# Patient Record
Sex: Female | Born: 1940 | Race: White | Hispanic: No | Smoking: Former smoker
Health system: Southern US, Community
[De-identification: ages and names within clinical notes are randomized; demographics above are authoritative.]

## PROBLEM LIST (undated history)

## (undated) DIAGNOSIS — I10 Essential (primary) hypertension: Secondary | ICD-10-CM

## (undated) DIAGNOSIS — K219 Gastro-esophageal reflux disease without esophagitis: Secondary | ICD-10-CM

## (undated) DIAGNOSIS — M8580 Other specified disorders of bone density and structure, unspecified site: Secondary | ICD-10-CM

## (undated) DIAGNOSIS — E119 Type 2 diabetes mellitus without complications: Secondary | ICD-10-CM

## (undated) DIAGNOSIS — C9110 Chronic lymphocytic leukemia of B-cell type not having achieved remission: Secondary | ICD-10-CM

## (undated) DIAGNOSIS — H269 Unspecified cataract: Secondary | ICD-10-CM

## (undated) DIAGNOSIS — C50919 Malignant neoplasm of unspecified site of unspecified female breast: Secondary | ICD-10-CM

## (undated) DIAGNOSIS — D72829 Elevated white blood cell count, unspecified: Secondary | ICD-10-CM

## (undated) HISTORY — DX: Essential (primary) hypertension: I10

## (undated) HISTORY — DX: Chronic lymphocytic leukemia of B-cell type not having achieved remission: C91.10

## (undated) HISTORY — DX: Unspecified cataract: H26.9

## (undated) HISTORY — DX: Gastro-esophageal reflux disease without esophagitis: K21.9

## (undated) HISTORY — DX: Other specified disorders of bone density and structure, unspecified site: M85.80

## (undated) HISTORY — PX: COLONOSCOPY: SHX174

## (undated) HISTORY — DX: Type 2 diabetes mellitus without complications: E11.9

## (undated) HISTORY — DX: Malignant neoplasm of unspecified site of unspecified female breast: C50.919

## (undated) HISTORY — PX: APPENDECTOMY: SHX54

## (undated) HISTORY — DX: Elevated white blood cell count, unspecified: D72.829

---

## 1981-12-02 DIAGNOSIS — C50919 Malignant neoplasm of unspecified site of unspecified female breast: Secondary | ICD-10-CM

## 1981-12-02 HISTORY — PX: AUGMENTATION MAMMAPLASTY: SUR837

## 1981-12-02 HISTORY — DX: Malignant neoplasm of unspecified site of unspecified female breast: C50.919

## 1981-12-02 HISTORY — PX: OTHER SURGICAL HISTORY: SHX169

## 1981-12-02 HISTORY — PX: MASTECTOMY: SHX3

## 2000-03-24 ENCOUNTER — Other Ambulatory Visit: Admission: RE | Admit: 2000-03-24 | Discharge: 2000-03-24 | Payer: Self-pay | Admitting: Gynecology

## 2000-04-04 ENCOUNTER — Encounter: Admission: RE | Admit: 2000-04-04 | Discharge: 2000-04-04 | Payer: Self-pay | Admitting: Gynecology

## 2000-04-04 ENCOUNTER — Encounter: Payer: Self-pay | Admitting: Gynecology

## 2005-04-01 ENCOUNTER — Ambulatory Visit: Payer: Self-pay | Admitting: Unknown Physician Specialty

## 2006-04-15 ENCOUNTER — Ambulatory Visit: Payer: Self-pay | Admitting: Internal Medicine

## 2006-08-20 IMAGING — MG UNKNOWN MG STUDY
1 series · 8 of 8 positions shown · non-contrast
Comparison: none

REASON FOR EXAM: History breast carcinoma
COMMENTS:

[R CC · right · 8 of 8 slices shown]
[im 1/8]
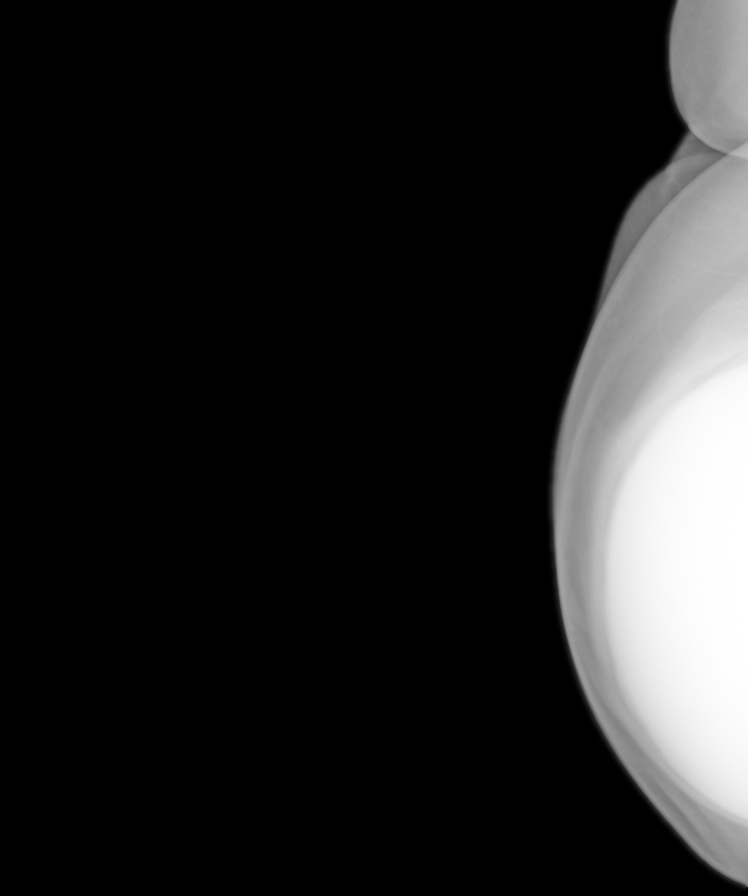
[im 2/8]
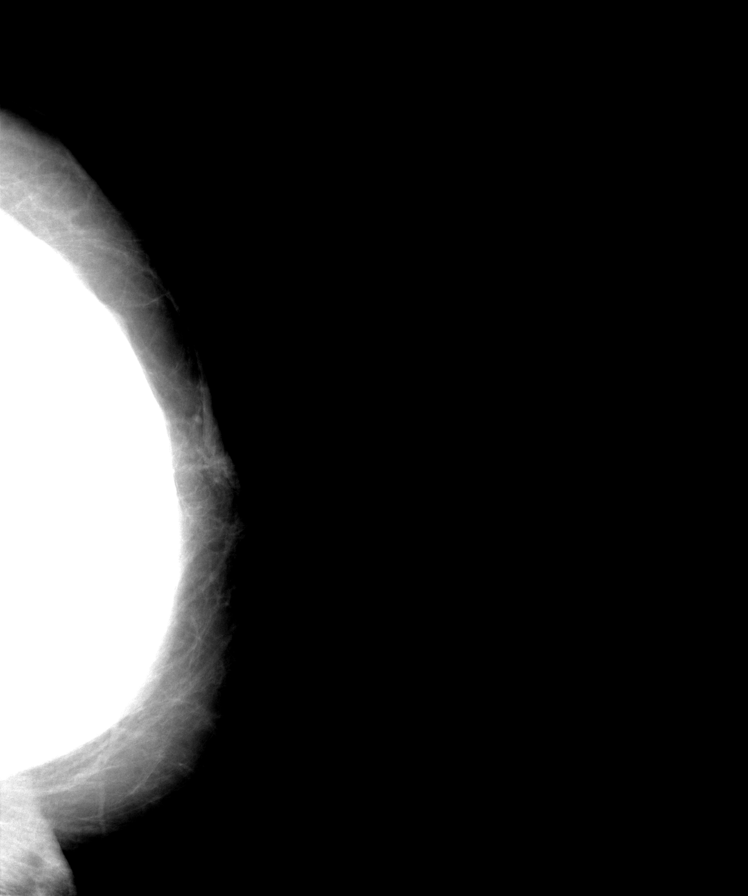
[im 3/8]
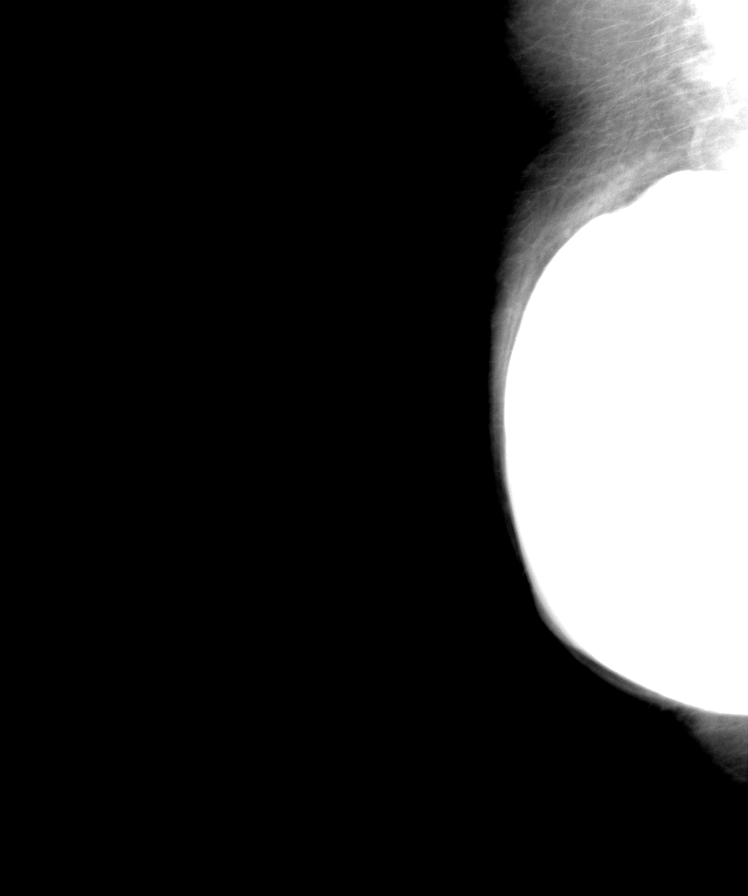
[im 4/8]
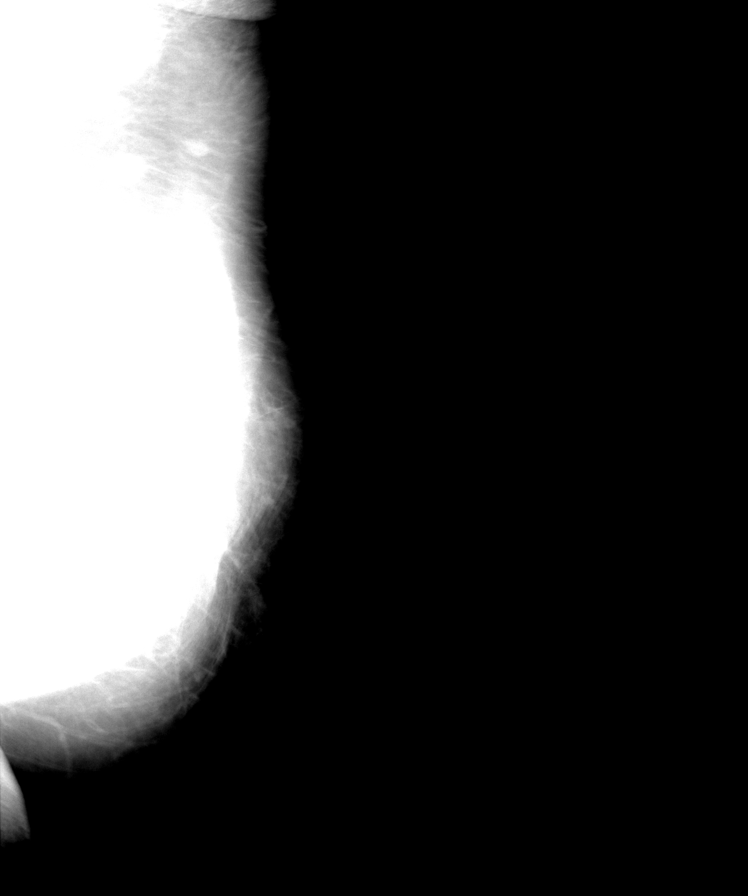
[im 5/8]
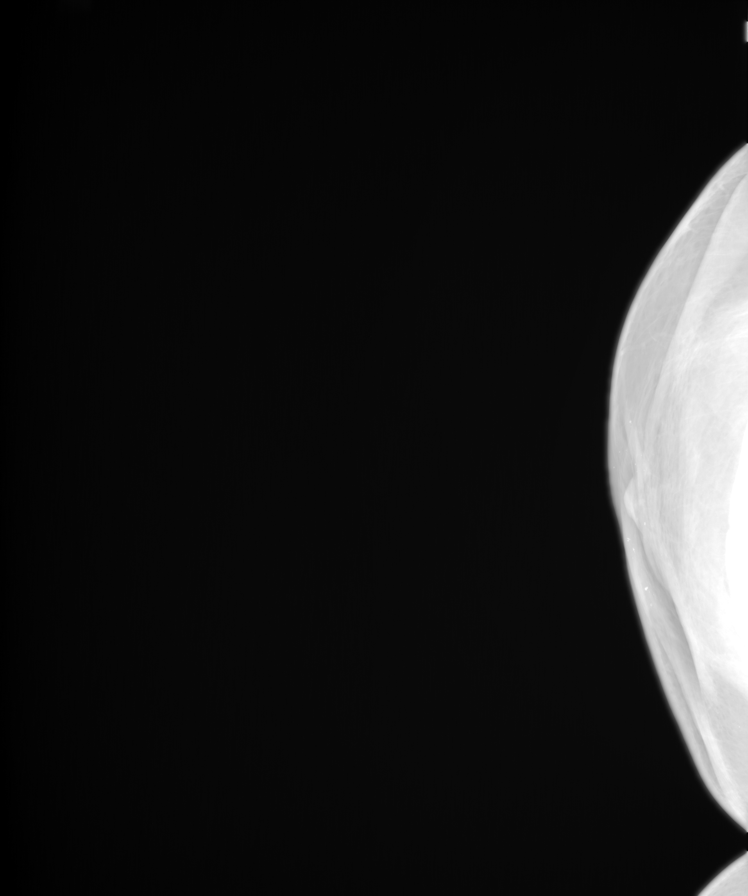
[im 6/8]
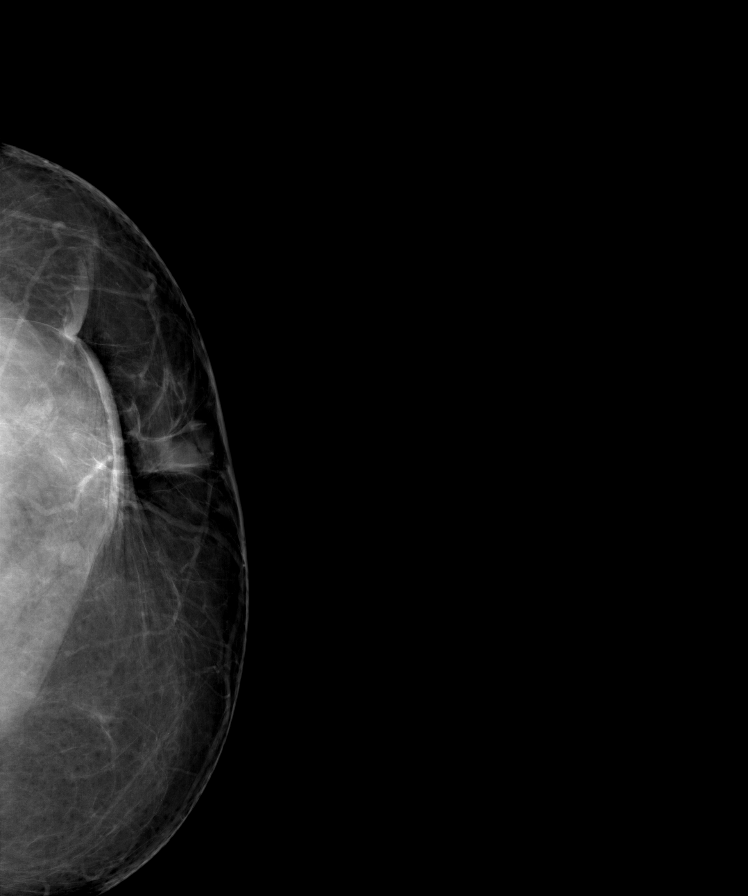
[im 7/8]
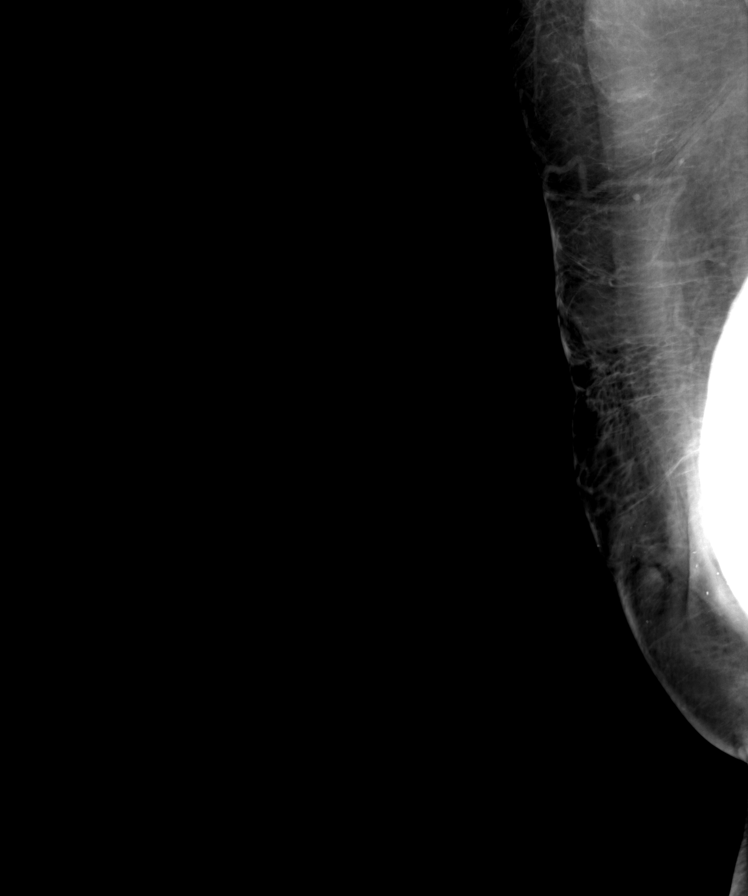
[im 8/8]
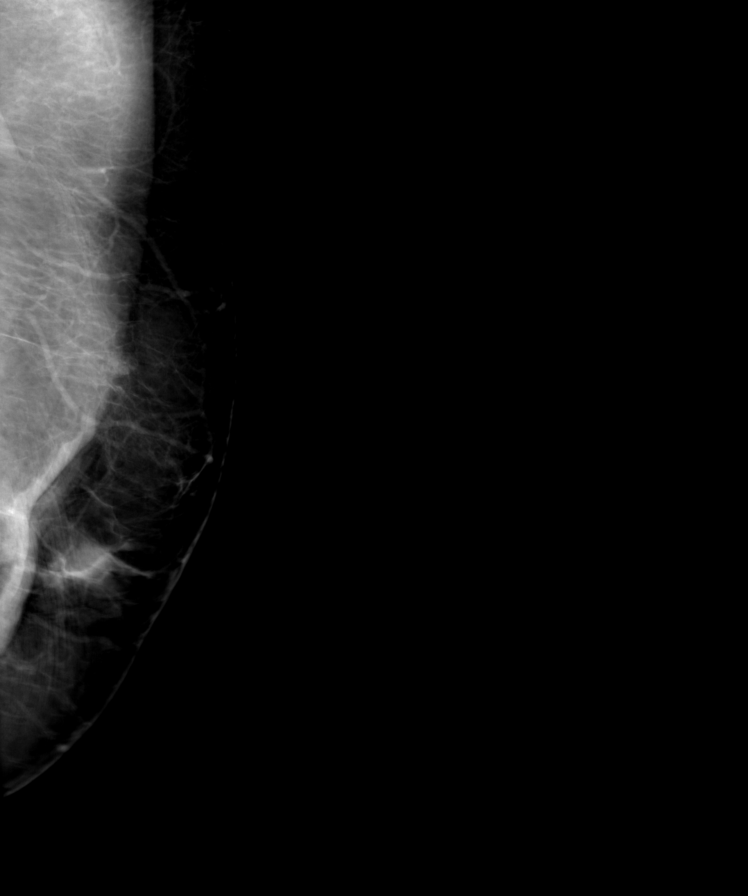

[8 of 8 positions shown; findings below may reference images not displayed]

PROCEDURE:     MAM - MAM DGTL DIAGNOSTIC MAMMO W/CAD  - April 17, 2006  [DATE]

RESULT:          The patient has undergone bilateral mastectomies with
implants and has a history of breast cancer.  No old studies are available
for comparison.

Implants are noted bilaterally.  Push-back and Arati views show minimal
density presumably in the area of the nipple on the LEFT.  No other
significant abnormality is seen.
IMPRESSION: Status post bilateral mastectomy with implants.  No
definite mammographic abnormality.  Please continue to encourage annual
mammographic followup.

BI-RADS:  Category 2 - Benign finding

A NEGATIVE MAMMOGRAM REPORT DOES NOT PRECLUDE BIOPSY OR OTHER EVALUATION OF
A CLINICALLY PALPABLE OR OTHERWISE SUSPICIOUS MASS OR LESION.  BREAST CANCER
MAY NOT BE DETECTED BY MAMMOGRAPHY IN UP TO 10% OF CASES.

## 2006-10-11 ENCOUNTER — Ambulatory Visit: Payer: Self-pay | Admitting: Internal Medicine

## 2007-02-15 IMAGING — MR MRI HEAD WITHOUT AND WITH CONTRAST
9 series · 48 of 48 positions shown · non-contrast
Comparison: none

REASON FOR EXAM: headaches     dizziness
COMMENTS:

[Series 2: t1_sag · axial · 10.0mm · 0.55mm/px · z∈[+0,+92]mm · 5 of 20 slices shown]
[im 1/20]
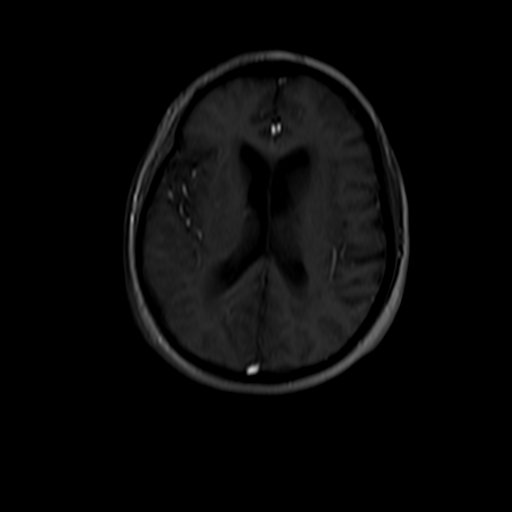
[im 5/20]
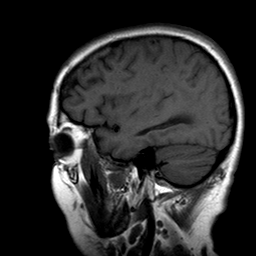
[im 10/20]
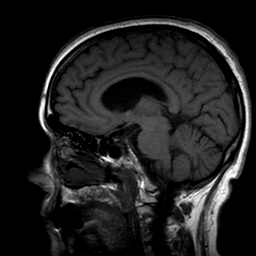
[im 15/20]
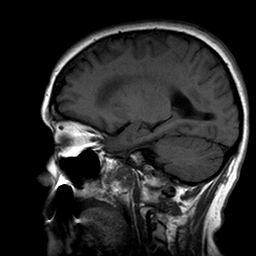
[im 20/20]
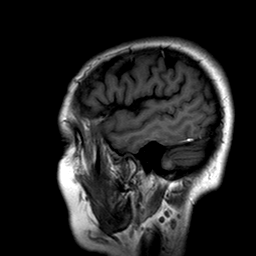

[Series 6: T1 · axial · 5.0mm · 0.90mm/px · z∈[-67,+88]mm · 6 of 24 slices shown (1 of 3)]
[im 1/24]
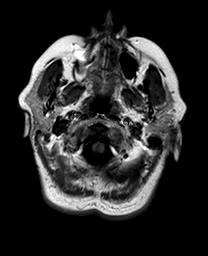
[im 5/24]
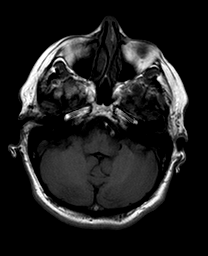
[im 10/24]
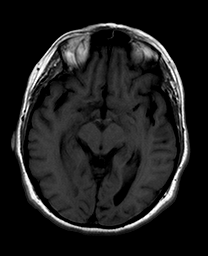
[im 14/24]
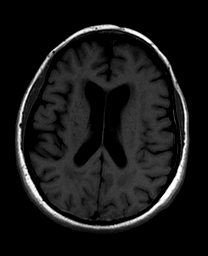
[im 19/24]
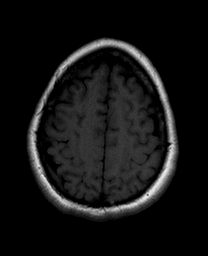
[im 24/24]
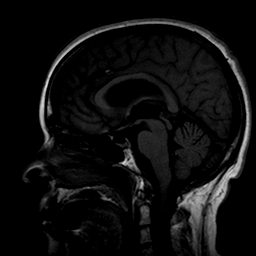

[Series 12: T2 · axial · 5.0mm · 0.45mm/px · z∈[-66,+88]mm · 5 of 24 slices shown]
[im 1/24]
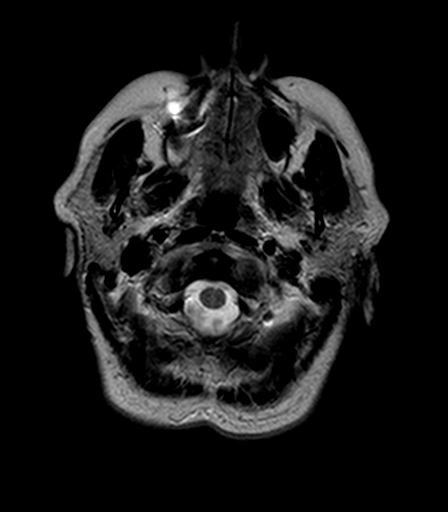
[im 6/24]
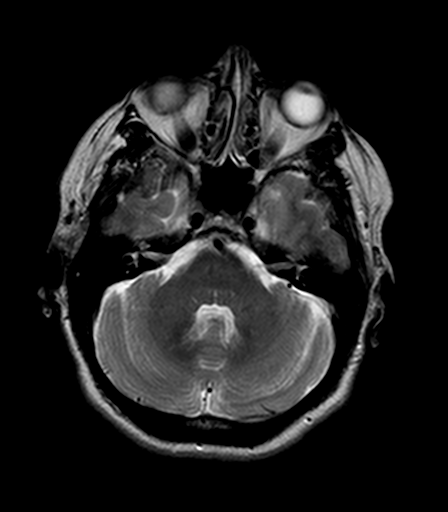
[im 12/24]
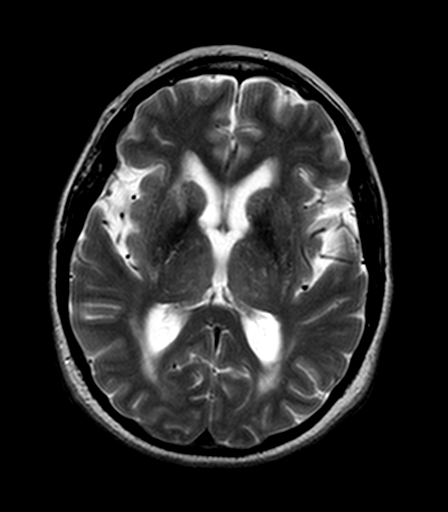
[im 18/24]
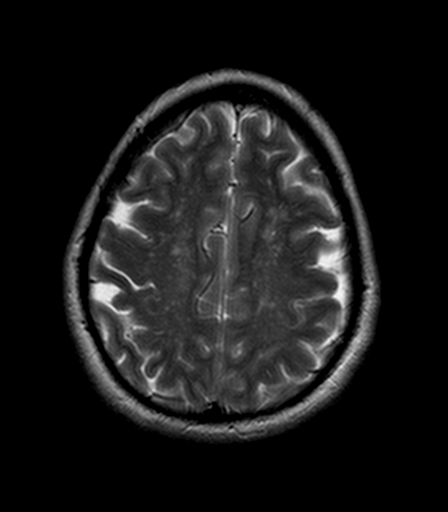
[im 24/24]
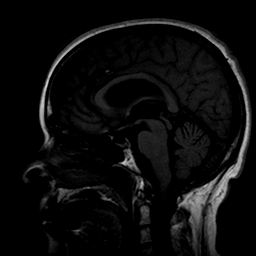

[Series 13: FLAIR · axial · 5.0mm · 0.90mm/px · z∈[-67,+88]mm · 5 of 24 slices shown]
[im 1/24]
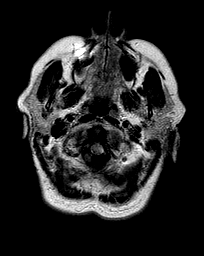
[im 6/24]
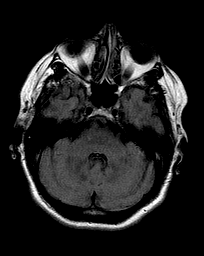
[im 12/24]
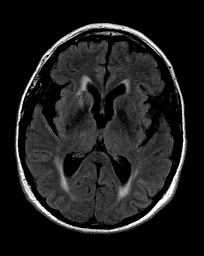
[im 18/24]
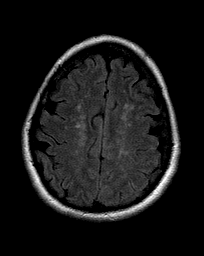
[im 24/24]
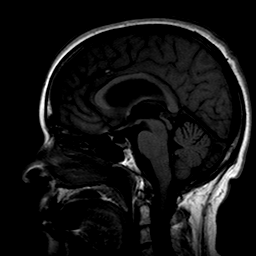

[Series 17: t1_cor · sagittal · 5.0mm · 0.90mm/px · 6 of 28 slices shown]
[im 1/28]
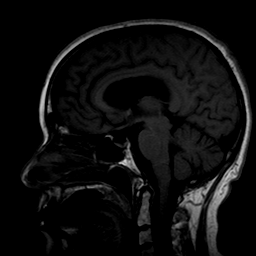
[im 6/28]
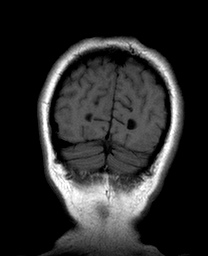
[im 11/28]
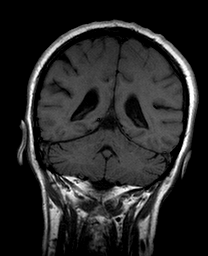
[im 17/28]
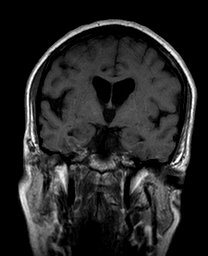
[im 22/28]
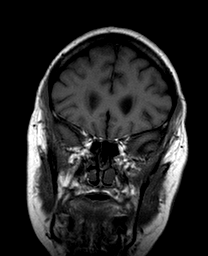
[im 28/28]
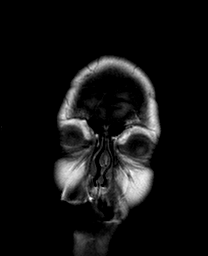

[Series 19: T1 · sagittal · 5.0mm · 0.90mm/px · 6 of 28 slices shown (2 of 3)]
[im 1/28]
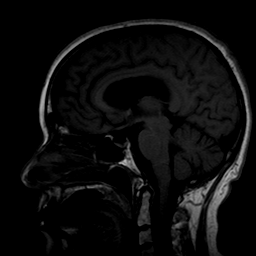
[im 6/28]
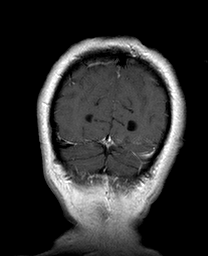
[im 11/28]
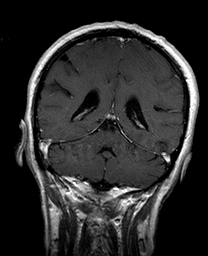
[im 17/28]
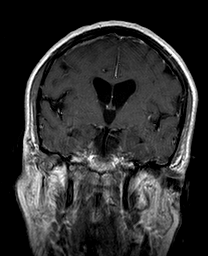
[im 22/28]
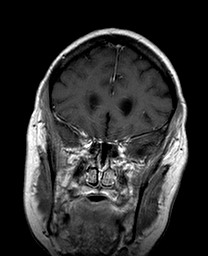
[im 28/28]
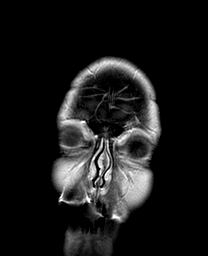

[Series 21: T1 · axial · 5.0mm · 0.45mm/px · z∈[-67,+88]mm · 5 of 24 slices shown (3 of 3)]
[im 1/24]
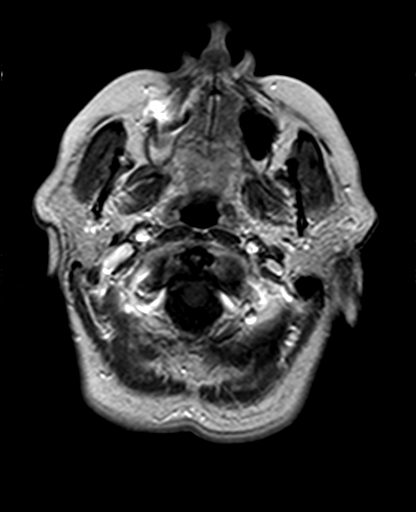
[im 6/24]
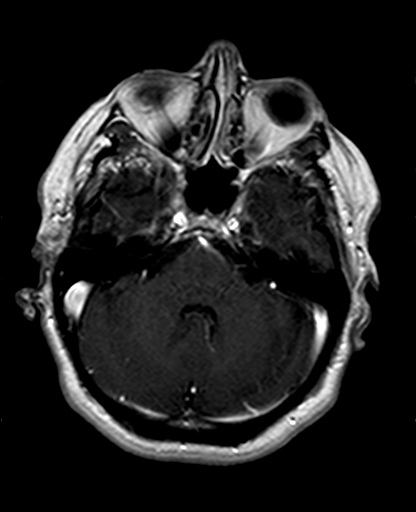
[im 12/24]
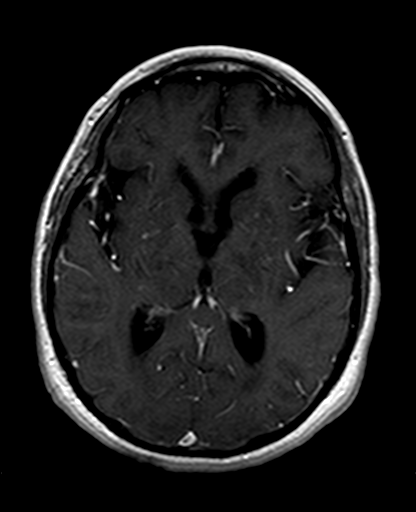
[im 18/24]
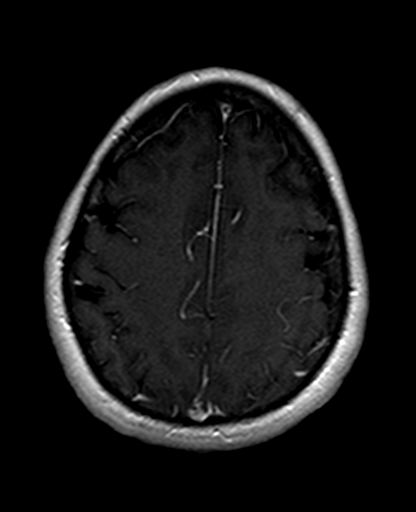
[im 24/24]
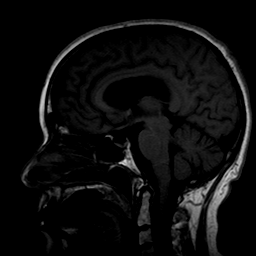

[Series 5003: ADC · axial · 5.0mm · 1.80mm/px · z∈[-59,+88]mm · 5 of 22 slices shown]
[im 1/22]
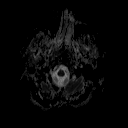
[im 6/22]
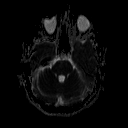
[im 11/22]
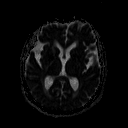
[im 16/22]
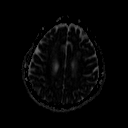
[im 22/22]
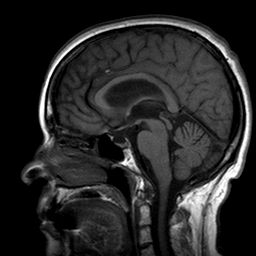

[Series 5004: DWI · axial · 5.0mm · 1.80mm/px · z∈[-59,+88]mm · 5 of 22 slices shown]
[im 1/22]
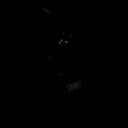
[im 6/22]
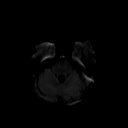
[im 11/22]
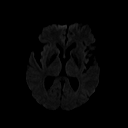
[im 16/22]
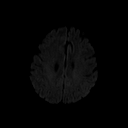
[im 22/22]
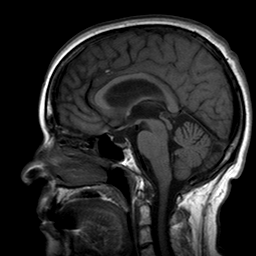

[48 of 48 positions shown; findings below may reference images not displayed]

PROCEDURE:     MR  - MR BRAIN WO/W CONTRAST  - October 11, 2006 [DATE]

RESULT:          Pre and post gadolinium MR imaging of the brain is
performed.  The patient has no prior similar study for comparison.

The diffusion-weighted images demonstrate no evidence of abnormal signal to
suggest an acute or subacute ischemic event.  The FLAIR-sequence images show
some punctate areas of increased signal to the RIGHT of midline in the pons
and in the periventricular and subcortical white matter regions.  These are
nonspecific.  These are also seen on the T2-weighted images, although not as
prominently.  These areas do not show significant decreased signal on the
T1-weighted images, and there is no evidence of abnormal enhancement
following the intravenous administration of gadolinium.  Again, there is no
evidence of hemorrhage or blood breakdown product signal.
IMPRESSION: Nonspecific T2 and FLAIR-sequence changes consistent
with chronic microvascular ischemic disease.  This is in the periventricular
white matter, subcortical white matter, and pontine region.

## 2007-06-01 ENCOUNTER — Ambulatory Visit: Payer: Self-pay | Admitting: Internal Medicine

## 2007-06-29 ENCOUNTER — Ambulatory Visit: Payer: Self-pay | Admitting: Cardiovascular Disease

## 2007-10-06 IMAGING — MG MAM DGTL DIAGNOSTIC MAMMO W/CAD
1 series · 8 of 8 positions shown · non-contrast
Comparison: none

REASON FOR EXAM: implants
COMMENTS:

PROCEDURE:     MAM - MAM DGTL DIAGNOSTIC MAMMO W/CAD  - June 01, 2007  [DATE]
RESULT:       Bilateral breast implants are present.  No parenchymal
abnormalities are identified.  Breast implants appear stable.  Small
axillary lymph nodes are present bilaterally.

[Series 4707: R CC · right · 8 of 10 slices shown]
[im 1/10]
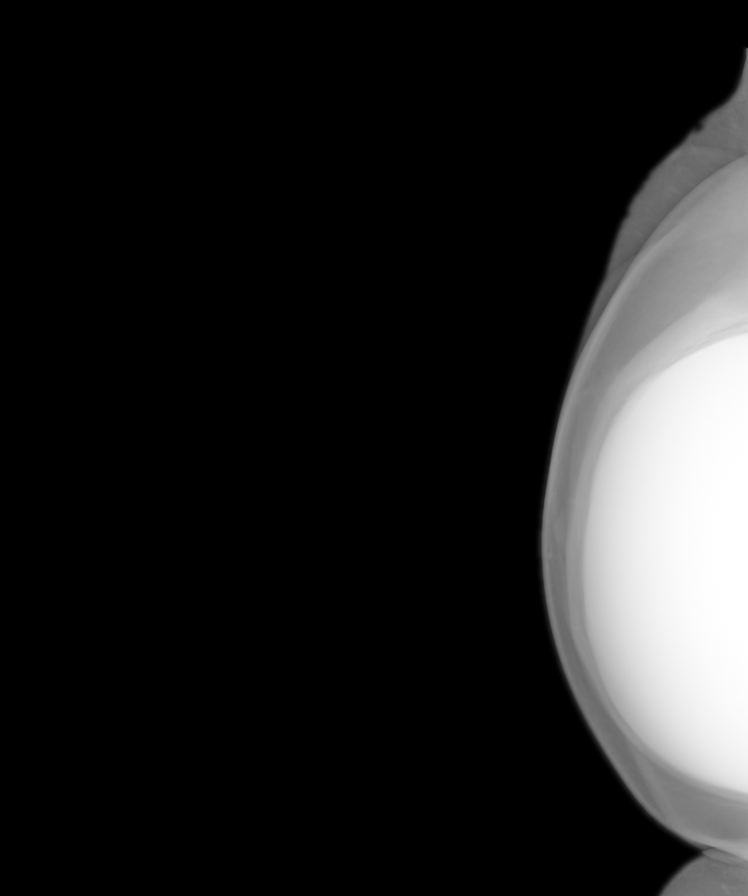
[im 2/10]
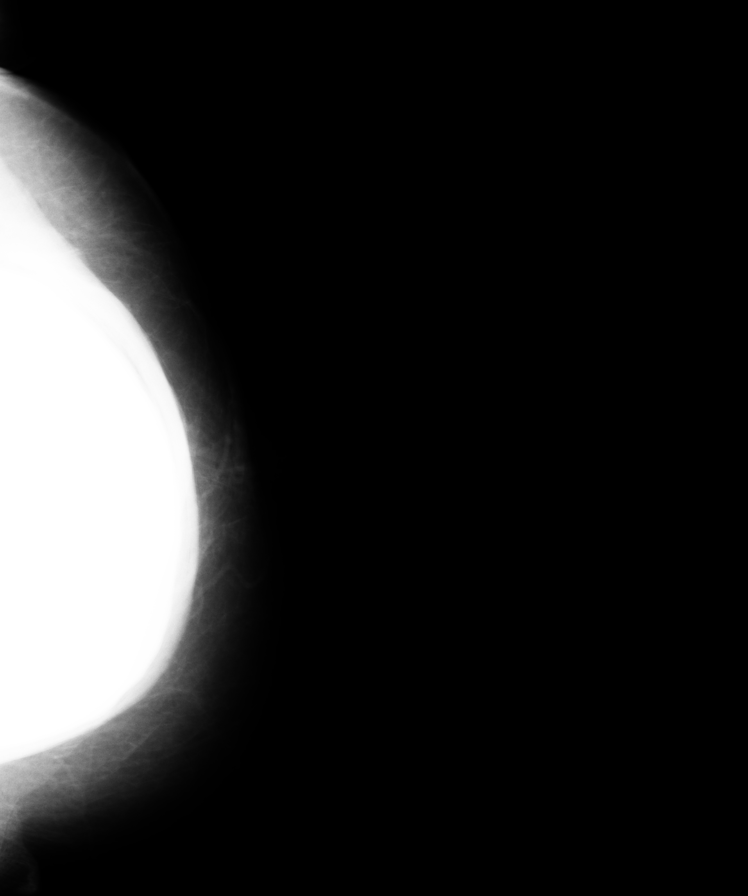
[im 3/10]
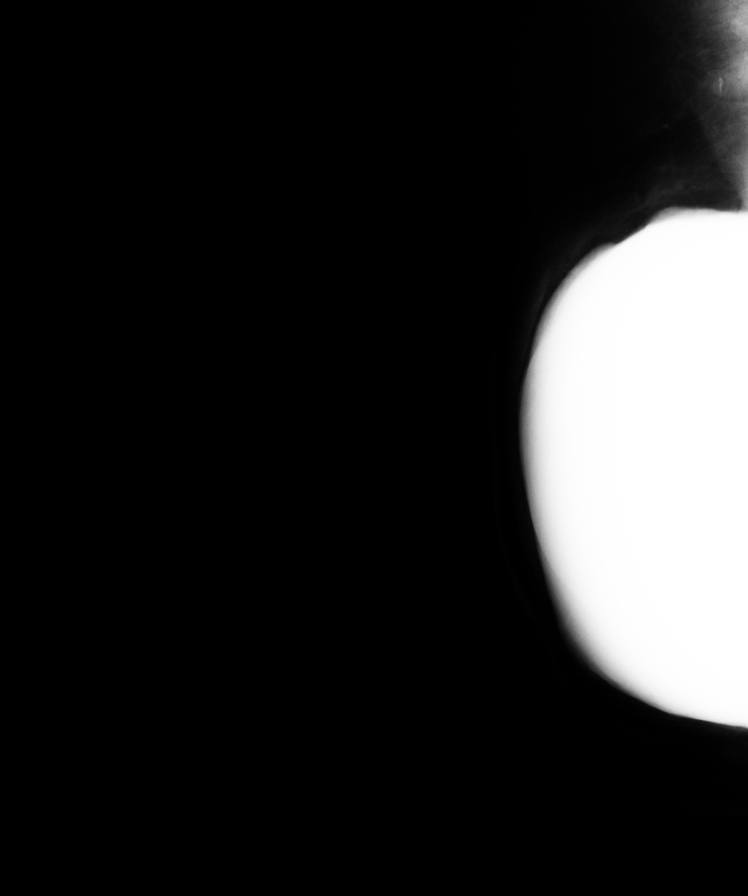
[im 4/10]
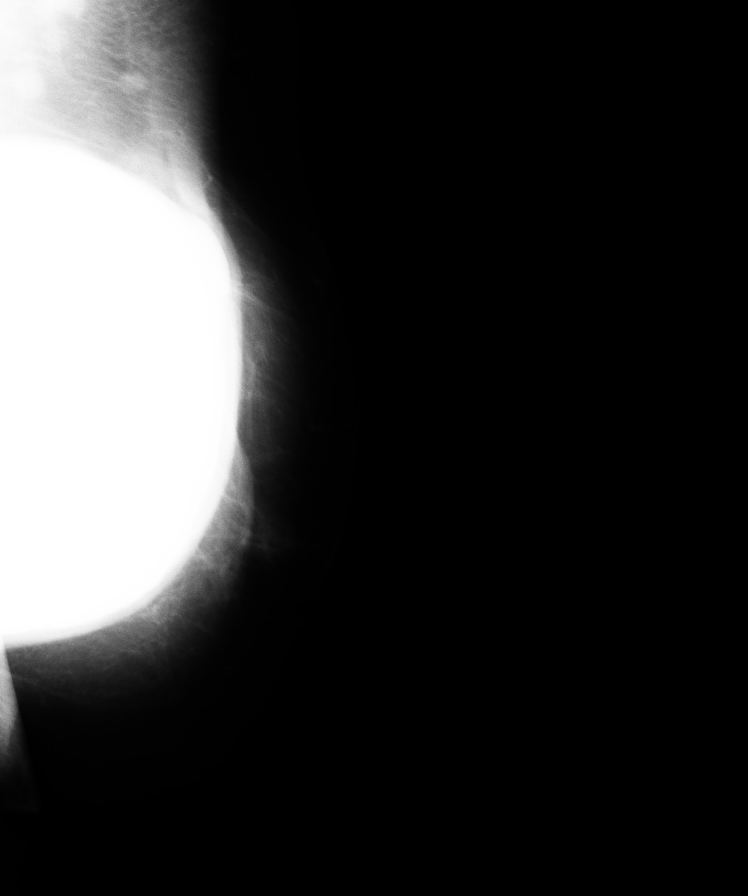
[im 6/10]
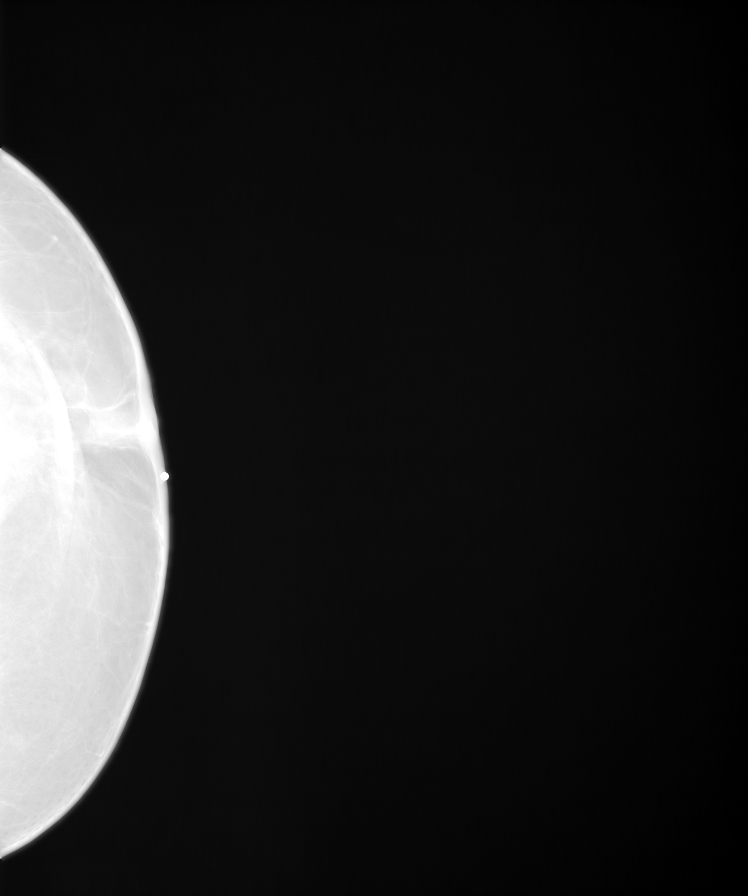
[im 7/10]
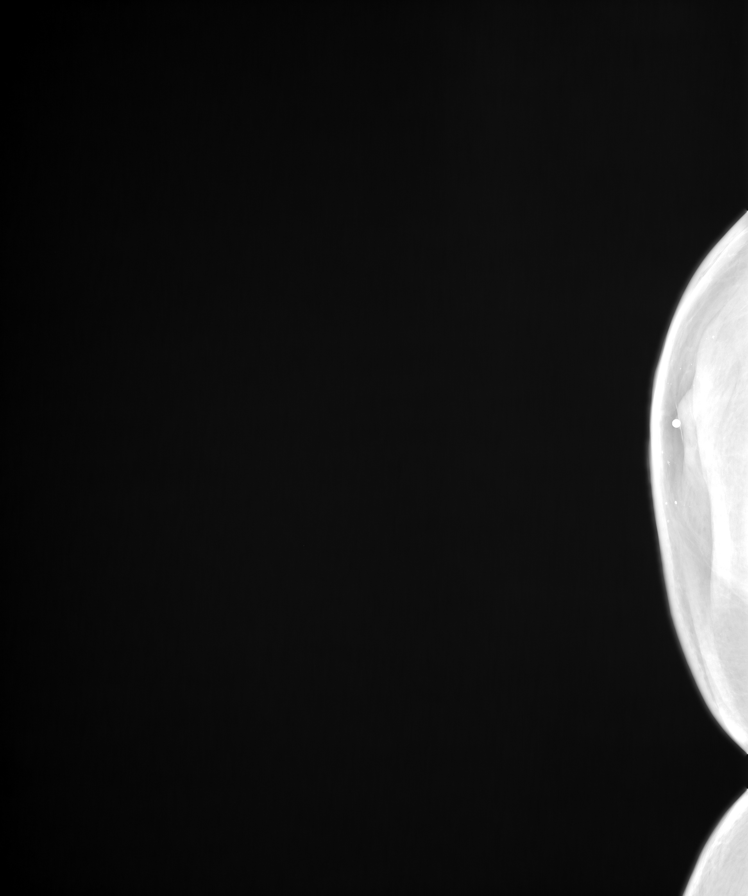
[im 8/10]
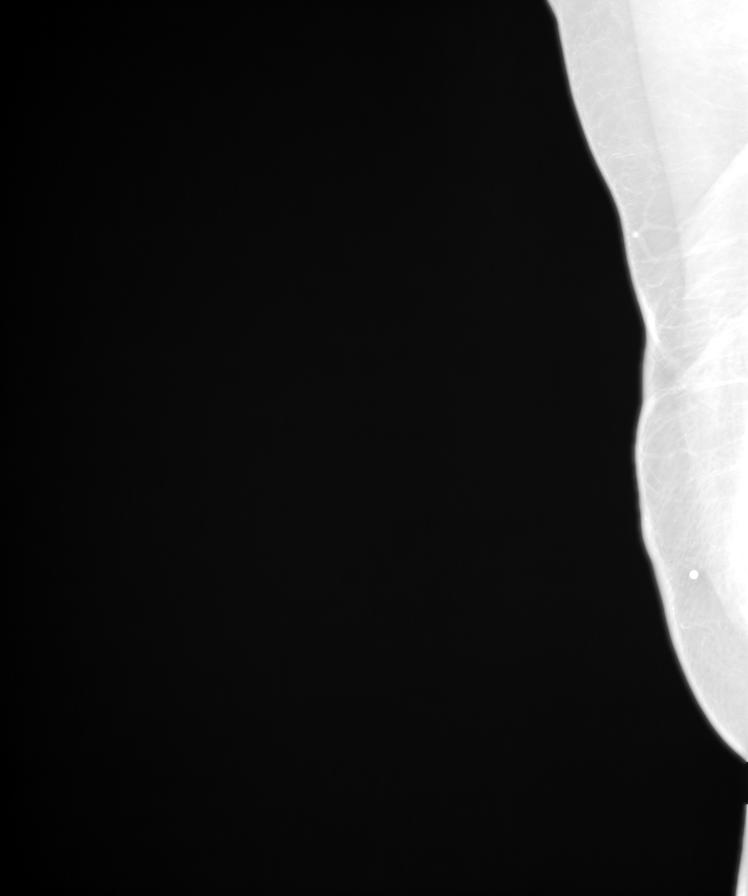
[im 10/10]
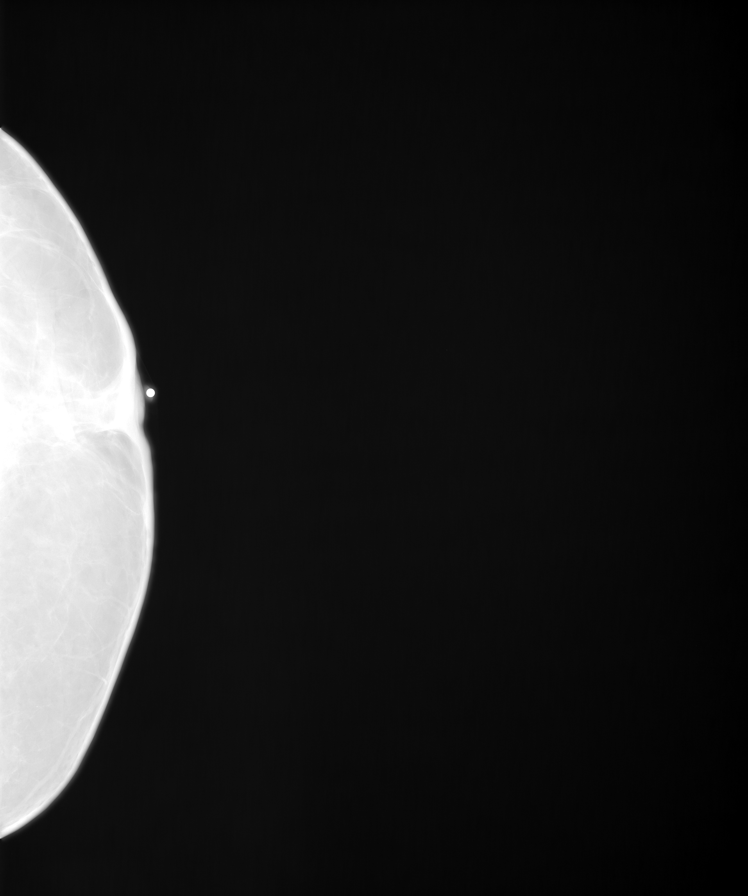

[8 of 8 positions shown; findings below may reference images not displayed]

IMPRESSION: 1.     Benign exam.
2.     Yearly follow up mammogram is suggested.
3.     BI-RADS:  Category 2-Benign Finding.

A NEGATIVE MAMMOGRAM REPORT DOES NOT PRECLUDE BIOPSY OR OTHER EVALUATION OF
A CLINICALLY PALPABLE OR OTHERWISE SUPSICIOUS MASS OR LESION.  BREAST CANCER
MAY NOT BE DETECTED BY MAMMOGRAPHY IN UP TO 10% OF CASES.

## 2008-08-09 ENCOUNTER — Ambulatory Visit: Payer: Self-pay | Admitting: Unknown Physician Specialty

## 2008-12-14 ENCOUNTER — Ambulatory Visit: Payer: Self-pay | Admitting: Internal Medicine

## 2009-04-10 ENCOUNTER — Ambulatory Visit: Payer: Self-pay | Admitting: Internal Medicine

## 2009-04-20 IMAGING — MG MAM DGTL DIAGNOSTIC MAMMO W/CAD
1 series · 8 of 8 positions shown · non-contrast
Comparison: none

REASON FOR EXAM: Left breast tenderness tiny nodule 7 oclock position
COMMENTS:

[R CC · right · 8 of 8 slices shown]
[im 1/8]
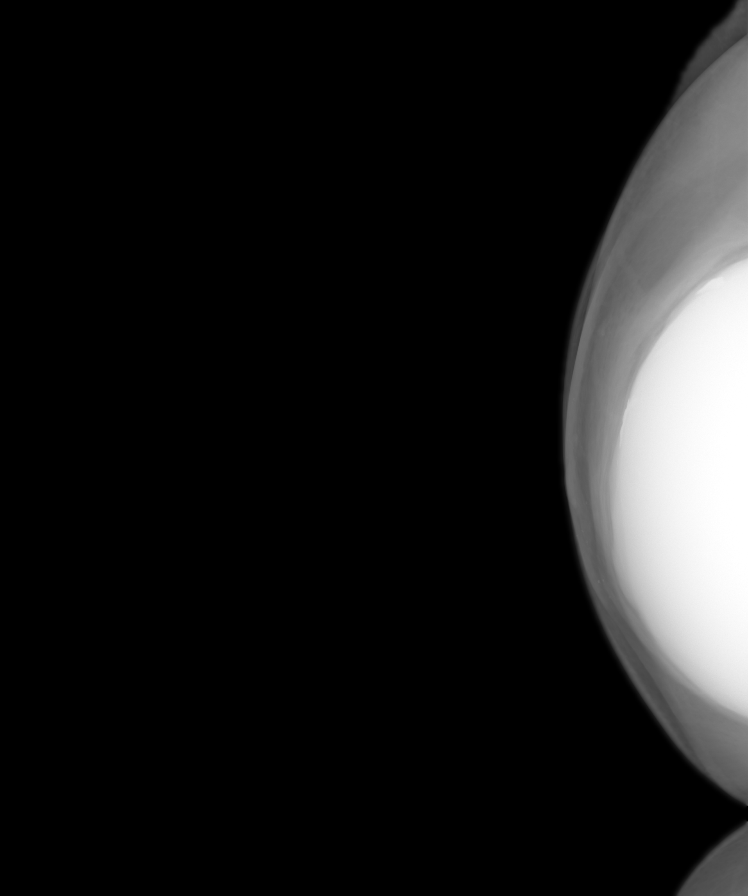
[im 2/8]
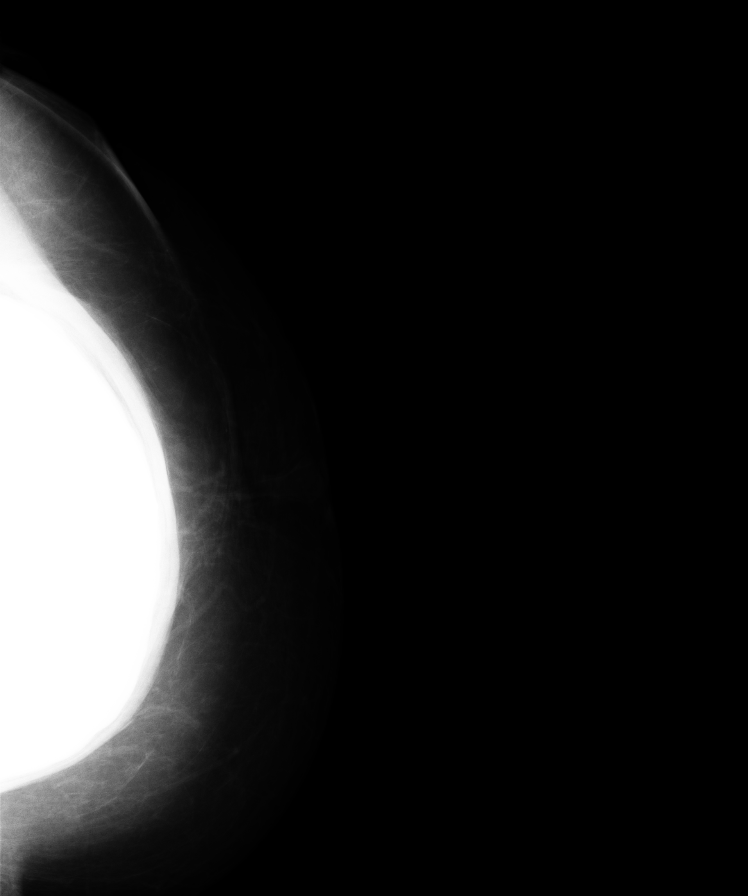
[im 3/8]
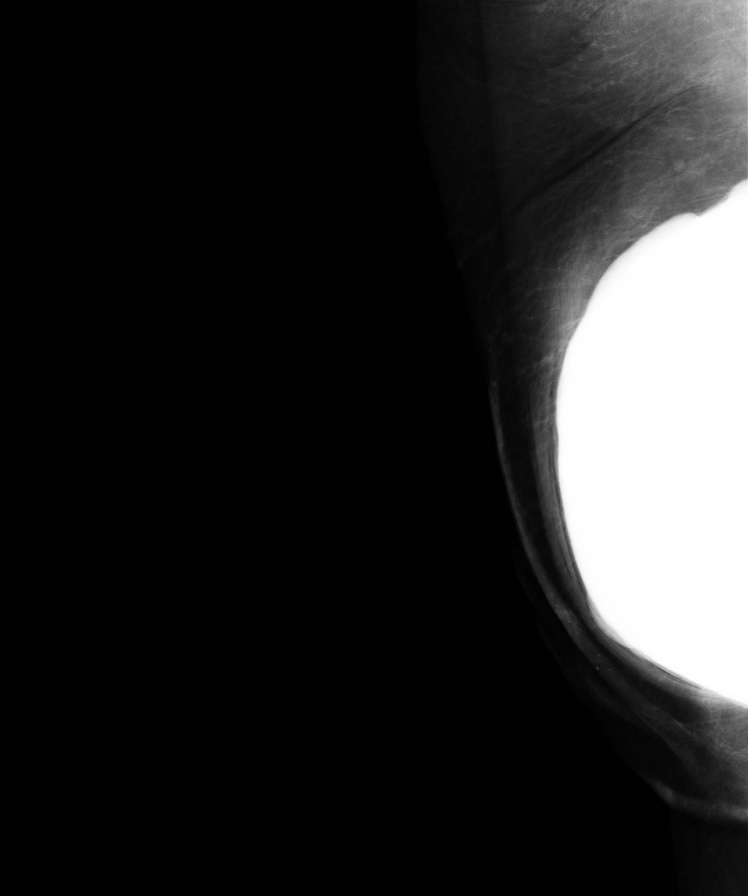
[im 4/8]
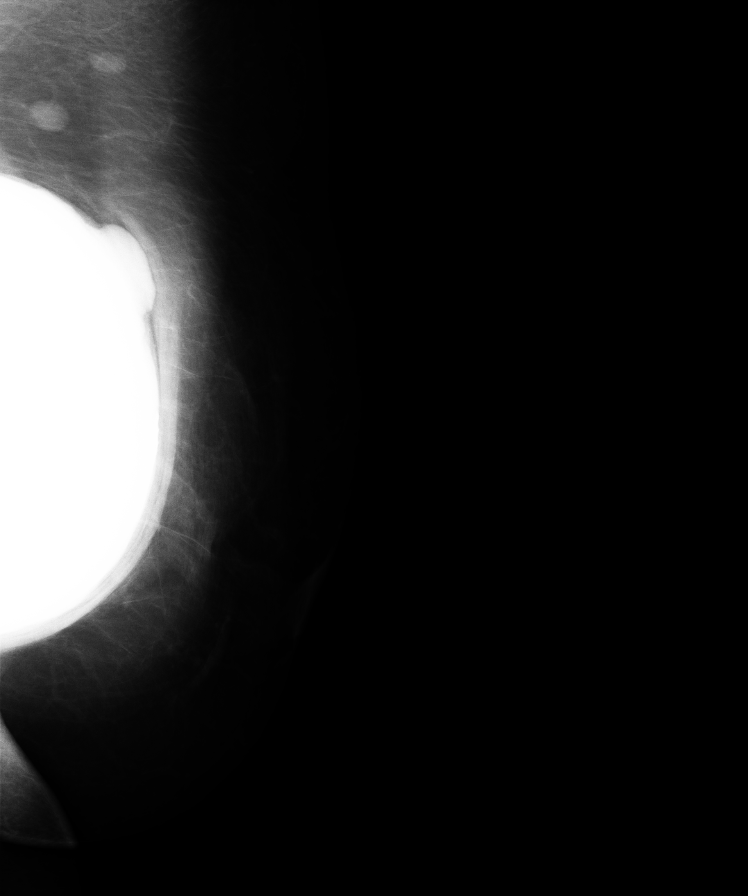
[im 5/8]
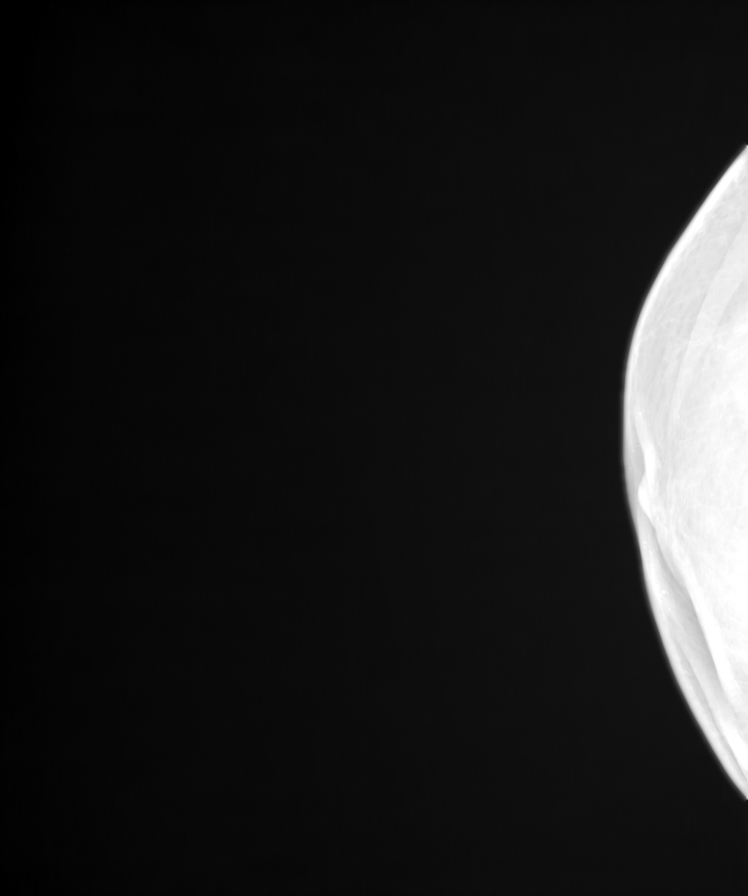
[im 6/8]
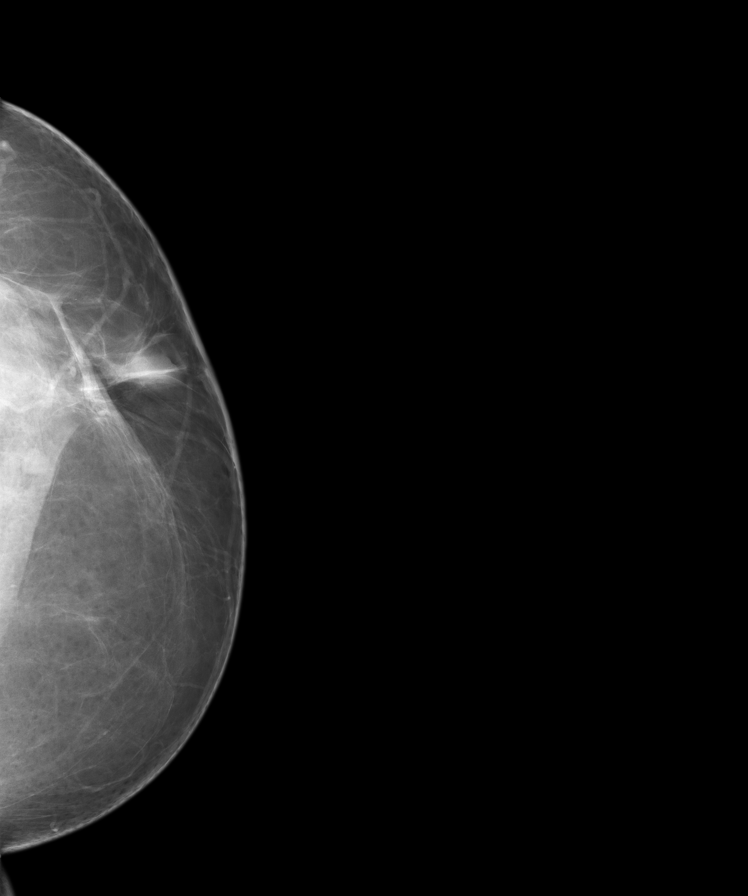
[im 7/8]
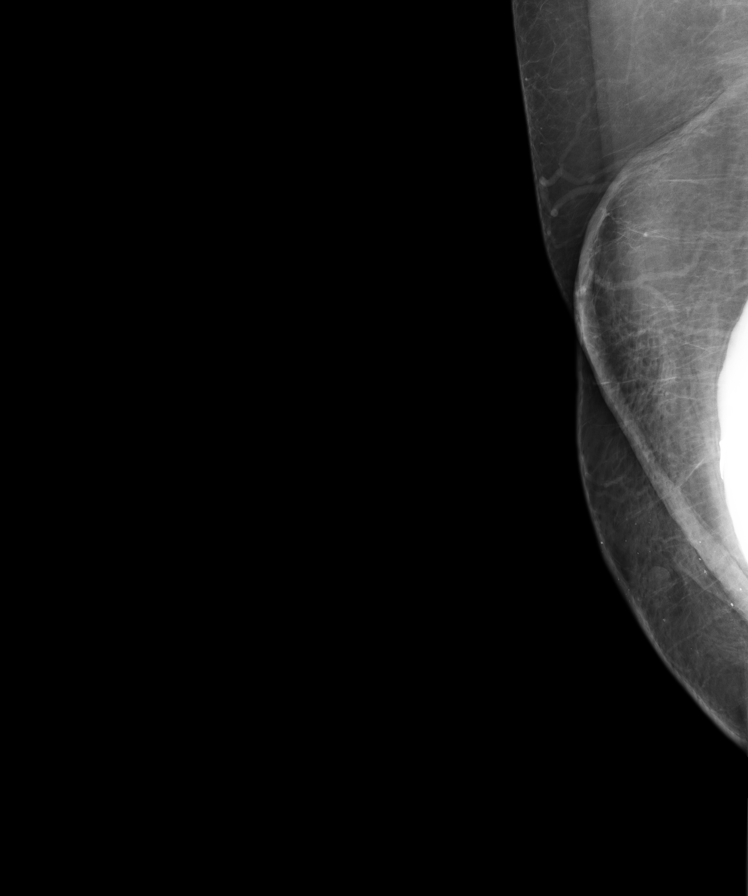
[im 8/8]
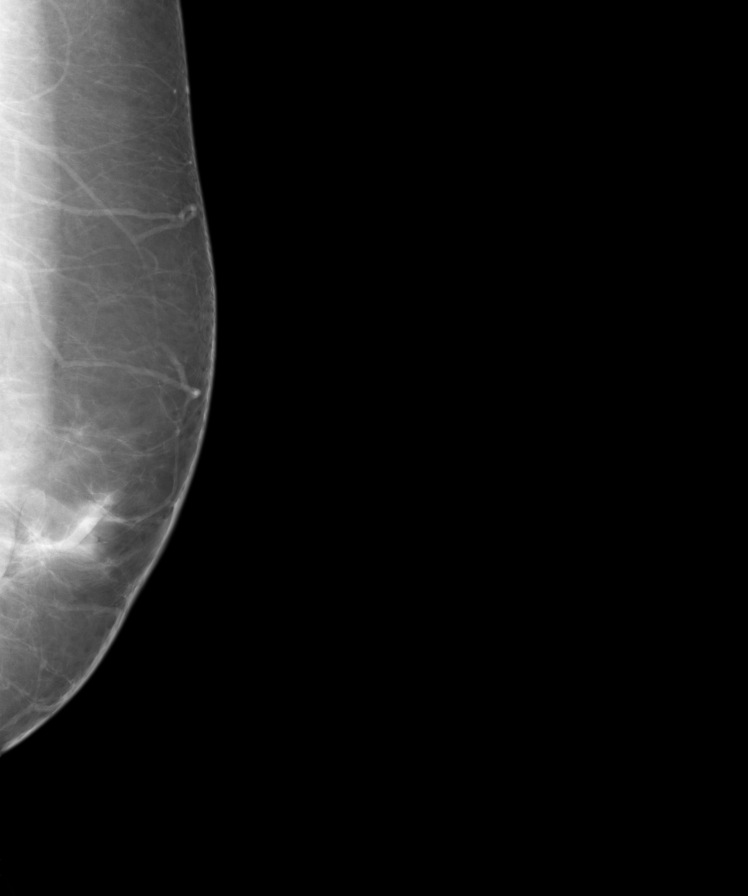

[8 of 8 positions shown; findings below may reference images not displayed]

PROCEDURE:     MAM - MAM DGTL DIAGNOSTIC MAMMO W/CAD  - December 14, 2008 [DATE]

RESULT:       Comparison is made to previous film screen images from
[REDACTED] dated 04/25/03 and to previous digital
exams dated 06/01/07.  Bilateral breast implants are present.  The LEFT
breast implant on the CC projection laterally shows some irregularity
possibly secondary to disruption of the implant.  Follow up and correlation
with physical findings is recommended.  Pushback or Arbouw view images show
no definite mammographic soft tissue abnormality within either breast.  No
malignant-appearing calcifications are evident.

Targeted ultrasound in the 7 to 9 o'clock region of the LEFT breast
demonstrates a somewhat elliptical area measuring 5.5 x 3.7 x 5.1 mm with
some internal echoes possibly debris. Does this correlate with the area of
possible capsular disruption? No definite through transmission is seen.
Clinical correlation and follow up is recommended.  No definite malignant
features are present.  Surgical consultation is recommended to evaluate for
capsular disruption.  6-month followup of the area of sonographic
abnormality is recommended.
IMPRESSION: 1.     BI-RADS: Category 3- Probably Benign Finding (interval follow up).
2.     Probable capsular disruption.
3.     Nonspecific predominantly cystic structure with internal echoes in
the LEFT breast which appears to be medial and not in the same area.  No
malignant features are present but followup is recommended. This is at the 9
o'clock region.

A NEGATIVE MAMMOGRAM REPORT DOES NOT PRECLUDE BIOPSY OR OTHER EVALUATION OF
A CLINICALLY PALPABLE OR OTHERWISE SUSPICIOUS MASS OR LESION.  BREAST CANCER
MAY NOT BE DETECTED BY MAMMOGRAPHY IN UP TO 10% OF CASES.

## 2009-04-20 IMAGING — US ULTRASOUND LEFT BREAST
1 series · 11 of 11 positions shown · non-contrast
Comparison: none

REASON FOR EXAM: Left breast tenderness tiny nodule 7 oclock position
COMMENTS:

[Series 1: ultrasound left breast · 11 of 11 slices shown]
[im 1/11]
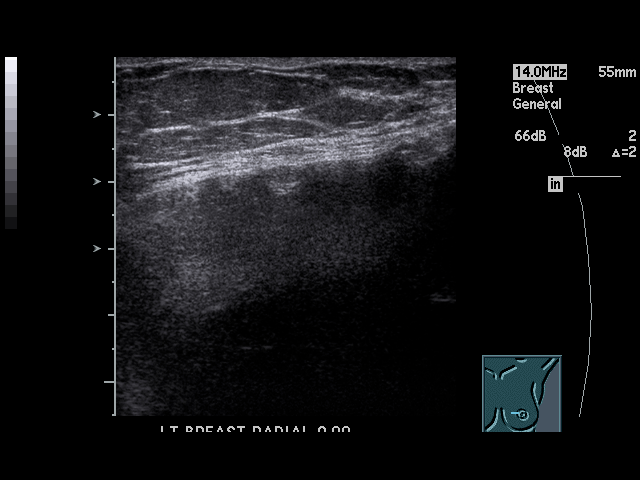
[im 2/11]
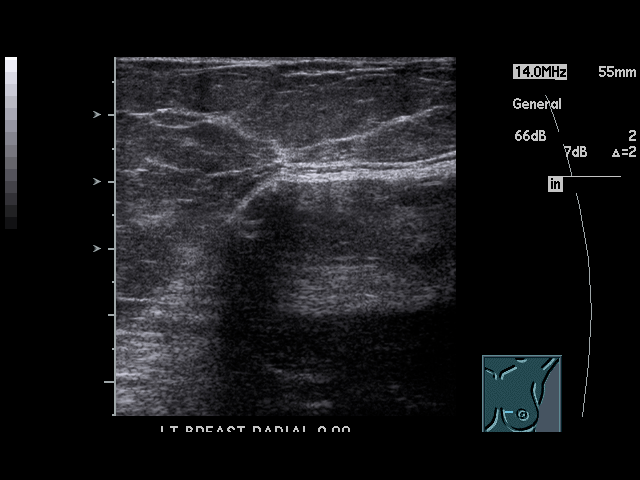
[im 3/11]
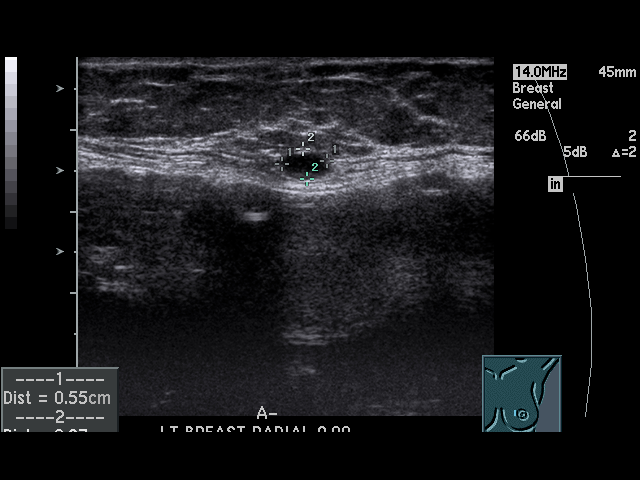
[im 4/11]
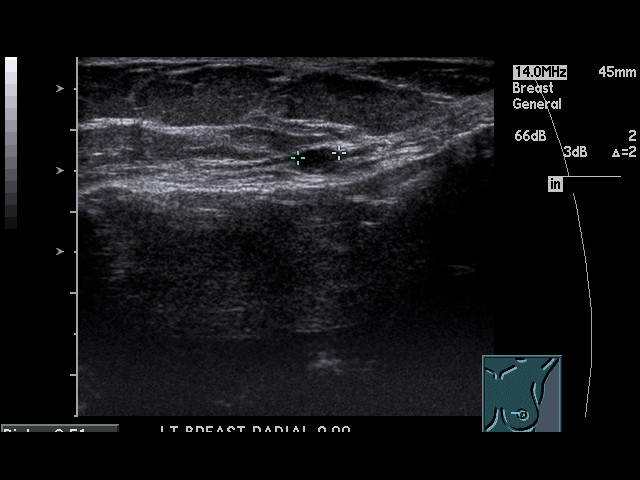
[im 5/11]
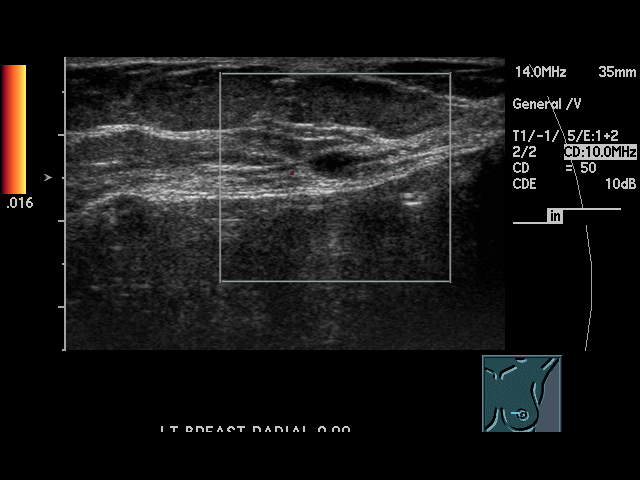
[im 6/11]
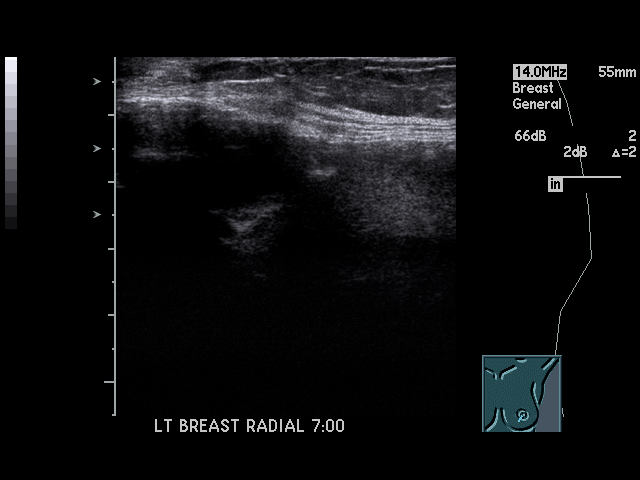
[im 7/11]
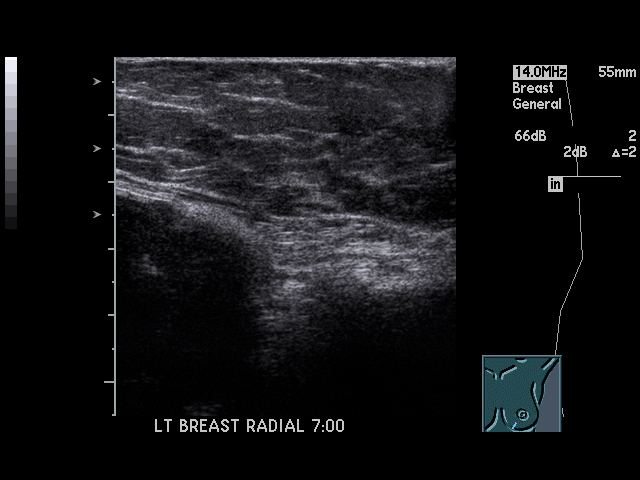
[im 8/11]
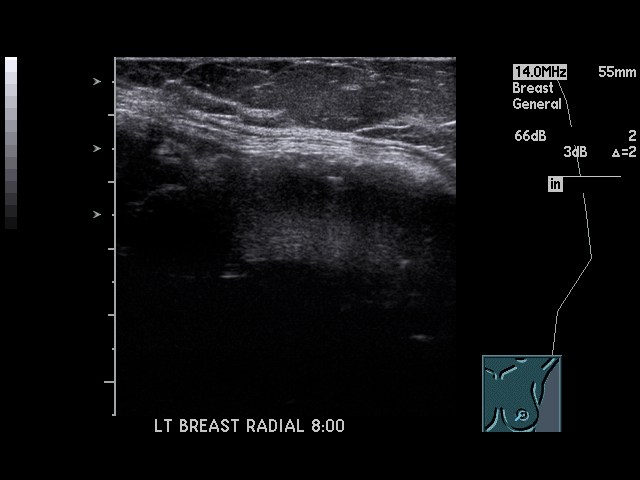
[im 9/11]
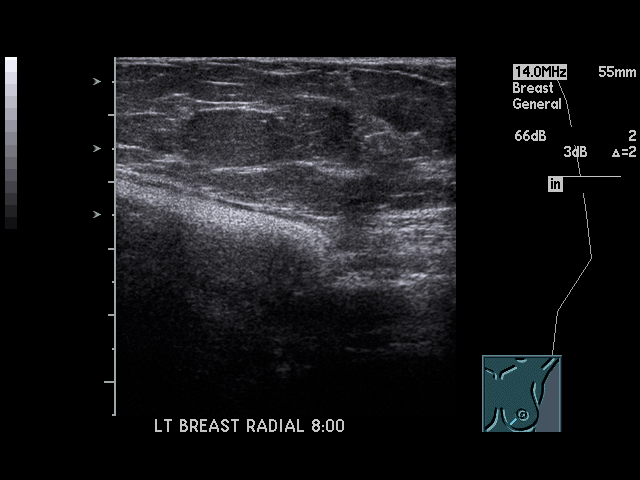
[im 10/11]
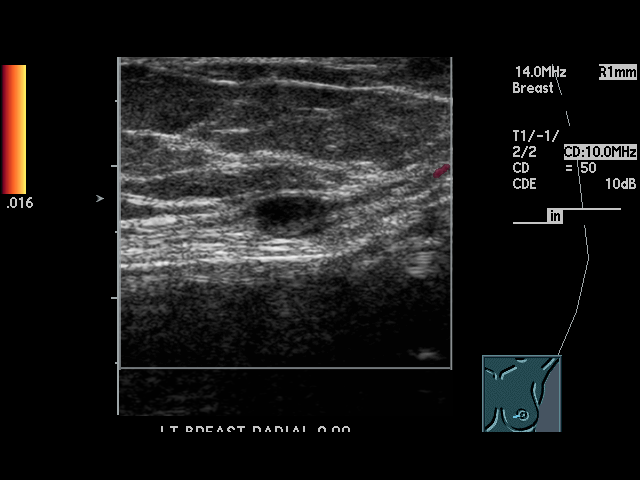
[im 11/11]
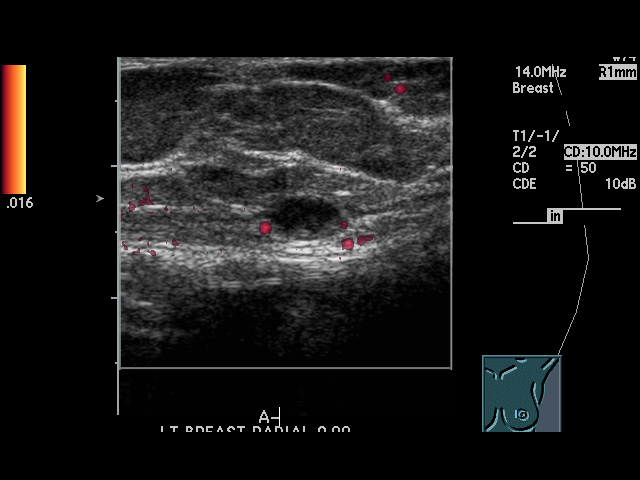

[11 of 11 positions shown; findings below may reference images not displayed]

PROCEDURE:     US  - US BREAST LEFT  - December 14, 2008 [DATE]

RESULT:

Targeted ultrasound in the 7 to 9 o'clock region of the LEFT breast
demonstrates a somewhat elliptical area measuring 5.5 x 3.7 x 5.1 mm with
some internal echoes possibly debris. Does this correlate with the area of
possible capsular disruption? No definite through transmission is seen.
Clinical correlation and follow up is recommended.  No definite malignant
features are present.  Surgical consultation is recommended to evaluate for
capsular disruption.  6-month followup of the area of sonographic
abnormality is recommended.
IMPRESSION: 1.     BI-RADS: Category 3- Probably Benign Finding (interval follow up).
2.     Probable capsular disruption.
3.     Nonspecific predominantly cystic structure with internal echoes in
the LEFT breast which appears to be medial and not in the same area.  No
malignant features are present but followup is recommended. This is at the 9
o'clock region.

## 2009-07-12 ENCOUNTER — Ambulatory Visit: Payer: Self-pay | Admitting: Unknown Physician Specialty

## 2009-08-15 IMAGING — NM NUCLEAR MEDICINE HEPATOHBILIARY INCLUDE GB
2 series · 12 of 12 positions shown · non-contrast
Comparison: none

REASON FOR EXAM: RUQ abd pain  nausea
COMMENTS:

[Series 1000: gallbladder dynamic · 4.80mm/px · 6 of 60 frames shown]
[frame 6/60]
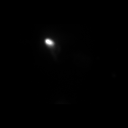
[frame 16/60]
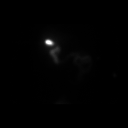
[frame 26/60]
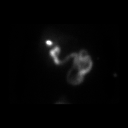
[frame 36/60]
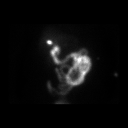
[frame 46/60]
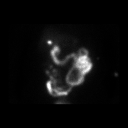
[frame 56/60]
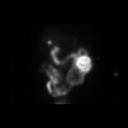

[Series 1000: gallbladder dynamic (results) · 4.80mm/px · 6 of 60 frames shown]
[frame 6/60]
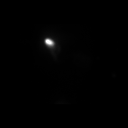
[frame 16/60]
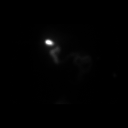
[frame 26/60]
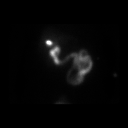
[frame 36/60]
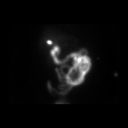
[frame 46/60]
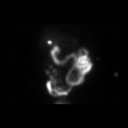
[frame 56/60]
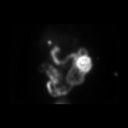

[12 of 12 positions shown; findings below may reference images not displayed]

PROCEDURE:     NM  - NM HEPATO WITH GB EJECT FRACTION  - April 10, 2009 [DATE]

RESULT:     The patient received 7.01 mCi of technetium 99m labeled
Choletec. The patient also received an intravenous drip infusion of 1.5 mcg
of sincalide over 30 minutes.

There is adequate uptake of the radiopharmaceutical by the liver. The common
bile duct is visible by 10 minutes. The gallbladder is visible by 15
minutes. Appearance of the bowel activity was somewhat later and
approximately 85 minutes. The 30 minute gallbladder ejection fraction is
normal at 98%.
IMPRESSION: Normal hepatobiliary scan with normal gallbladder ejection
fraction.

## 2010-02-22 ENCOUNTER — Ambulatory Visit: Payer: Self-pay | Admitting: Internal Medicine

## 2010-05-30 ENCOUNTER — Ambulatory Visit: Payer: Self-pay | Admitting: Internal Medicine

## 2010-06-29 IMAGING — MG MAM DGTL DIAGNOSTIC MAMMO W/CAD
1 series · 8 of 8 positions shown · non-contrast
Comparison: none

REASON FOR EXAM: hx br ca   bil IMPLANTS
COMMENTS:

[Series 6531: R CC · right · 8 of 10 slices shown]
[im 1/10]
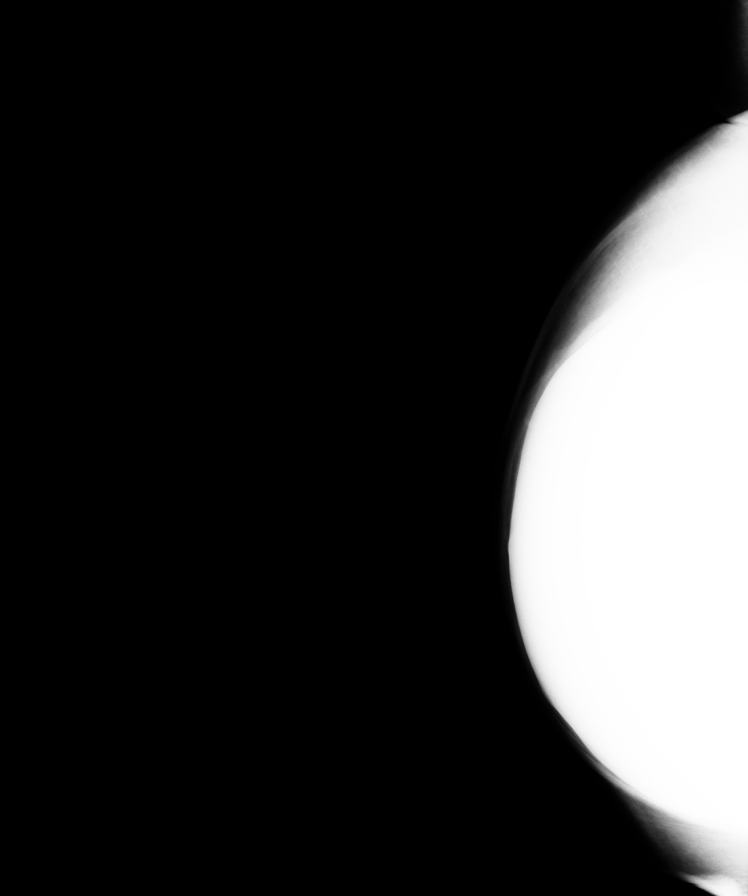
[im 2/10]
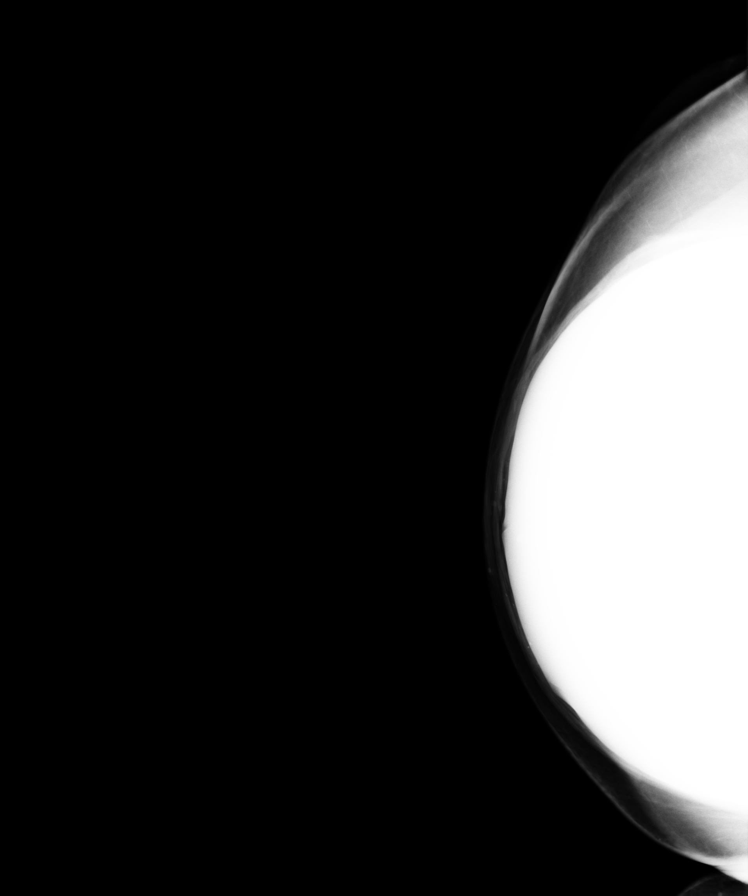
[im 3/10]
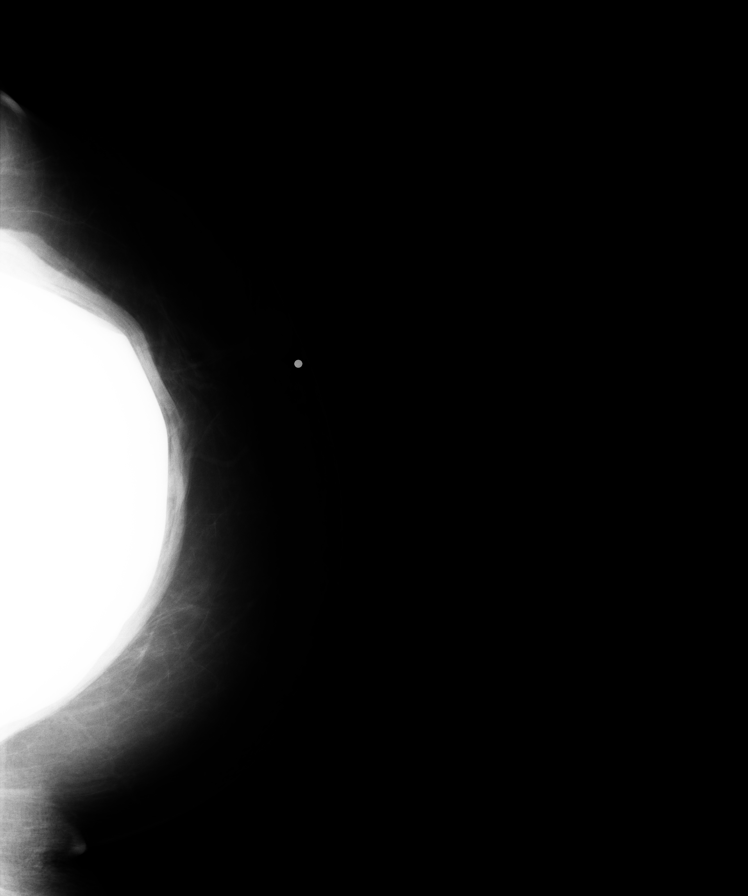
[im 4/10]
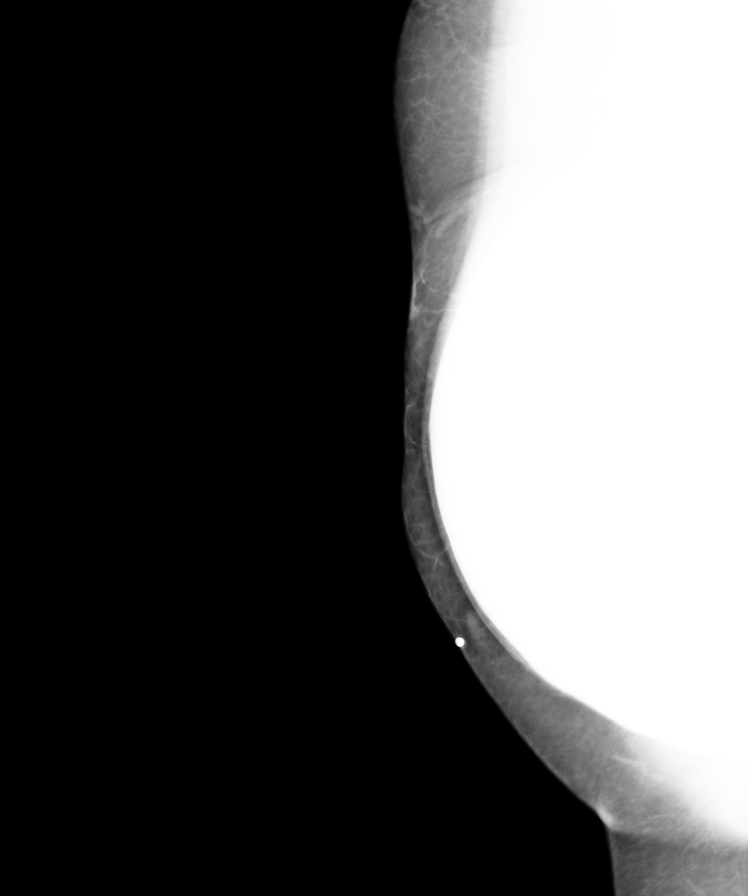
[im 6/10]
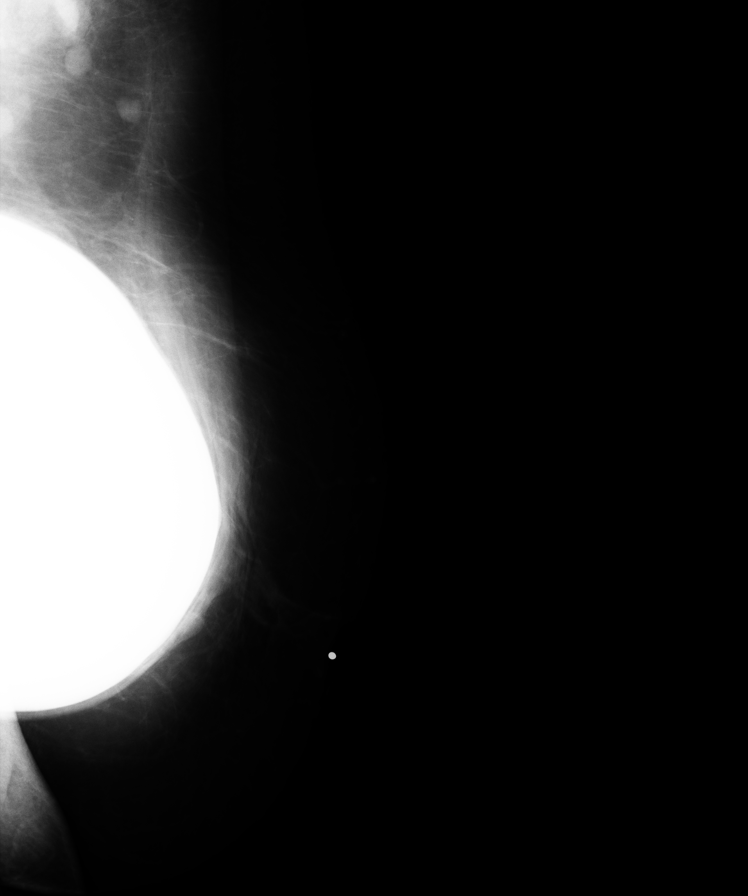
[im 7/10]
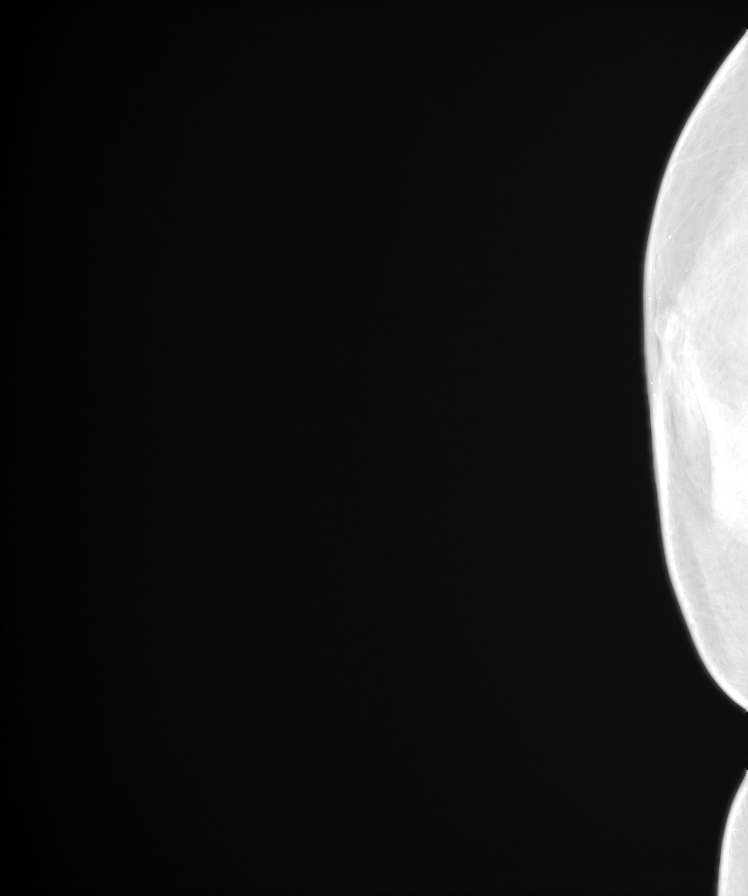
[im 8/10]
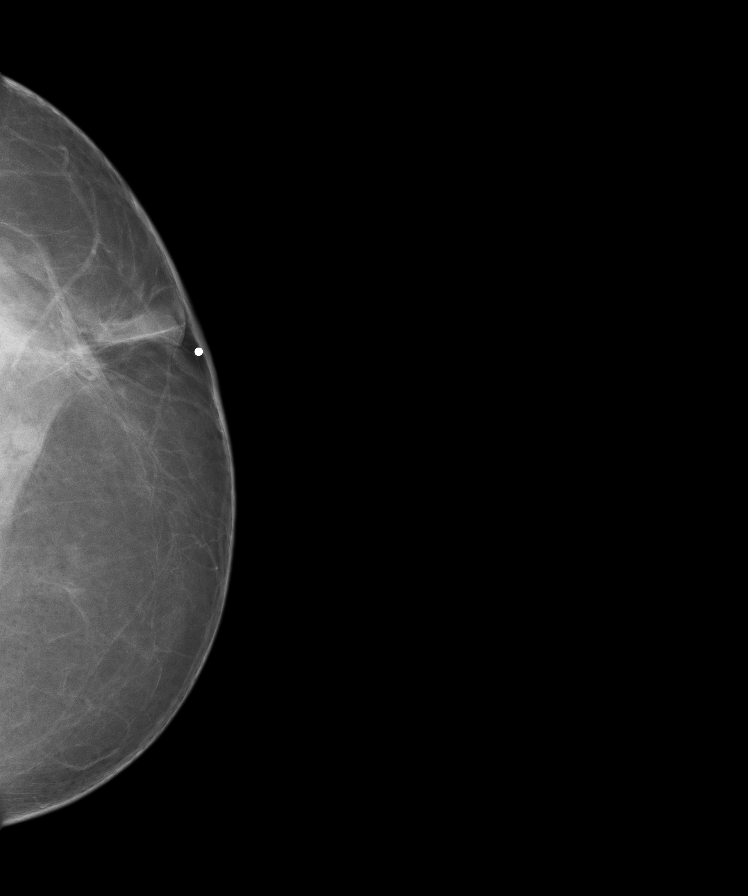
[im 10/10]
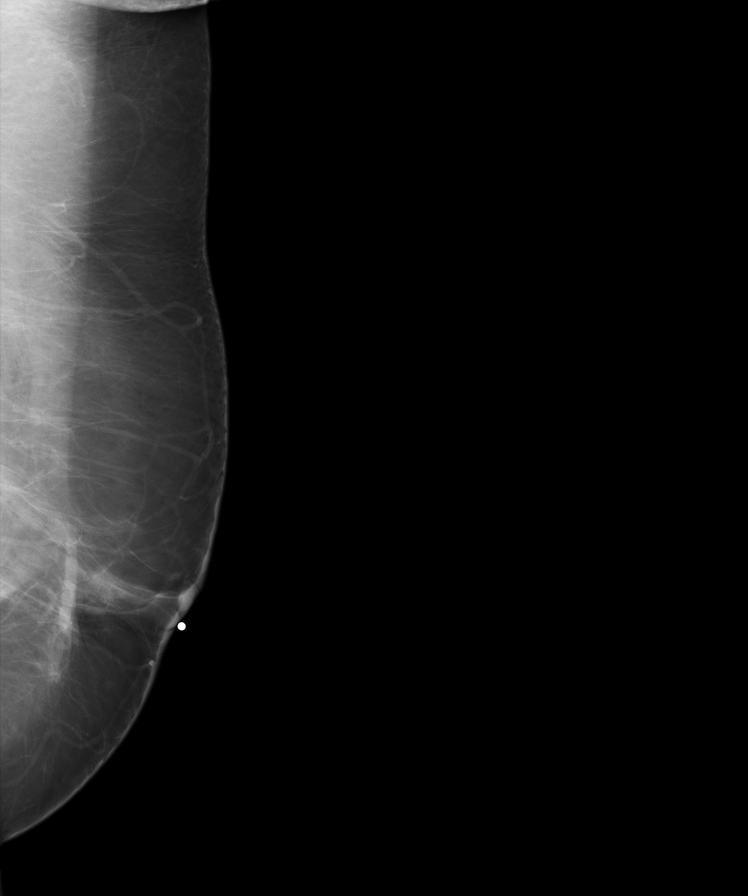

[8 of 8 positions shown; findings below may reference images not displayed]

PROCEDURE:     MAM - MAM DGTL DIAGNOSTIC MAMMO W/CAD  - February 22, 2010  [DATE]

RESULT:     The patient has breast implants which reportedly were replaced
in Friday January, 2009.

Standard and pushback views were obtained. There is a change in the
appearance of the left breast implant since the previous study. It appears
somewhat larger than the right. The right breast implant exhibits a somewhat
irregular surface but a focal bulge demonstrated on the previous study is no
longer evident given that the implant has been replaced.  I do not see
evidence of implant rupture either on the right or left. The pushback views
reveal no suspicious findings.
IMPRESSION: 1.I do not see evidence of implant rupture nor of malignancy.

BI-RADS: Category 2 - Benign Findings

RECOMMENDATIONS:

1.     Please continue to encourage yearly mammographic follow-up.

A NEGATIVE MAMMOGRAM REPORT DOES NOT PRECLUDE BIOPSY OR OTHER EVALUATION OF
A CLINICALLY PALPABLE OR OTHERWISE SUSPICIOUS MASS OR LESION. BREAST CANCER
MAY NOT BE DETECTED BY MAMMOGRAPHY IN UP TO 10% OF CASES.

## 2010-10-04 IMAGING — US ABDOMEN ULTRASOUND
1 series · 17 of 25 positions shown · non-contrast
Comparison: none

REASON FOR EXAM: RUQ abd pain
COMMENTS:

[Series 1: abdomen ultrasound · 17 of 68 slices shown]
[im 1/68]
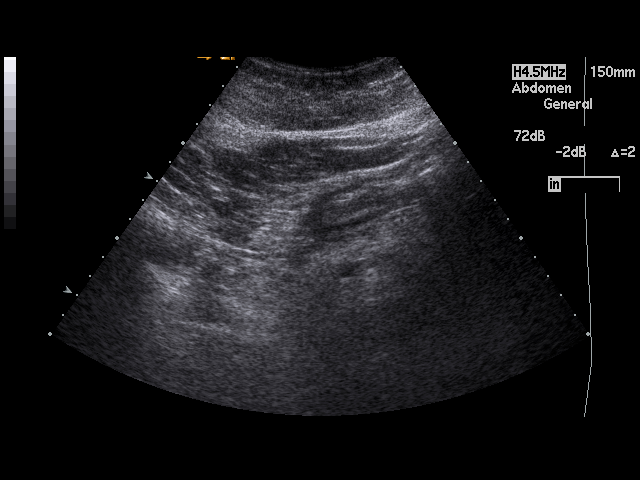
[im 6/68]
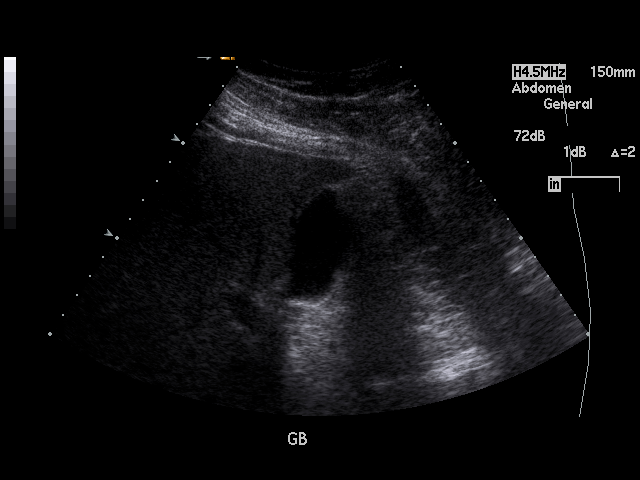
[im 9/68]
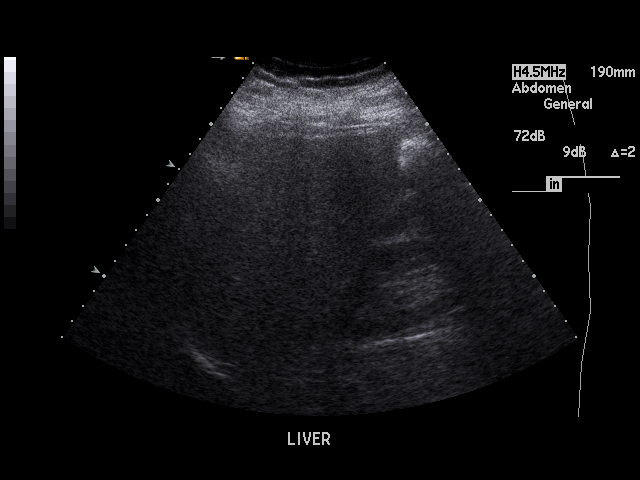
[im 14/68]
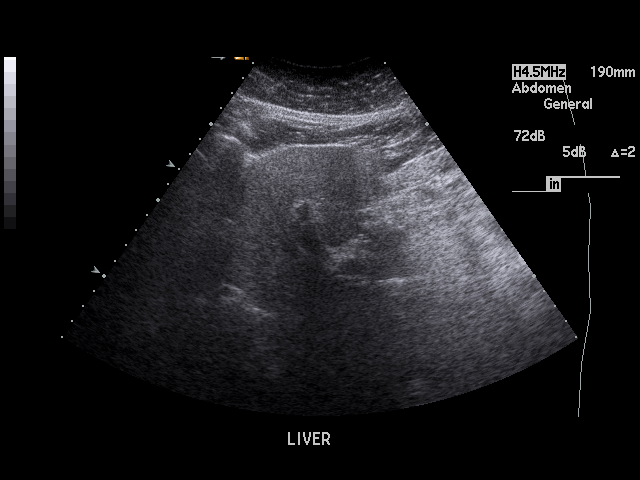
[im 17/68]
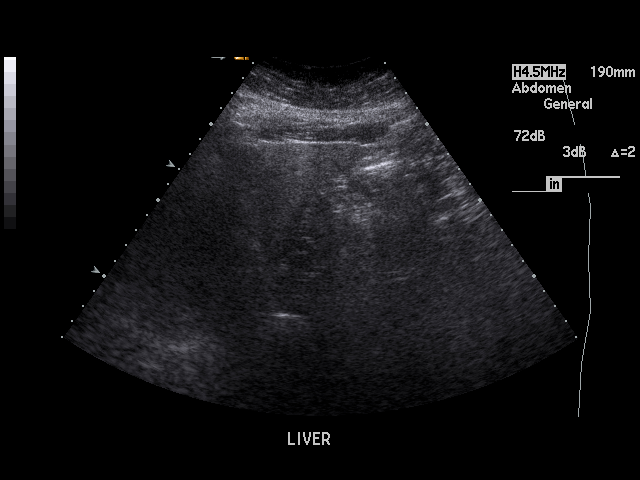
[im 23/68]
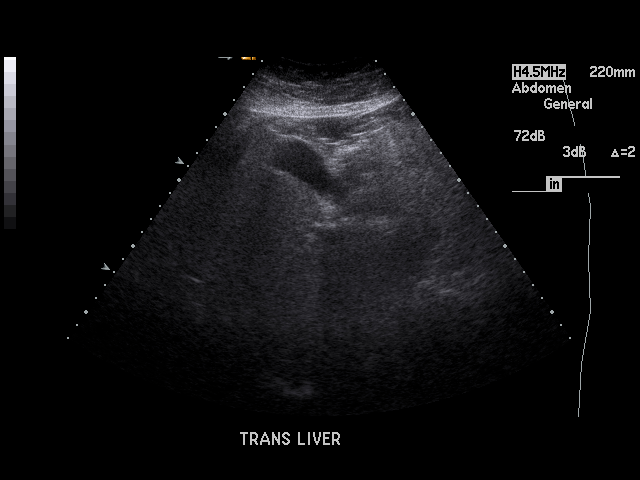
[im 26/68]
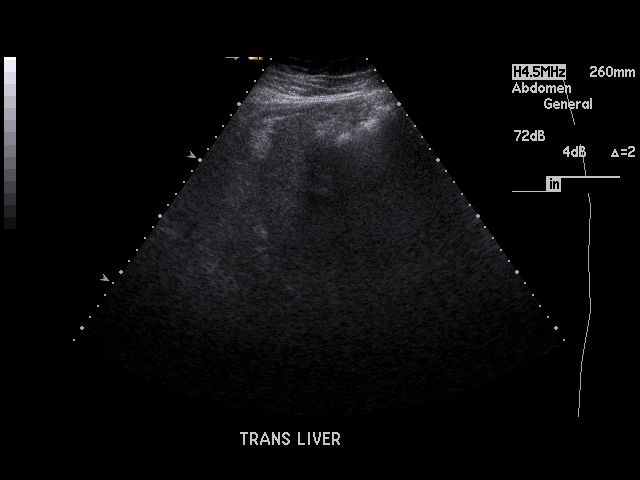
[im 31/68]
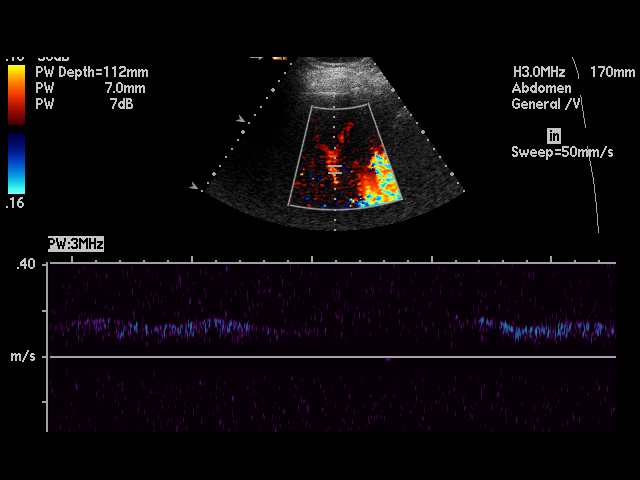
[im 34/68]
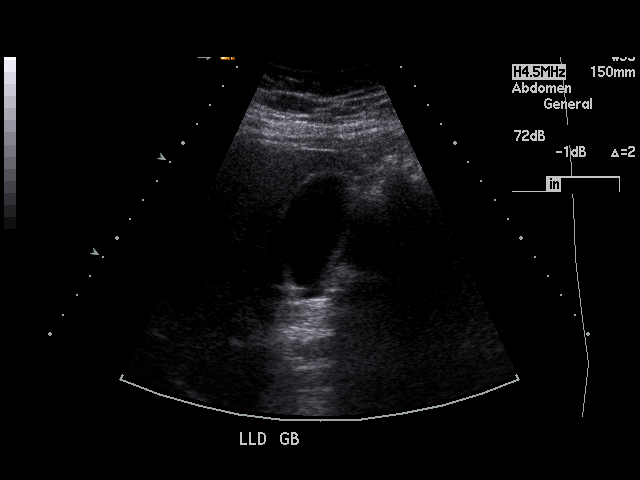
[im 37/68]
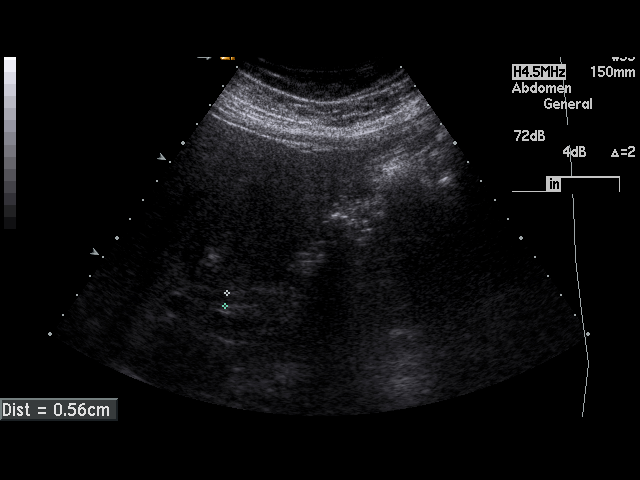
[im 42/68]
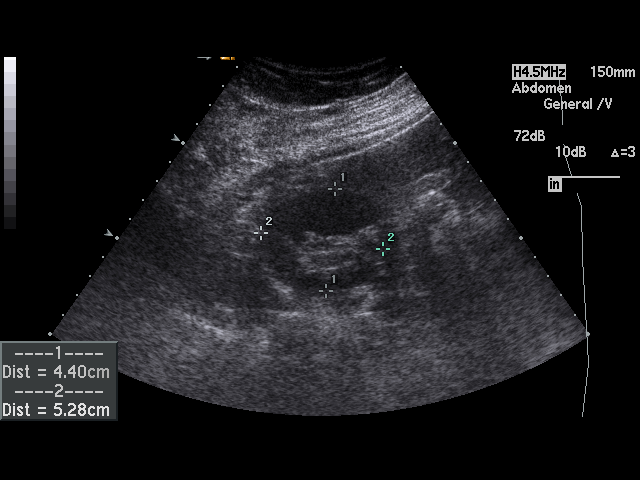
[im 45/68]
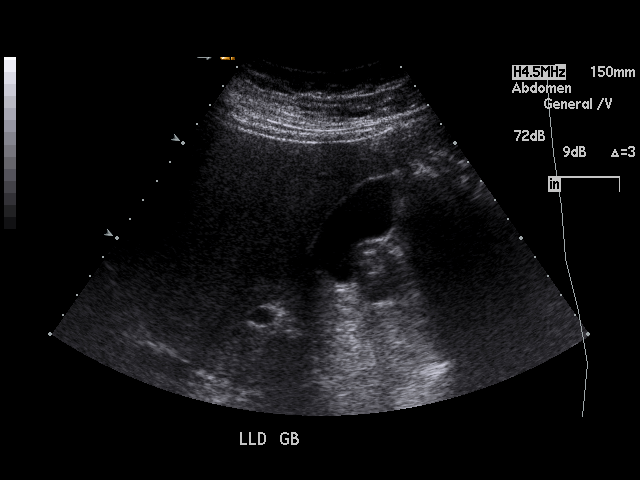
[im 51/68]
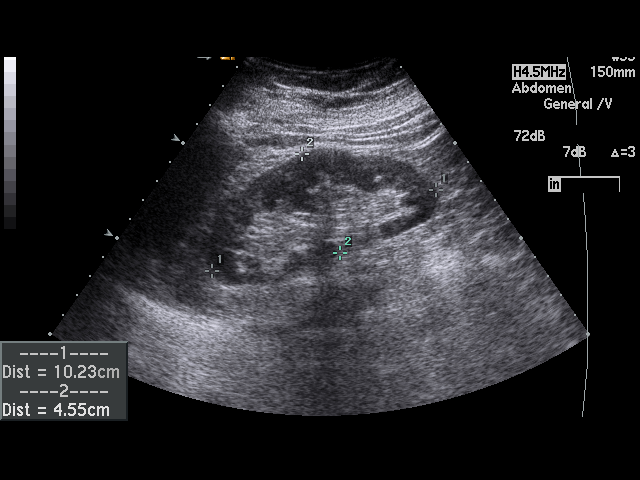
[im 54/68]
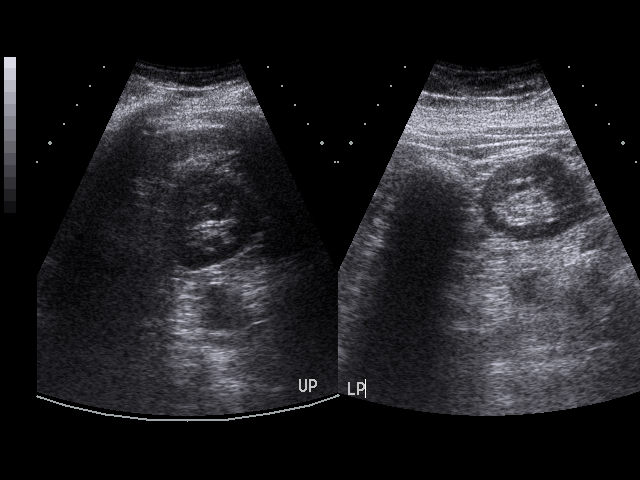
[im 59/68]
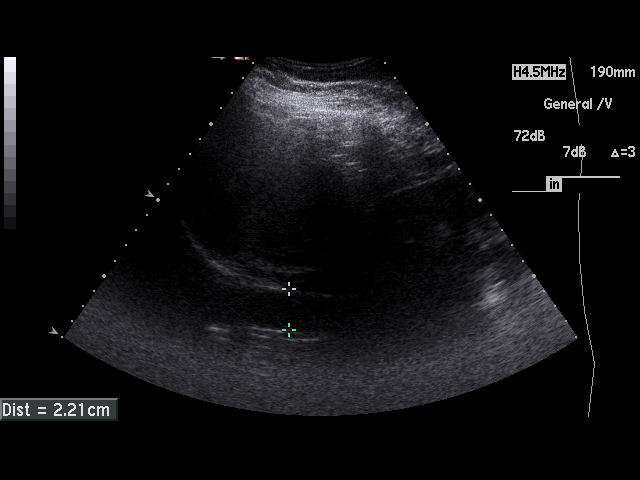
[im 62/68]
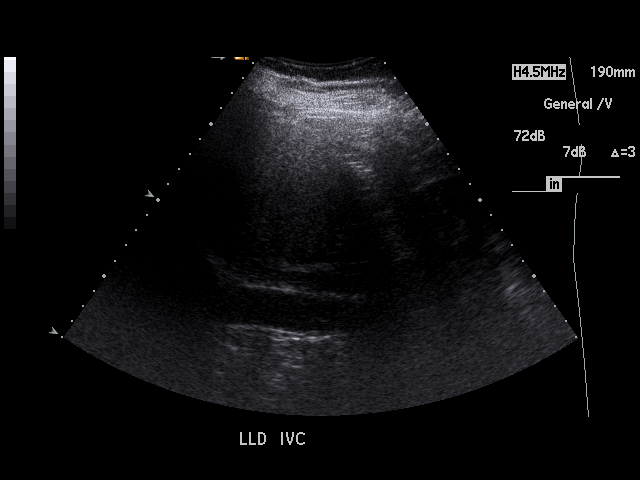
[im 68/68]
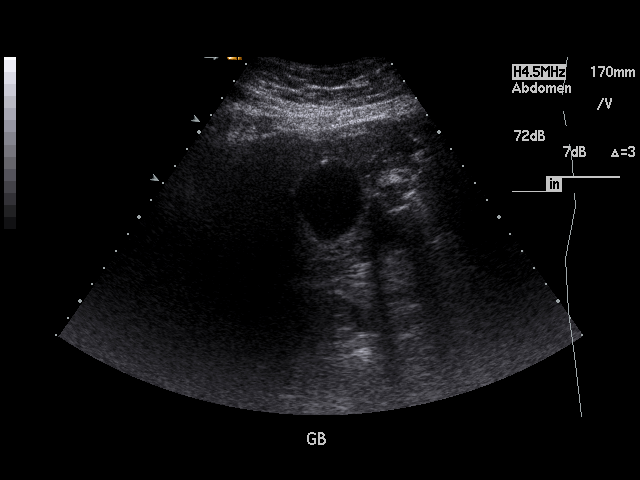

[17 of 25 positions shown; findings below may reference images not displayed]

PROCEDURE:     US  - US ABDOMEN GENERAL SURVEY  - May 30, 2010  [DATE]

RESULT:     The liver appears mildly hyperechogenic, suspicious for fatty
infiltration. No focal hepatic mass lesions are seen. The pancreas is not
visualized adequately for evaluation on this exam. Spleen size is normal.
The visualized portions of the abdominal aorta and inferior vena cava show
no significant abnormalities. No gallstones are seen. There is no thickening
of the gallbladder wall. The common bile duct measures 5.6 mm in diameter
which is within normal limits. The kidneys show no hydronephrosis. There is
no ascites.
IMPRESSION: 1. Probable fatty infiltration of the liver.
2. The pancreas is not visualized adequately for evaluation on this exam.
3. No gallstones are seen.
4. There is no ascites.

## 2010-12-14 ENCOUNTER — Ambulatory Visit: Payer: Self-pay | Admitting: Unknown Physician Specialty

## 2011-08-01 ENCOUNTER — Ambulatory Visit: Payer: Self-pay | Admitting: Internal Medicine

## 2011-12-06 IMAGING — MG MAM DGTL DIAGNOSTIC MAMMO W/CAD
1 series · 8 of 8 positions shown · non-contrast
Comparison: none

REASON FOR EXAM: HX BRST CA IMPLANTS YEARLY
COMMENTS:

[Series 3700: R CC · right · 8 of 8 slices shown]
[im 1/8]
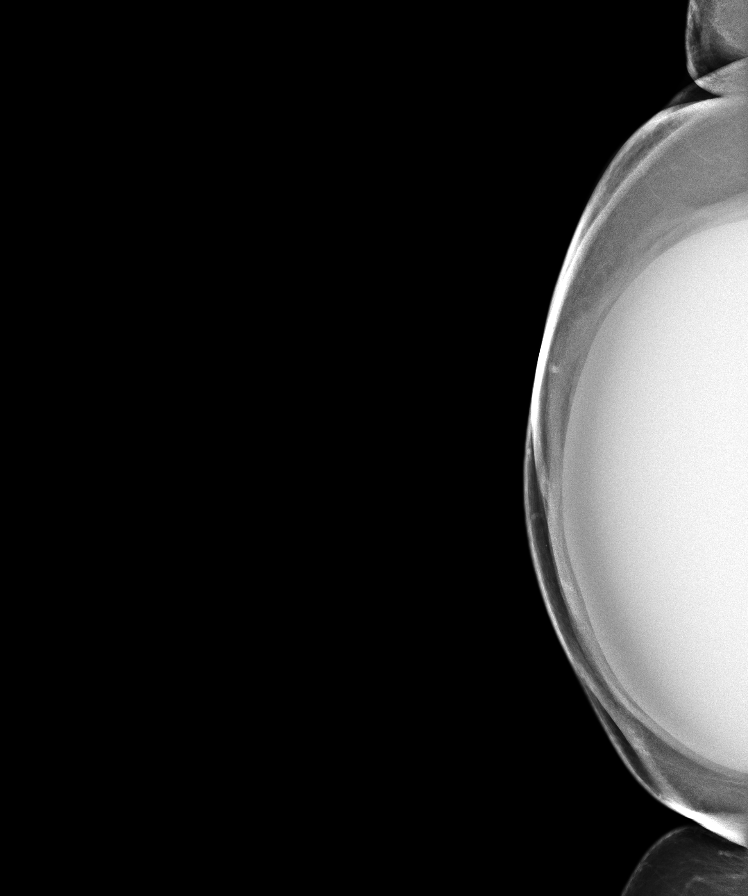
[im 2/8]
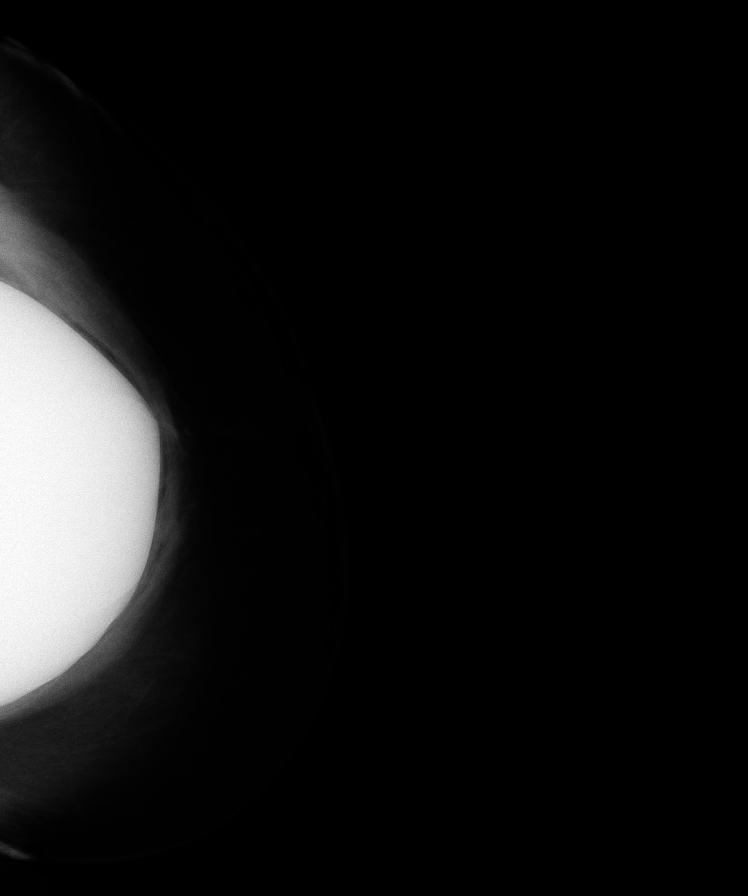
[im 3/8]
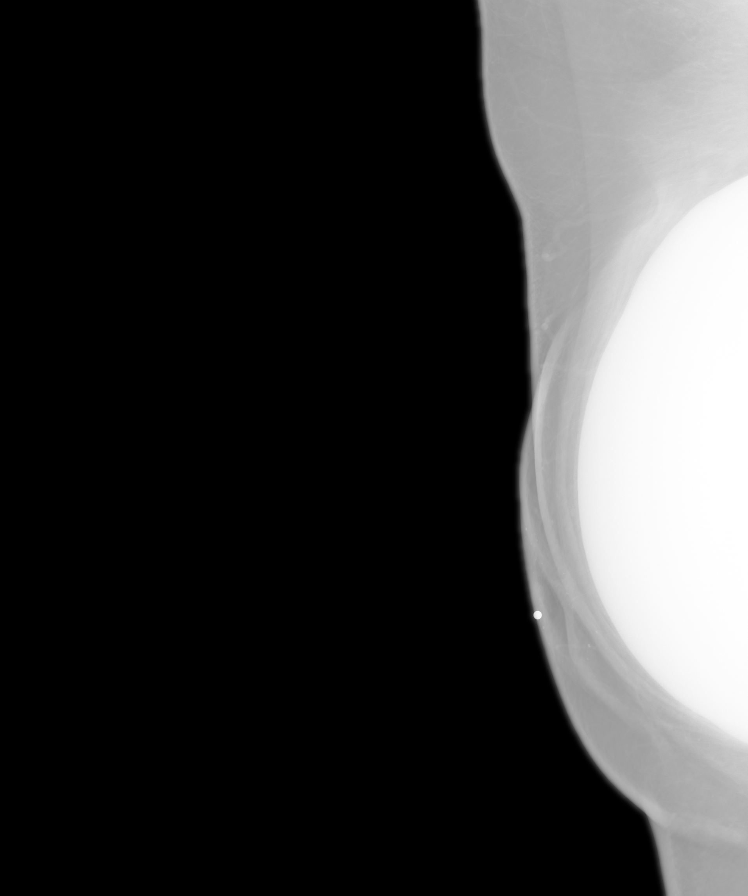
[im 4/8]
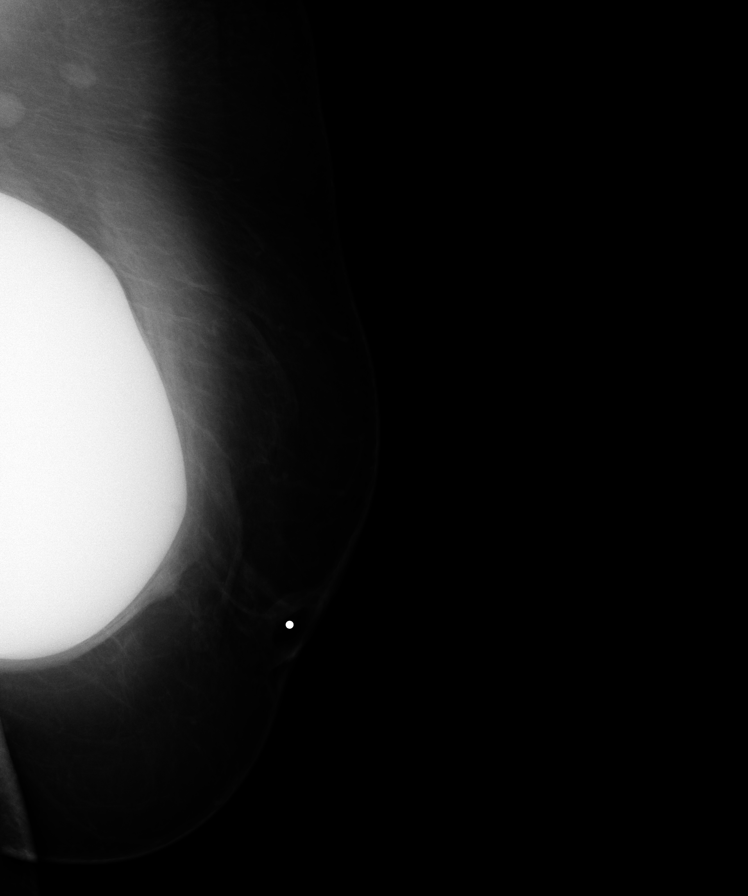
[im 5/8]
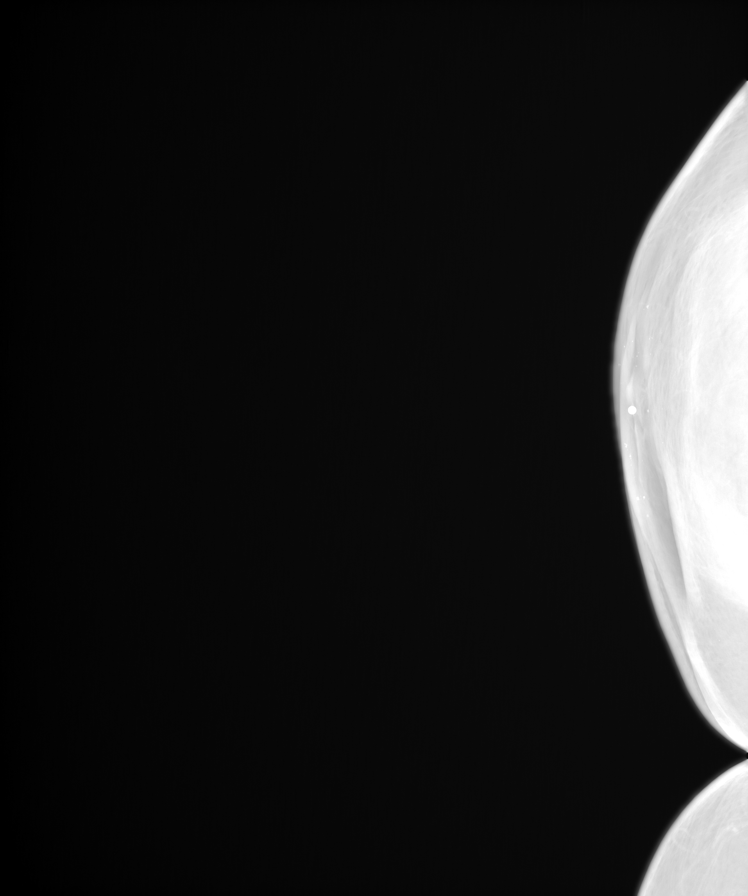
[im 6/8]
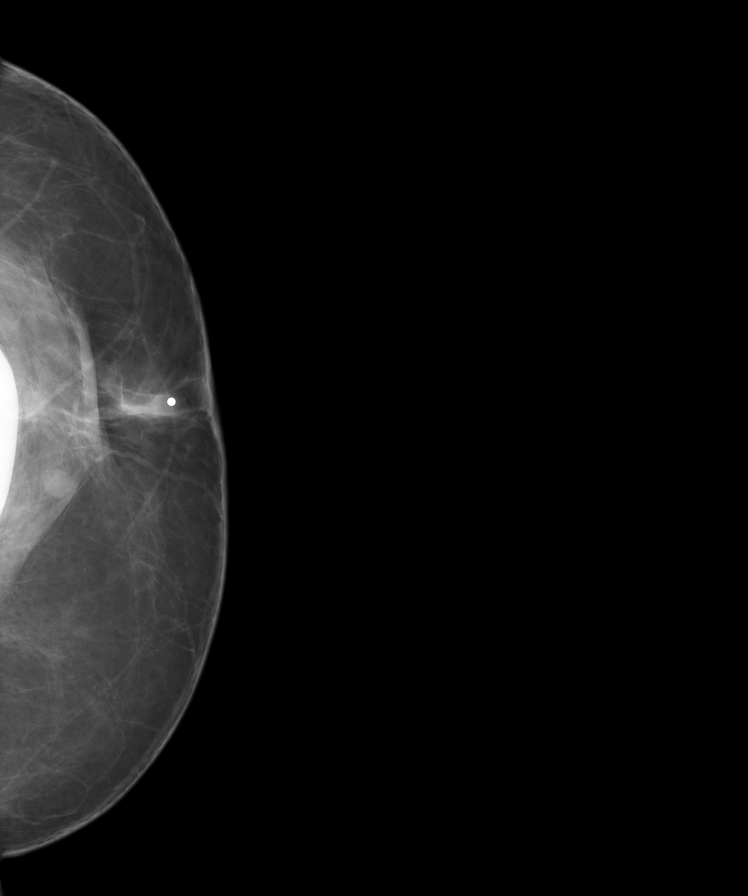
[im 7/8]
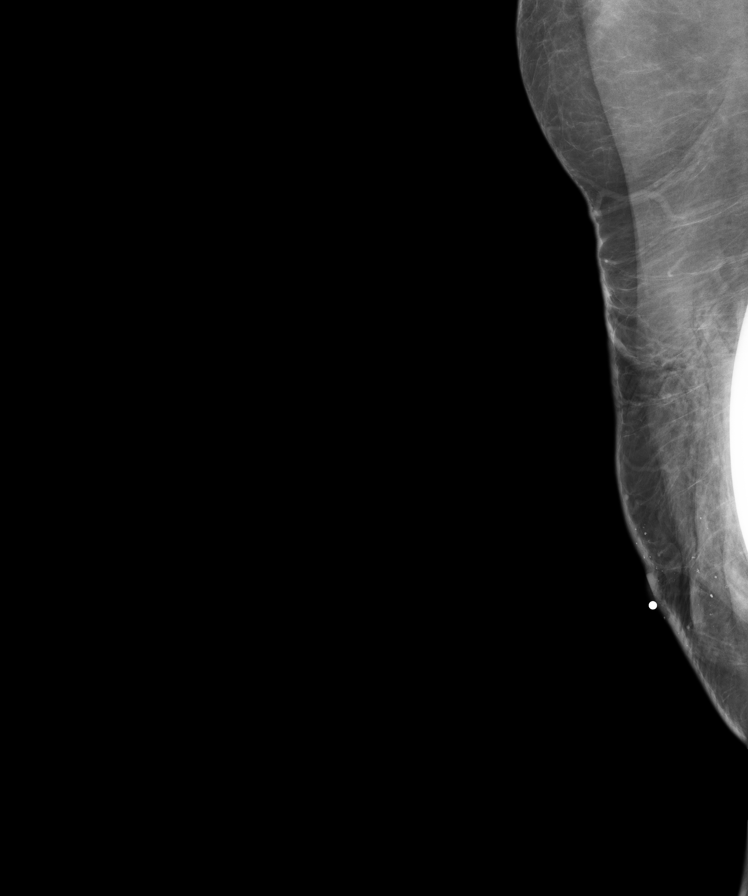
[im 8/8]
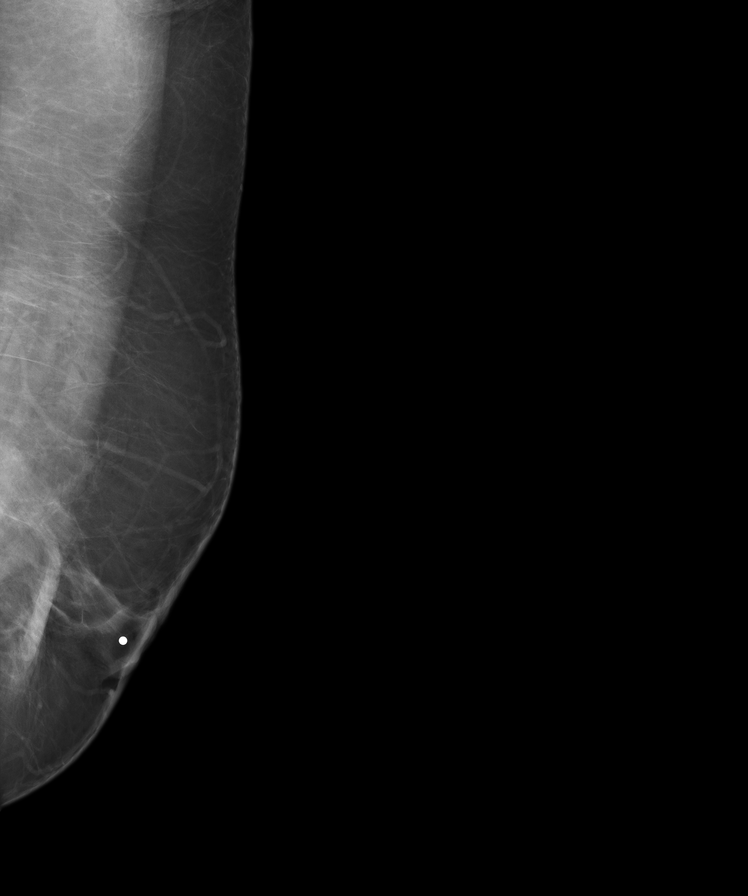

[8 of 8 positions shown; findings below may reference images not displayed]

PROCEDURE:     MAM - MAM DGTL DIAGNOSTIC MAMMO W/CAD  - August 01, 2011 [DATE]

RESULT:     There is no known family history of breast cancer. The patient
reports history of bilateral mastectomy in 3839 with subsequent
reconstruction with implants. The patient has right breast cancer history in
3839.  Comparison is made to the previous digital mammographic images dated
02-22-10, as well as 12-14-08 and 04-15-06. Breast implants appear intact.
There is a greater amount of native tissue on the left.  Even though the
history sheet shows bilateral mastectomy it appears the patient has not
undergone a left breast removal. Pushback or Ana Lilia view images show some
benign calcifications. No developing parenchymal density or dominant mass is
evident.
IMPRESSION: 1.Stable, benign appearing bilateral mammogram.

BI-RADS: Category 2 - Benign Findings

RECOMMENDATIONS:

1.     Please continue to encourage yearly mammographic follow-up.

A NEGATIVE MAMMOGRAM REPORT DOES NOT PRECLUDE BIOPSY OR OTHER EVALUATION OF
A CLINICALLY PALPABLE OR OTHERWISE SUSPICIOUS MASS OR LESION. BREAST CANCER
MAY NOT BE DETECTED BY MAMMOGRAPHY IN UP TO 10% OF CASES.

## 2012-09-21 ENCOUNTER — Ambulatory Visit: Payer: Self-pay | Admitting: Internal Medicine

## 2012-10-16 ENCOUNTER — Ambulatory Visit: Payer: Self-pay | Admitting: Internal Medicine

## 2013-03-30 ENCOUNTER — Ambulatory Visit: Payer: Self-pay | Admitting: Unknown Physician Specialty

## 2013-04-12 ENCOUNTER — Ambulatory Visit: Payer: Self-pay | Admitting: Unknown Physician Specialty

## 2013-04-14 LAB — PATHOLOGY REPORT

## 2013-08-04 IMAGING — US ABDOMEN ULTRASOUND LIMITED
1 series · 14 of 25 positions shown · non-contrast
Comparison: none

REASON FOR EXAM: Epigastric abd pain  vomiting   diarrhea
COMMENTS:

[Series 1: abdomen ultrasound limited · 0.30mm/px · 14 of 112 slices shown]
[im 1/112]
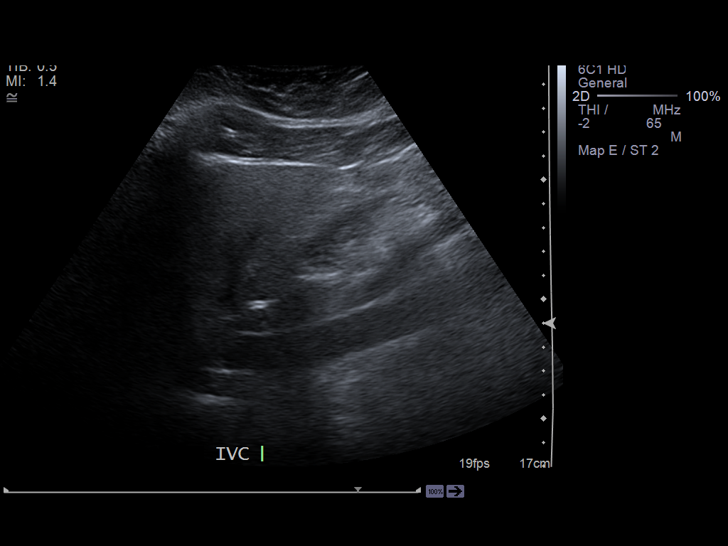
[im 10/112]
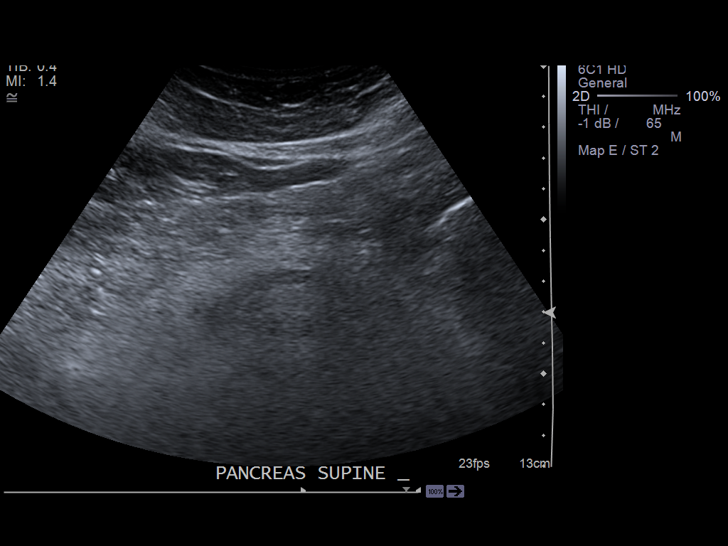
[im 19/112]
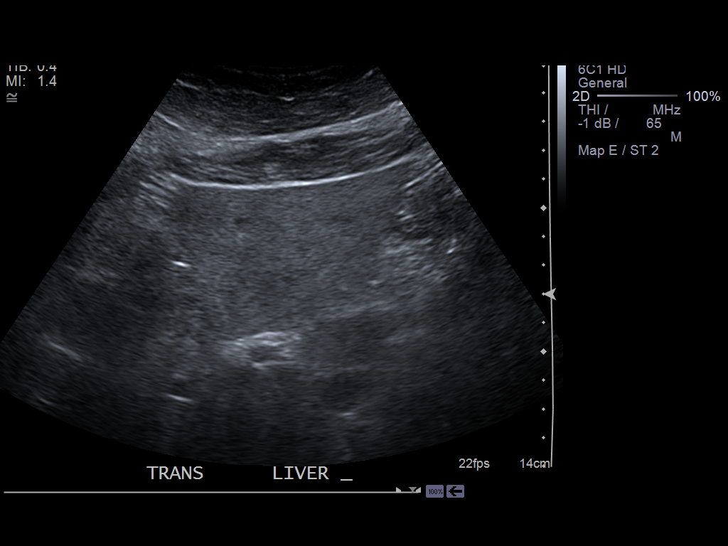
[im 28/112]
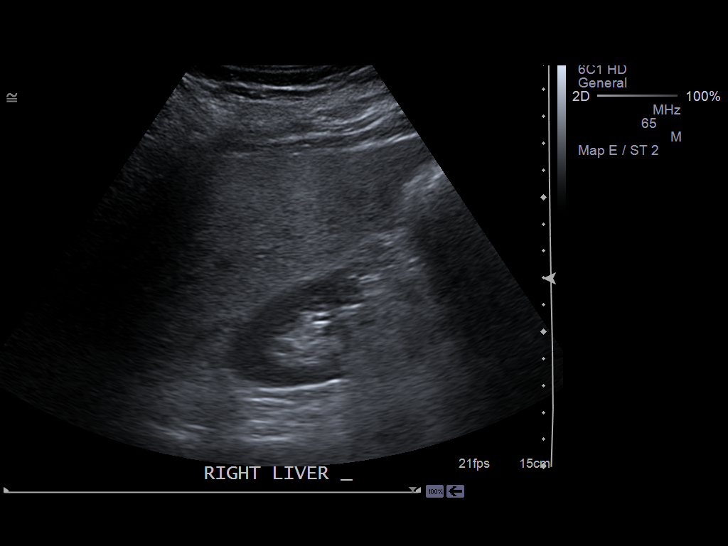
[im 38/112]
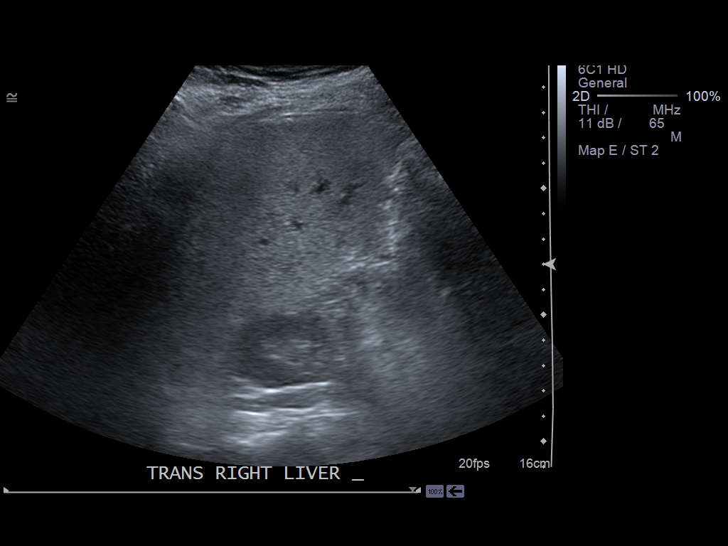
[im 42/112]
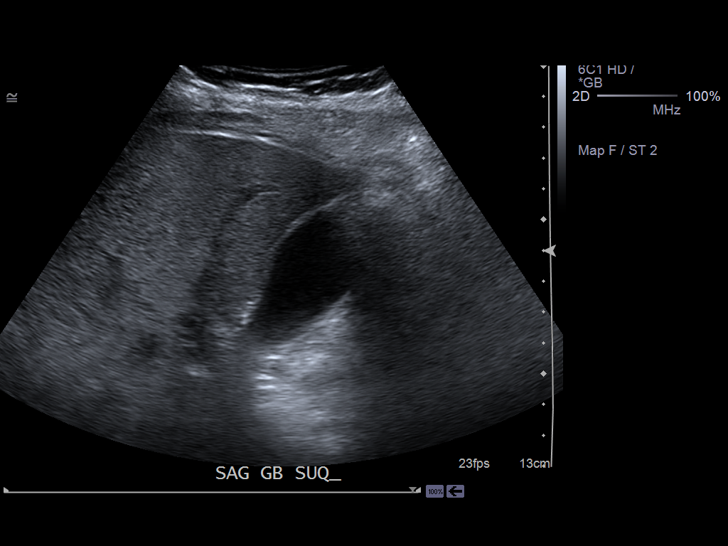
[im 51/112]
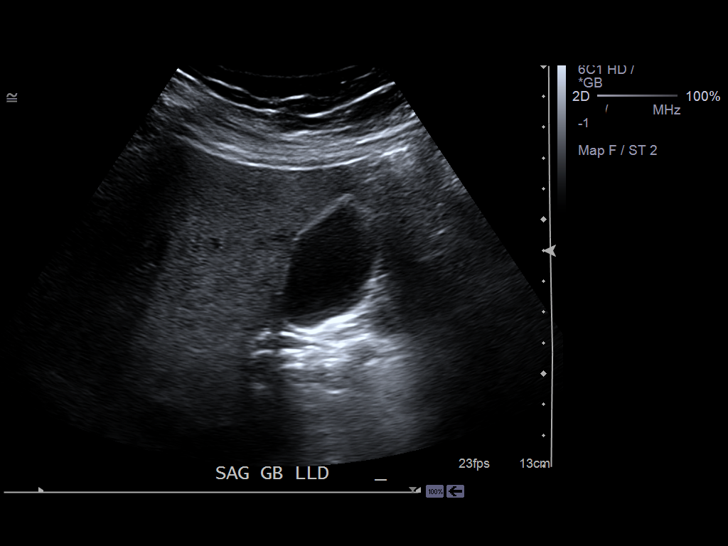
[im 61/112]
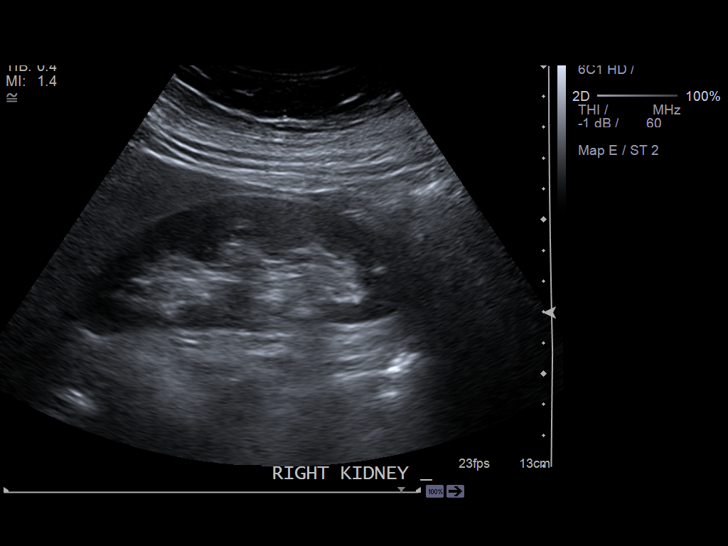
[im 70/112]
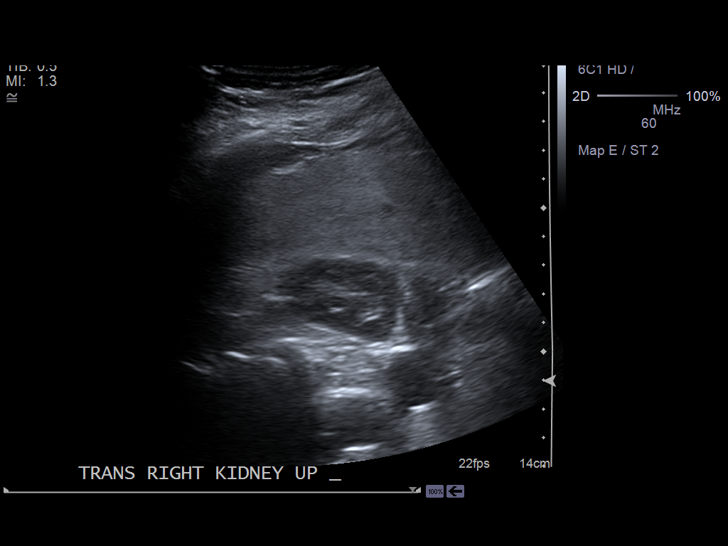
[im 75/112]
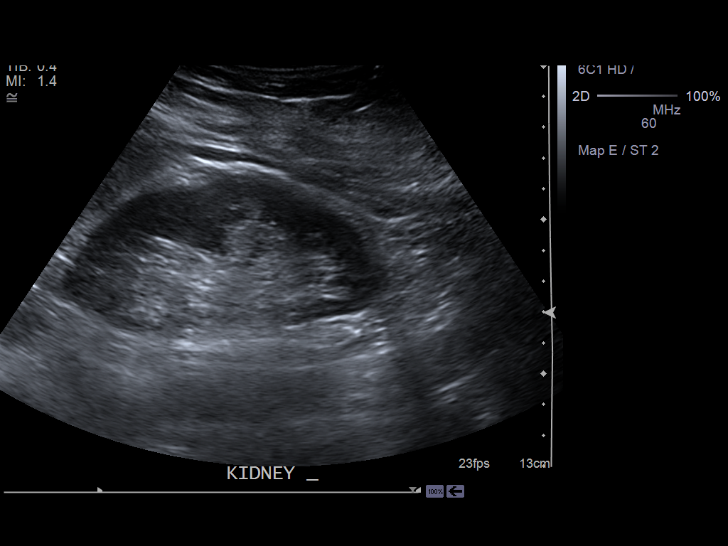
[im 84/112]
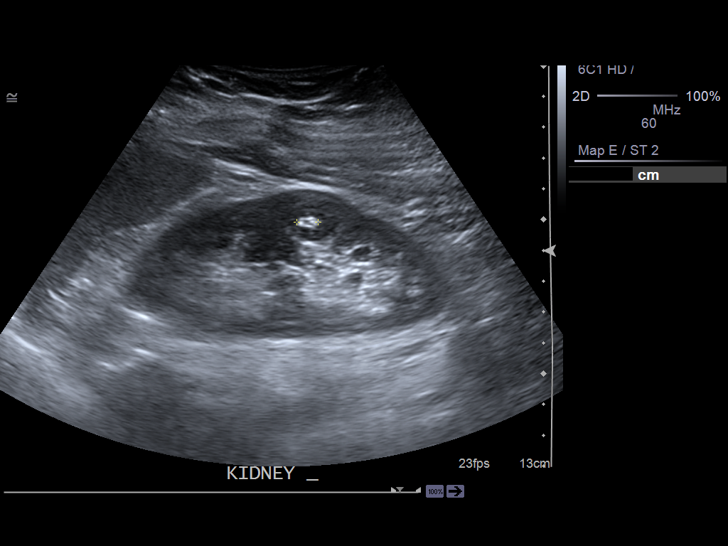
[im 93/112]
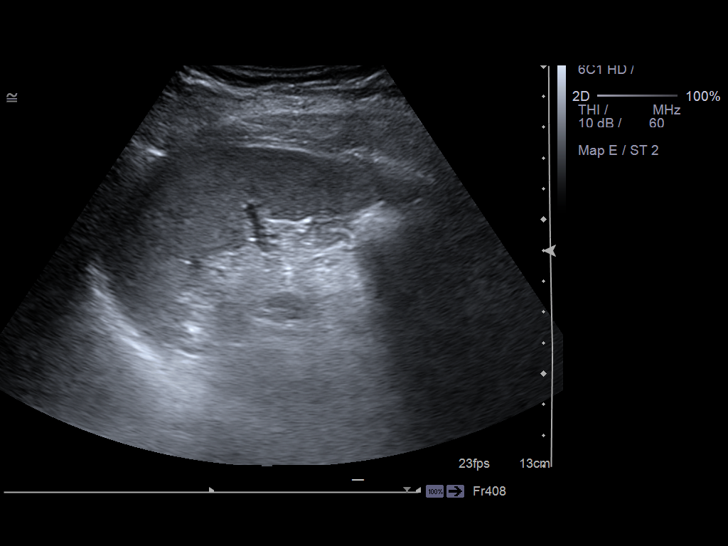
[im 102/112]
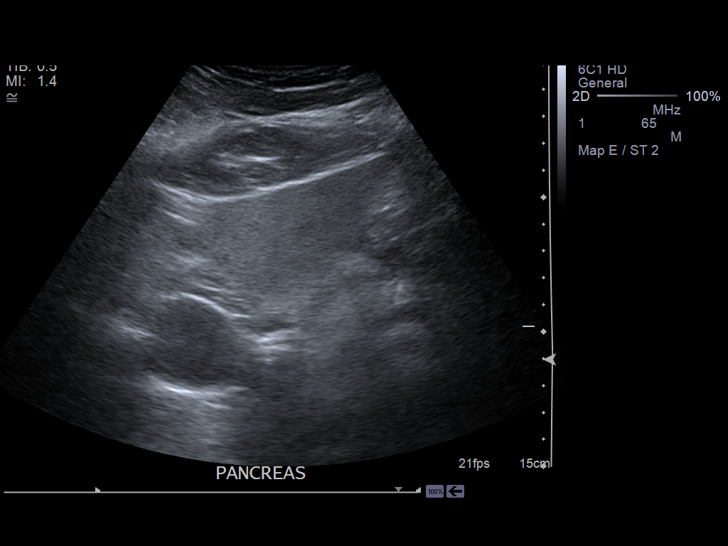
[im 112/112]
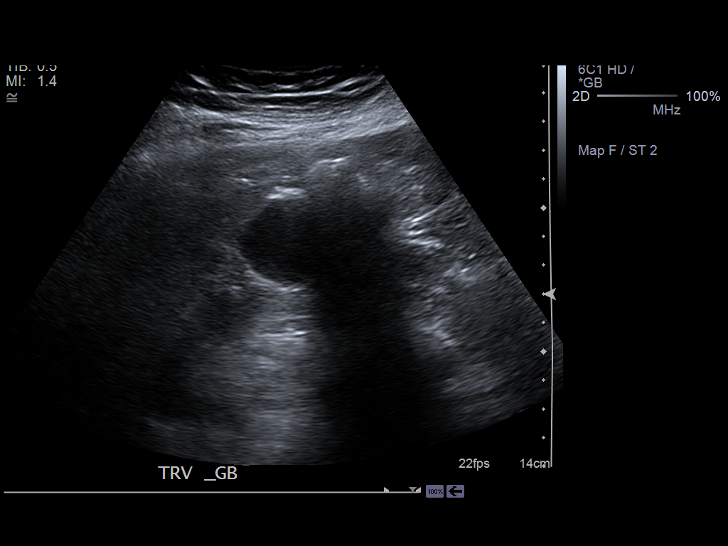

[14 of 25 positions shown; findings below may reference images not displayed]

PROCEDURE:     RUDI - RUDI ABDOMEN UPPER GENERAL  - March 30, 2013  [DATE]

RESULT:     The liver is normal in contour and exhibits mildly increased
echotexture suggesting fatty infiltration. Portal venous flow is normal in
direction toward the liver. Evaluation of the pancreas was limited due to
bowel gas. The gallbladder is adequately distended with no evidence of
stones, wall thickening, or pericholecystic fluid. There is no positive
sonographic Murphy's sign. The common bile duct measures 4.2 mill meters in
diameter.

The spleen, kidneys, inferior vena cava, and observed portions of the
abdominal aorta are normal in appearance. In the midpole of the left kidney
there is a 7 x 9 x 4 mm diameter hyperechoic nonshadowing focus. This may
reflect a fatty lesion such as an angiomyolipoma.
IMPRESSION: 1. Mildly increased echotexture of the liver suggests fatty infiltrative
change.
2. There is no evidence of gallstones or acute cholecystitis.
3. The spleen and kidneys exhibit no acute abnormalities.
4. Bowel gas limits evaluation of the pancreas abdominal aorta.

[REDACTED]

## 2013-09-30 DIAGNOSIS — E119 Type 2 diabetes mellitus without complications: Secondary | ICD-10-CM | POA: Insufficient documentation

## 2014-01-30 DIAGNOSIS — D72829 Elevated white blood cell count, unspecified: Secondary | ICD-10-CM

## 2014-01-30 HISTORY — DX: Elevated white blood cell count, unspecified: D72.829

## 2014-02-22 DIAGNOSIS — K589 Irritable bowel syndrome without diarrhea: Secondary | ICD-10-CM | POA: Insufficient documentation

## 2014-03-17 DIAGNOSIS — R011 Cardiac murmur, unspecified: Secondary | ICD-10-CM | POA: Insufficient documentation

## 2014-06-02 ENCOUNTER — Ambulatory Visit: Payer: Self-pay | Admitting: Podiatrist

## 2014-06-16 ENCOUNTER — Ambulatory Visit (INDEPENDENT_AMBULATORY_CARE_PROVIDER_SITE_OTHER): Payer: 59 | Admitting: Podiatrist

## 2014-06-16 ENCOUNTER — Encounter: Payer: Self-pay | Admitting: Podiatrist

## 2014-06-16 VITALS — BP 132/68 | HR 80 | Resp 12

## 2014-06-16 DIAGNOSIS — Q828 Other specified congenital malformations of skin: Secondary | ICD-10-CM

## 2014-06-16 NOTE — Patient Instructions (Signed)
Corns and Calluses Corns are small areas of thickened skin that usually occur on the top, sides, or tip of a toe. They contain a cone-shaped core with a point that can press on a nerve below. This causes pain. Calluses are areas of thickened skin that usually develop on hands, fingers, palms, soles of the feet, and heels. These are areas that experience frequent friction or pressure. CAUSES  Corns are usually the result of rubbing (friction) or pressure from shoes that are too tight or do not fit properly. Calluses are caused by repeated friction and pressure on the affected areas. SYMPTOMS  A hard growth on the skin.  Pain or tenderness under the skin.  Sometimes, redness and swelling.  Increased discomfort while wearing tight-fitting shoes. DIAGNOSIS  Your caregiver can usually tell what the problem is by doing a physical exam. TREATMENT  Removing the cause of the friction or pressure is usually the only treatment needed. However, sometimes medicines can be used to help soften the hardened, thickened areas. These medicines include salicylic acid plasters and 12% ammonium lactate lotion. These medicines should only be used under the direction of your caregiver. HOME CARE INSTRUCTIONS   Try to remove pressure from the affected area.  You may wear donut-shaped corn pads to protect your skin.  You may use a pumice stone or nonmetallic nail file to gently reduce the thickness of a corn.  Wear properly fitted footwear.  If you have calluses on the hands, wear gloves during activities that cause friction.  If you have diabetes, you should regularly examine your feet. Tell your caregiver if you notice any problems with your feet. SEEK IMMEDIATE MEDICAL CARE IF:   You have increased pain, swelling, redness, or warmth in the affected area.  Your corn or callus starts to drain fluid or bleeds.  You are not getting better, even with treatment. Document Released: 08/24/2004 Document  Revised: 02/10/2012 Document Reviewed: 07/16/2011 [employer] Patient Information 2015 [employer]. This information is not intended to replace advice given to you by your health care provider. Make sure you discuss any questions you have with your health care provider.  

## 2014-06-16 NOTE — Progress Notes (Signed)
   Subjective:    Patient ID: Melissa Curtis, female    DOB: 1941/10/13, 73 y.o.   MRN: [id number]  HPI  PT STATED LT FOOT BOTTOM GREAT TOE HAVE CORN AND ITS BEEN HURTING FOR 2 MONTHS.  THE TOE IS BEEN THE SAME BUT THE SKIN IS GETTING THICKER. THE TOE GET AGGRAVATED BY PRESSURE AND TRIED NO TREATMENT.    Review of Systems  Genitourinary: Positive for frequency.  Musculoskeletal: Positive for back pain.  All other systems reviewed and are negative.      Objective:   Physical Exam  Patient is awake, alert, and oriented x 3.  In no acute distress.  Vascular status is intact with palpable pedal pulses at 2/4 DP and PT bilateral and capillary refill time within normal limits. Neurological sensation is also intact bilaterally via Semmes Weinstein monofilament at 5/5 sites. Light touch, vibratory sensation, Achilles tendon reflex is intact. Dermatological exam reveals skin color, turger and texture as normal. No open lesions present.  Musculature intact with dorsiflexion, plantarflexion, inversion, eversion. Right second toe is dislocated at the distal interphalangeal joint hammertoe contraction is also present bilaterally Pinpoint callus is present on left hallux plantarly at the IP joint. It appears it may be from a foreign body of some type of the deformity is identified with debridement. No other abnormalities present in her skin her toenails however do appear to have mycotic nature to them.     Assessment & Plan:  Porokeratotic lesion x1 hallux interphalangeus  Plan: Excised the lesion with a 15 blade. Applied a pad with silicone and instructed her on its wear. She'll be seen back as needed for followup

## 2014-11-09 DIAGNOSIS — M549 Dorsalgia, unspecified: Secondary | ICD-10-CM | POA: Insufficient documentation

## 2015-01-15 DIAGNOSIS — M8580 Other specified disorders of bone density and structure, unspecified site: Secondary | ICD-10-CM | POA: Insufficient documentation

## 2015-08-29 DIAGNOSIS — I10 Essential (primary) hypertension: Secondary | ICD-10-CM | POA: Insufficient documentation

## 2015-09-22 ENCOUNTER — Emergency Department
Admission: EM | Admit: 2015-09-22 | Discharge: 2015-09-22 | Disposition: A | Discharge status: Home / Self Care | Payer: Medicare Other | Attending: Emergency Medicine | Admitting: Emergency Medicine

## 2015-09-22 DIAGNOSIS — D72829 Elevated white blood cell count, unspecified: Secondary | ICD-10-CM | POA: Diagnosis not present

## 2015-09-22 DIAGNOSIS — R799 Abnormal finding of blood chemistry, unspecified: Secondary | ICD-10-CM | POA: Diagnosis present

## 2015-09-22 LAB — CBC WITH DIFFERENTIAL/PLATELET
Band Neutrophils: 0 %
Basophils Absolute: 0 10*3/uL (ref 0–0.1)
Basophils Relative: 0 %
Blasts: 0 %
Eosinophils Absolute: 0 10*3/uL (ref 0–0.7)
Eosinophils Relative: 0 %
HCT: 44.1 % (ref 35.0–47.0)
Hemoglobin: 14.5 g/dL (ref 12.0–16.0)
Lymphocytes Relative: 86 %
Lymphs Abs: 37.1 10*3/uL — ABNORMAL HIGH (ref 1.0–3.6)
MCH: 29.9 pg (ref 26.0–34.0)
MCHC: 32.8 g/dL (ref 32.0–36.0)
MCV: 91 fL (ref 80.0–100.0)
Metamyelocytes Relative: 0 %
Monocytes Absolute: 0.4 10*3/uL (ref 0.2–0.9)
Monocytes Relative: 1 %
Myelocytes: 0 %
Neutro Abs: 5.6 10*3/uL (ref 1.4–6.5)
Neutrophils Relative %: 13 %
Other: 0 %
Platelets: 195 10*3/uL (ref 150–440)
Promyelocytes Absolute: 0 %
RBC: 4.85 MIL/uL (ref 3.80–5.20)
RDW: 13.7 % (ref 11.5–14.5)
WBC: 43.1 10*3/uL — ABNORMAL HIGH (ref 3.6–11.0)
nRBC: 1 /100 WBC — ABNORMAL HIGH

## 2015-09-22 LAB — ALKALINE PHOSPHATASE: Alkaline Phosphatase: 72 U/L (ref 38–126)

## 2015-09-22 LAB — PATHOLOGIST SMEAR REVIEW

## 2015-09-22 LAB — BASIC METABOLIC PANEL
Anion gap: 8 (ref 5–15)
BUN: 13 mg/dL (ref 6–20)
CO2: 28 mmol/L (ref 22–32)
Calcium: 9.2 mg/dL (ref 8.9–10.3)
Chloride: 102 mmol/L (ref 101–111)
Creatinine, Ser: 0.81 mg/dL (ref 0.44–1.00)
GFR calc Af Amer: >60 mL/min (ref >60)
GFR calc non Af Amer: >60 mL/min (ref >60)
Glucose, Bld: 122 mg/dL — ABNORMAL HIGH (ref 65–99)
Potassium: 4.4 mmol/L (ref 3.5–5.1)
Sodium: 138 mmol/L (ref 135–145)

## 2015-09-22 NOTE — ED Notes (Signed)
Pt had lab work rechecked and was told her wbc was 42 today and told to go to the nearest ER, pt states that she had cold s/s a few weeks ago. Pt denies any pain or s/s of sickness at this time

## 2015-09-22 NOTE — ED Provider Notes (Signed)
[facility] Emergency Department Provider Note   ____________________________________________  Time seen: 1600  I have reviewed the triage vital signs and the nursing notes.   HISTORY  Chief Complaint Abnormal Lab   History limited by: Not Limited   HPI Melissa Curtis is a 74 y.o. female who presents to the emergency department today at the request of her primary care doctor because of concern for elevated white blood cell count found on blood work today. The patient states that she has been having her blood work monitored for the past roughly 4 weeks after she had a pneumonia/upper respiratory infection. She states that her white blood cell count had been elevated that time so they were continuing to moderate. She states that it had been running in the mid teens. Her last one she stated was 16. Today she got a call saying that her white blood cell was very elevated to the 40s. She denies any symptoms. She denies any weight loss or night sweats. Denies any shortness of breath.     No past medical history on file.  There are no active problems to display for this patient.   No past surgical history on file.  No current outpatient prescriptions on file.  Allergies Iodine and Sulfa antibiotics  No family history on file.  Social History Social History  Substance Use Topics  . Smoking status: Never Smoker   . Smokeless tobacco: Not on file  . Alcohol Use: No    Review of Systems  Constitutional: Negative for fever. Cardiovascular: Negative for chest pain. Respiratory: Negative for shortness of breath. Gastrointestinal: Negative for abdominal pain, vomiting and diarrhea. Genitourinary: Negative for dysuria. Musculoskeletal: Negative for back pain. Skin: Negative for rash. Neurological: Negative for headaches, focal weakness or numbness.  10-point ROS otherwise negative.  ____________________________________________   PHYSICAL  EXAM:  VITAL SIGNS: ED Triage Vitals  Enc Vitals Group     BP 09/22/15 1307 149/85 mmHg     Pulse Rate 09/22/15 1307 88     Resp 09/22/15 1307 18     Temp 09/22/15 1307 97.5 F (36.4 C)     Temp Source 09/22/15 1307 Oral     SpO2 09/22/15 1307 96 %     Weight 09/22/15 1307 148 lb (67.132 kg)     Height 09/22/15 1307 5\' 3"  (1.6 m)   Constitutional: Alert and oriented. Well appearing and in no distress. Eyes: Conjunctivae are normal. PERRL. Normal extraocular movements. ENT   Head: Normocephalic and atraumatic.   Nose: No congestion/rhinnorhea.   Mouth/Throat: Mucous membranes are moist.   Neck: No stridor. Hematological/Lymphatic/Immunilogical: No cervical lymphadenopathy. Cardiovascular: Normal rate, regular rhythm.  No murmurs, rubs, or gallops. Respiratory: Normal respiratory effort without tachypnea nor retractions. Breath sounds are clear and equal bilaterally. No wheezes/rales/rhonchi. Gastrointestinal: Soft and nontender. No distention.  Genitourinary: Deferred Musculoskeletal: Normal range of motion in all extremities. No joint effusions.  No lower extremity tenderness nor edema. Neurologic:  Normal speech and language. No gross focal neurologic deficits are appreciated. Speech is normal.  Skin:  Skin is warm, dry and intact. No rash noted. Psychiatric: Mood and affect are normal. Speech and behavior are normal. Patient exhibits appropriate insight and judgment.  ____________________________________________    LABS (pertinent positives/negatives)  Labs Reviewed  CBC WITH DIFFERENTIAL/PLATELET - Abnormal; Notable for the following:    WBC 43.1 (*)    nRBC 1 (*)    Lymphs Abs 37.1 (*)    All other components within normal  limits  BASIC METABOLIC PANEL - Abnormal; Notable for the following:    Glucose, Bld 122 (*)    All other components within normal limits  ALKALINE PHOSPHATASE  PATHOLOGIST SMEAR REVIEW      ____________________________________________   EKG  None  ____________________________________________    RADIOLOGY  None   ____________________________________________   PROCEDURES  Procedure(s) performed: None  Critical Care performed: No  ____________________________________________   INITIAL IMPRESSION / ASSESSMENT AND PLAN / ED COURSE  Pertinent labs & imaging results that were available during my care of the patient were reviewed by me and considered in my medical decision making (see chart for details).  Patient with a severely elevated white blood cell count which is predominantly lymphocytes. There were some smudge cells seen. 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 Currently patient is completely asymptomatic. The other blood lines or within normal limits. I discussed with hematology oncology on call and they recommended following up as outpatient. I discussed this with the patient. Will have patient call hematology oncology to set up an appointment.  ____________________________________________   FINAL CLINICAL IMPRESSION(S) / ED DIAGNOSES  Final diagnoses:  Leukocytosis     Nance Pear, MD 09/22/15 [phone]

## 2015-09-22 NOTE — Discharge Instructions (Signed)
Please seek medical attention for any high fevers, chest pain, shortness of breath, change in behavior, persistent vomiting, bloody stool or any other new or concerning symptoms.   Leukocytosis Leukocytosis means you have more white blood cells than normal. White blood cells are made in your bone marrow. The main job of white blood cells is to fight infection. Having too many white blood cells is a common condition. It can develop as a result of many types of medical problems. CAUSES  In some cases, your bone marrow may be normal, but it is still making too many white blood cells. This could be the result of:  Infection.  Injury.  Physical stress.  Emotional stress.  Surgery.  Allergic reactions.  Tumors that do not start in the blood or bone marrow.  An inherited disease.  Certain medicines.  Pregnancy and labor. In other cases, you may have a bone marrow disorder that is causing your body to make too many white blood cells. Bone marrow disorders include:  Leukemia. This is a type of blood cancer.  Myeloproliferative disorders. These disorders cause blood cells to grow abnormally. SYMPTOMS  Some people have no symptoms. Others have symptoms due to the medical problem that is causing their leukocytosis. These symptoms may include:  Bleeding.  Bruising.  Fever.  Night sweats.  Repeated infections.  Weakness.  Weight loss. DIAGNOSIS  Leukocytosis is often found during blood tests that are done as part of a normal physical exam. Your caregiver will probably order other tests to help determine why you have too many white blood cells. These tests may include:  A complete blood count (CBC). This test measures all the types of blood cells in your body.  Chest X-rays, urine tests (urinalysis), or other tests to look for signs of infection.  Bone marrow aspiration. For this test, a needle is put into your bone. Cells from the bone marrow are removed through the needle.  The cells are then examined under a microscope. TREATMENT  Treatment is usually not needed for leukocytosis. However, if a disorder is causing your leukocytosis, it will need to be treated. Treatment may include:  Antibiotic medicines if you have a bacterial infection.  Bone marrow transplant. Your diseased bone marrow is replaced with healthy cells that will grow new bone marrow.  Chemotherapy. This is the use of drugs to kill cancer cells. HOME CARE INSTRUCTIONS  Only take over-the-counter or prescription medicines as directed by your caregiver.  Maintain a healthy weight. Ask your caregiver what weight is best for you.  Eat foods that are low in saturated fats and high in fiber. Eat plenty of fruits and vegetables.  Drink enough fluids to keep your urine clear or pale yellow.  Get 30 minutes of exercise at least 5 times a week. Check with your caregiver before starting a new exercise routine.  Limit caffeine and alcohol.  Do not smoke.  Keep all follow-up appointments as directed by your caregiver. SEEK MEDICAL CARE IF:  You feel weak or more tired than usual.  You develop chills, a cough, or nasal congestion.  You lose weight without trying.  You have night sweats.  You bruise easily. SEEK IMMEDIATE MEDICAL CARE IF:  You bleed more than normal.  You have chest pain.  You have trouble breathing.  You have a fever.  You have uncontrolled nausea or vomiting.  You feel dizzy or lightheaded. MAKE SURE YOU:  Understand these instructions.  Will watch your condition.  Will get help  right away if you are not doing well or get worse.   This information is not intended to replace advice given to you by your health care provider. Make sure you discuss any questions you have with your health care provider.   Document Released: 11/07/2011 Document Revised: 02/10/2012 Document Reviewed: 05/22/2015 Elsevier Interactive Patient Education [employer].

## 2015-10-03 ENCOUNTER — Inpatient Hospital Stay: Payer: Medicare Other

## 2015-10-03 ENCOUNTER — Encounter: Payer: Self-pay | Admitting: Internal Medicine

## 2015-10-03 ENCOUNTER — Ambulatory Visit: Payer: Medicare Other | Admitting: Internal Medicine

## 2015-10-03 ENCOUNTER — Inpatient Hospital Stay: Payer: Medicare Other | Attending: Internal Medicine | Admitting: Internal Medicine

## 2015-10-03 ENCOUNTER — Other Ambulatory Visit: Payer: Self-pay | Admitting: *Deleted

## 2015-10-03 VITALS — BP 122/69 | HR 72 | Temp 97.8°F | Resp 18 | Ht 63.0 in | Wt 154.0 lb

## 2015-10-03 DIAGNOSIS — D72829 Elevated white blood cell count, unspecified: Secondary | ICD-10-CM | POA: Diagnosis present

## 2015-10-03 DIAGNOSIS — C9110 Chronic lymphocytic leukemia of B-cell type not having achieved remission: Secondary | ICD-10-CM | POA: Insufficient documentation

## 2015-10-03 DIAGNOSIS — K219 Gastro-esophageal reflux disease without esophagitis: Secondary | ICD-10-CM | POA: Insufficient documentation

## 2015-10-03 DIAGNOSIS — R599 Enlarged lymph nodes, unspecified: Secondary | ICD-10-CM | POA: Diagnosis not present

## 2015-10-03 DIAGNOSIS — M8580 Other specified disorders of bone density and structure, unspecified site: Secondary | ICD-10-CM | POA: Diagnosis not present

## 2015-10-03 DIAGNOSIS — Z79899 Other long term (current) drug therapy: Secondary | ICD-10-CM | POA: Diagnosis not present

## 2015-10-03 DIAGNOSIS — E119 Type 2 diabetes mellitus without complications: Secondary | ICD-10-CM | POA: Diagnosis not present

## 2015-10-03 DIAGNOSIS — Z853 Personal history of malignant neoplasm of breast: Secondary | ICD-10-CM | POA: Insufficient documentation

## 2015-10-03 DIAGNOSIS — I10 Essential (primary) hypertension: Secondary | ICD-10-CM | POA: Insufficient documentation

## 2015-10-03 DIAGNOSIS — Z9013 Acquired absence of bilateral breasts and nipples: Secondary | ICD-10-CM | POA: Diagnosis not present

## 2015-10-03 DIAGNOSIS — I2510 Atherosclerotic heart disease of native coronary artery without angina pectoris: Secondary | ICD-10-CM | POA: Diagnosis not present

## 2015-10-03 DIAGNOSIS — Z800 Family history of malignant neoplasm of digestive organs: Secondary | ICD-10-CM | POA: Diagnosis not present

## 2015-10-03 DIAGNOSIS — Z923 Personal history of irradiation: Secondary | ICD-10-CM | POA: Diagnosis not present

## 2015-10-03 DIAGNOSIS — N289 Disorder of kidney and ureter, unspecified: Secondary | ICD-10-CM | POA: Diagnosis not present

## 2015-10-03 DIAGNOSIS — E782 Mixed hyperlipidemia: Secondary | ICD-10-CM | POA: Insufficient documentation

## 2015-10-03 DIAGNOSIS — C50919 Malignant neoplasm of unspecified site of unspecified female breast: Secondary | ICD-10-CM | POA: Insufficient documentation

## 2015-10-03 DIAGNOSIS — Z807 Family history of other malignant neoplasms of lymphoid, hematopoietic and related tissues: Secondary | ICD-10-CM | POA: Diagnosis not present

## 2015-10-03 DIAGNOSIS — Z7982 Long term (current) use of aspirin: Secondary | ICD-10-CM | POA: Insufficient documentation

## 2015-10-03 DIAGNOSIS — Z9221 Personal history of antineoplastic chemotherapy: Secondary | ICD-10-CM | POA: Diagnosis not present

## 2015-10-03 DIAGNOSIS — K5730 Diverticulosis of large intestine without perforation or abscess without bleeding: Secondary | ICD-10-CM | POA: Insufficient documentation

## 2015-10-03 DIAGNOSIS — I493 Ventricular premature depolarization: Secondary | ICD-10-CM | POA: Insufficient documentation

## 2015-10-03 DIAGNOSIS — Z803 Family history of malignant neoplasm of breast: Secondary | ICD-10-CM | POA: Diagnosis not present

## 2015-10-03 DIAGNOSIS — Z91041 Radiographic dye allergy status: Secondary | ICD-10-CM

## 2015-10-03 DIAGNOSIS — M5134 Other intervertebral disc degeneration, thoracic region: Secondary | ICD-10-CM | POA: Insufficient documentation

## 2015-10-03 LAB — CBC WITH DIFFERENTIAL/PLATELET
BASOS ABS: 0.1 10*3/uL (ref 0–0.1)
BASOS PCT: 1 %
EOS ABS: 0.1 10*3/uL (ref 0–0.7)
EOS PCT: 1 %
HCT: 38.9 % (ref 35.0–47.0)
Hemoglobin: 13.1 g/dL (ref 12.0–16.0)
LYMPHS PCT: 77 %
Lymphs Abs: 12.5 10*3/uL — ABNORMAL HIGH (ref 1.0–3.6)
MCH: 30.1 pg (ref 26.0–34.0)
MCHC: 33.6 g/dL (ref 32.0–36.0)
MCV: 89.6 fL (ref 80.0–100.0)
MONO ABS: 0.6 10*3/uL (ref 0.2–0.9)
Monocytes Relative: 4 %
Neutro Abs: 3 10*3/uL (ref 1.4–6.5)
Neutrophils Relative %: 19 %
PLATELETS: 150 10*3/uL (ref 150–440)
RBC: 4.34 MIL/uL (ref 3.80–5.20)
RDW: 13.8 % (ref 11.5–14.5)
WBC: 16.2 10*3/uL — AB (ref 3.6–11.0)

## 2015-10-03 LAB — LACTATE DEHYDROGENASE: LDH: 139 U/L (ref 98–192)

## 2015-10-03 MED ORDER — PREDNISONE 50 MG PO TABS: ORAL_TABLET | ORAL | Status: DC

## 2015-10-03 NOTE — Progress Notes (Signed)
Patient was scheduled in [facility] for CT scan. Apparently, the patient is allergic to shellfish, which causes hives and swelling. She states that is allergic to iodine, but can not confirm an IVP/CT scan dye allergy. She has had a ct scan in the past back in 1980's after her breast cancer dx.  She states that she can not remember if she has a 'true ct scan dye allergy'.  Dr. Rogue Bussing and I discussed the patient's care with radiology. Radiology states that the dye prep is no needed per the protocol and prefers that the patient be scheduled in [place]. Dr. Rogue Bussing would like the patient to still take the dye allergy prep with prednisone 50 mg 1 tablet 13 hours prior to the ct scan, 7 hours prior to the scan and 1 hour prior to the scan.  The patient was also instructed to take Benadryl 25 mg orally once 1 hour prior to the scan and Zantac 150 mg orally 1 tablet once 1 hour prior to the procedure.  These instructions were reviewed with the patient, who gave verbal understanding. Teach back process was performed with the patient.

## 2015-10-03 NOTE — Progress Notes (Signed)
Patient states that she has noticed some intermittent enlarged lymph nodes in her neck bilaterally. She reports an unproductive "hacking" cough x 6 weeks. She is up to date on her yearly mammograms. She will be scheduled for a colonoscopy at [facility]. She has had her influenza injection at her primary care provider's office; she is up to date on her pneumovac (given in 2015).  Patient provided with information on Adv. Directives-Less than 5 minutes spent in discussion.

## 2015-10-03 NOTE — Progress Notes (Signed)
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  female noted to have elevated white count on routine labs with the diagnosis of possible CLL has been deferred was for further evaluation/management. Patient states that she was told to have elevated white count with at least 2-3 years. Most recent white count in the beginning of October was around 15,000; however on a repeat blood work 2 weeks later the white count had jumped to 43,000; and the lymphocyte count had gone up to 37,000. Patient's hemoglobin platelets within normal limits. Patient states that she had a recent viral infection/cough for which she had recently seen a pulmonologist.  Patient denies any weight loss. Denies any night sweats. Denies any lumps or bumps. Denies any frequent infections or pneumonias needing Admission the hospital/antibiotics.  ROS: A complete 10 point review of system is done which is negative except mentioned above in history of present illness  MEDICAL HISTORY:  Past Medical History  Diagnosis Date  . Leukocytosis 01/2014  . Diabetes ([place])   . Hypertension   . Cataracts, bilateral   . GERD (gastroesophageal reflux disease)   . Osteopenia   . Breast cancer ([place]) 1983    had double mastectomy    SURGICAL HISTORY: Past Surgical History  Procedure Laterality Date  . Appendectomy    . Double mastectomy with breast rescontruction  1983  . Colonoscopy      by Dr. Vira Agar over 5 years ago    SOCIAL HISTORY: lives in [place].  Social History   Social History  . Marital Status: Married    Spouse Name: N/A  . Number of Children: N/A  . Years of Education: N/A   Occupational History  .  Not on file.   Social History Main Topics  . Smoking status: Never Smoker   . Smokeless tobacco: Never Used  . Alcohol Use: No  . Drug Use: No  . Sexual Activity:    Partners: Male   Other Topics Concern  . Not on file   Social History Narrative    FAMILY HISTORY: Family History  Problem Relation Age of Onset  . Non-Hodgkin's lymphoma Mother 13  . Leukemia Father 93  . Colon cancer Paternal Grandmother     age unknown  . Breast cancer Cousin     paternal cancer    ALLERGIES:  is allergic to statins; iodine; and sulfa antibiotics.  MEDICATIONS:  Current Outpatient Prescriptions  Medication Sig Dispense Refill  . ALPRAZolam (XANAX) 0.25 MG tablet Take 0.25 mg by mouth at bedtime as needed. anxiety    . aspirin EC 81 MG tablet Take 1 tablet by mouth daily.    . Calcium Carbonate-Vitamin D 600-400 MG-UNIT tablet Take 1 tablet by mouth 2 (two) times daily.    . citalopram (CELEXA) 40 MG tablet Take 40 mg by mouth daily.    . Cyanocobalamin (RA VITAMIN B-12 TR) 1000 MCG TBCR Take 1,000 mcg by mouth at bedtime.    . dicyclomine (BENTYL) 10 MG capsule Take 10 mg by mouth daily.    . diphenhydrAMINE (BENADRYL) 25 mg capsule Take 1 capsule by mouth every 6 (six) hours as needed. allergies    . fluticasone (FLONASE) 50  MCG/ACT nasal spray Place 1 spray into both nostrils daily.    Marland Kitchen gabapentin (NEURONTIN) 300 MG capsule Take 1 capsule by mouth as needed. Restless leg    . glucose blood test strip     . lisinopril (PRINIVIL,ZESTRIL) 2.5 MG tablet Take 1 tablet by mouth daily.    . Multiple Vitamins-Minerals (MULTIVITAMIN GUMMIES ADULT PO) Take 1 each by mouth daily.    . naproxen sodium (RA NAPROXEN SODIUM) 220 MG tablet Take 2 tablets by mouth daily.    Marland Kitchen omeprazole (PRILOSEC) 20 MG capsule Take 1 capsule by mouth 2 (two) times daily.    . pravastatin (PRAVACHOL) 10 MG tablet Take 1 tablet by mouth at bedtime.    Marland Kitchen Propylene Glycol 0.95 % SOLN Apply 1 drop to eye daily.    Marland Kitchen  SPIRIVA HANDIHALER 18 MCG inhalation capsule Place 1 capsule into inhaler and inhale daily.    . traZODone (DESYREL) 50 MG tablet Take 1 tablet by mouth at bedtime.     No current facility-administered medications for this visit.      Marland Kitchen  PHYSICAL EXAMINATION: ECOG PERFORMANCE STATUS: 0 - Asymptomatic  Filed Vitals:   10/03/15 1115  BP: 122/69  Pulse: 72  Temp: 97.8 F (36.6 C)  Resp: 18   Filed Weights   10/03/15 1115  Weight: 154 lb (69.854 kg)    GENERAL: Well-nourished well-developed; Alert, no distress and comfortable. She is alone. EYES: no pallor or icterus OROPHARYNX: no thrush or ulceration; positive for dentures. NECK: supple, no masses felt LYMPH:  no palpable lymphadenopathy in the cervical, axillary or inguinal regions LUNGS: clear to auscultation and  No wheeze or crackles HEART/CVS: regular rate & rhythm and no murmurs; No lower extremity edema ABDOMEN: abdomen soft, non-tender and normal bowel sounds Musculoskeletal:no cyanosis of digits and no clubbing  PSYCH: alert & oriented x 3 with fluent speech NEURO: no focal motor/sensory deficits SKIN:  no rashes or significant lesions  LABORATORY DATA:  I have reviewed the data as listed Lab Results  Component Value Date   WBC 43.1* 09/22/2015   HGB 14.5 09/22/2015   HCT 44.1 09/22/2015   MCV 91.0 09/22/2015   PLT 195 09/22/2015    Recent Labs  09/22/15 1314  NA 138  K 4.4  CL 102  CO2 28  GLUCOSE 122*  BUN 13  CREATININE 0.81  CALCIUM 9.2  GFRNONAA >60  GFRAA >60  ALKPHOS 72    RADIOGRAPHIC STUDIES: I have personally reviewed the radiological images as listed and agreed with the findings in the report. No results found.  ASSESSMENT & PLAN:   # Leukocytosis/lymphocytosis-~37,000 absolute lymphocyte count with normal white count and platelet count. Patient is asymptomatic. 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 I would recommend checking peripheral blood flow cytometry; LDH;  also peripheral blood flow cytometry for FISH panel; also I GVH mutation testing. I reviewed the patient's recent peripheral smear- showed no blasts; only mature lymphocytes. I would also recommend CT chest abdomen pelvis. Patient has dye allergy; recommend standard premedication with Benadryl and prednisone.   # I discussed in detail that she would be recommended treatment only she if she is symptomatic with night sweats;profound weight loss or intercurrent infection/fevers; or if she has significant cytopenias. She also understands that if and when she requires treatment- she has options of pills/ ibrutinib and also antibody therapy with gazyva.   All questions were answered. The patient knows to call the clinic with any problems, questions or  concerns. Patient will follow-up with me in the clinic in approximately 2 weeks to review the above workup/findings.  Thank you Dr. Iona Beard for allowing me to participate in the care of your pleasant patient. Please do not hesitate to contact me with questions or concerns in the interim.  I spent 40 minutes counseling the patient face to face. The total time spent in the appointment was 60 minutes and more than 50% was on counseling.     Cammie Sickle, MD 10/03/2015 12:38 PM

## 2015-10-03 NOTE — Patient Instructions (Signed)
Leukocytosis Leukocytosis means you have more white blood cells than normal. White blood cells are made in your bone marrow. The main job of white blood cells is to fight infection. Having too many white blood cells is a common condition. It can develop as a result of many types of medical problems. CAUSES  In some cases, your bone marrow may be normal, but it is still making too many white blood cells. This could be the result of:  Infection.  Injury.  Physical stress.  Emotional stress.  Surgery.  Allergic reactions.  Tumors that do not start in the blood or bone marrow.  An inherited disease.  Certain medicines.  Pregnancy and labor. In other cases, you may have a bone marrow disorder that is causing your body to make too many white blood cells. Bone marrow disorders include:  Leukemia. This is a type of blood cancer.  Myeloproliferative disorders. These disorders cause blood cells to grow abnormally. SYMPTOMS  Some people have no symptoms. Others have symptoms due to the medical problem that is causing their leukocytosis. These symptoms may include:  Bleeding.  Bruising.  Fever.  Night sweats.  Repeated infections.  Weakness.  Weight loss. DIAGNOSIS  Leukocytosis is often found during blood tests that are done as part of a normal physical exam. Your caregiver will probably order other tests to help determine why you have too many white blood cells. These tests may include:  A complete blood count (CBC). This test measures all the types of blood cells in your body.  Chest X-rays, urine tests (urinalysis), or other tests to look for signs of infection.  Bone marrow aspiration. For this test, a needle is put into your bone. Cells from the bone marrow are removed through the needle. The cells are then examined under a microscope. TREATMENT  Treatment is usually not needed for leukocytosis. However, if a disorder is causing your leukocytosis, it will need to be  treated. Treatment may include:  Antibiotic medicines if you have a bacterial infection.  Bone marrow transplant. Your diseased bone marrow is replaced with healthy cells that will grow new bone marrow.  Chemotherapy. This is the use of drugs to kill cancer cells. HOME CARE INSTRUCTIONS  Only take over-the-counter or prescription medicines as directed by your caregiver.  Maintain a healthy weight. Ask your caregiver what weight is best for you.  Eat foods that are low in saturated fats and high in fiber. Eat plenty of fruits and vegetables.  Drink enough fluids to keep your urine clear or pale yellow.  Get 30 minutes of exercise at least 5 times a week. Check with your caregiver before starting a new exercise routine.  Limit caffeine and alcohol.  Do not smoke.  Keep all follow-up appointments as directed by your caregiver. SEEK MEDICAL CARE IF:  You feel weak or more tired than usual.  You develop chills, a cough, or nasal congestion.  You lose weight without trying.  You have night sweats.  You bruise easily. SEEK IMMEDIATE MEDICAL CARE IF:  You bleed more than normal.  You have chest pain.  You have trouble breathing.  You have a fever.  You have uncontrolled nausea or vomiting.  You feel dizzy or lightheaded. MAKE SURE YOU:  Understand these instructions.  Will watch your condition.  Will get help right away if you are not doing well or get worse.   This information is not intended to replace advice given to you by your health care provider.  Make sure you discuss any questions you have with your health care provider.   Document Released: 11/07/2011 Document Revised: 02/10/2012 Document Reviewed: 05/22/2015 Elsevier Interactive Patient Education [employer].

## 2015-10-04 LAB — IGG, IGA, IGM
IgA: 71 mg/dL (ref 64–422)
IgG (Immunoglobin G), Serum: 631 mg/dL — ABNORMAL LOW (ref 700–1600)
IgM, Serum: 62 mg/dL (ref 26–217)

## 2015-10-06 ENCOUNTER — Ambulatory Visit: Payer: Medicare Other

## 2015-10-07 LAB — COMP PANEL: LEUKEMIA/LYMPHOMA

## 2015-10-09 ENCOUNTER — Encounter: Payer: Self-pay | Admitting: Internal Medicine

## 2015-10-13 ENCOUNTER — Ambulatory Visit
Admission: RE | Admit: 2015-10-13 | Discharge: 2015-10-13 | Disposition: A | Discharge status: Home / Self Care | Payer: Medicare Other | Source: Ambulatory Visit | Attending: Internal Medicine | Admitting: Internal Medicine

## 2015-10-13 DIAGNOSIS — R590 Localized enlarged lymph nodes: Secondary | ICD-10-CM | POA: Insufficient documentation

## 2015-10-13 DIAGNOSIS — N281 Cyst of kidney, acquired: Secondary | ICD-10-CM | POA: Diagnosis not present

## 2015-10-13 DIAGNOSIS — I700 Atherosclerosis of aorta: Secondary | ICD-10-CM | POA: Insufficient documentation

## 2015-10-13 DIAGNOSIS — K5730 Diverticulosis of large intestine without perforation or abscess without bleeding: Secondary | ICD-10-CM | POA: Insufficient documentation

## 2015-10-13 DIAGNOSIS — C9110 Chronic lymphocytic leukemia of B-cell type not having achieved remission: Secondary | ICD-10-CM

## 2015-10-13 LAB — FISH HES LEUKEMIA, 4Q12 REA

## 2015-10-13 MED ORDER — IOHEXOL 300 MG/ML  SOLN: 100.0000 mL | Freq: Once | INTRAMUSCULAR | Status: DC | PRN

## 2015-10-17 ENCOUNTER — Inpatient Hospital Stay (HOSPITAL_BASED_OUTPATIENT_CLINIC_OR_DEPARTMENT_OTHER): Payer: Medicare Other | Admitting: Internal Medicine

## 2015-10-17 DIAGNOSIS — R599 Enlarged lymph nodes, unspecified: Secondary | ICD-10-CM

## 2015-10-17 DIAGNOSIS — Z800 Family history of malignant neoplasm of digestive organs: Secondary | ICD-10-CM

## 2015-10-17 DIAGNOSIS — Z803 Family history of malignant neoplasm of breast: Secondary | ICD-10-CM

## 2015-10-17 DIAGNOSIS — D72829 Elevated white blood cell count, unspecified: Secondary | ICD-10-CM

## 2015-10-17 DIAGNOSIS — E119 Type 2 diabetes mellitus without complications: Secondary | ICD-10-CM

## 2015-10-17 DIAGNOSIS — I2510 Atherosclerotic heart disease of native coronary artery without angina pectoris: Secondary | ICD-10-CM | POA: Diagnosis not present

## 2015-10-17 DIAGNOSIS — Z807 Family history of other malignant neoplasms of lymphoid, hematopoietic and related tissues: Secondary | ICD-10-CM

## 2015-10-17 DIAGNOSIS — M5134 Other intervertebral disc degeneration, thoracic region: Secondary | ICD-10-CM

## 2015-10-17 DIAGNOSIS — M8580 Other specified disorders of bone density and structure, unspecified site: Secondary | ICD-10-CM

## 2015-10-17 DIAGNOSIS — N289 Disorder of kidney and ureter, unspecified: Secondary | ICD-10-CM

## 2015-10-17 DIAGNOSIS — Z923 Personal history of irradiation: Secondary | ICD-10-CM

## 2015-10-17 DIAGNOSIS — Z9221 Personal history of antineoplastic chemotherapy: Secondary | ICD-10-CM

## 2015-10-17 DIAGNOSIS — Z9013 Acquired absence of bilateral breasts and nipples: Secondary | ICD-10-CM

## 2015-10-17 DIAGNOSIS — Z853 Personal history of malignant neoplasm of breast: Secondary | ICD-10-CM

## 2015-10-17 DIAGNOSIS — C9110 Chronic lymphocytic leukemia of B-cell type not having achieved remission: Secondary | ICD-10-CM

## 2015-10-17 DIAGNOSIS — I10 Essential (primary) hypertension: Secondary | ICD-10-CM

## 2015-10-17 DIAGNOSIS — K5730 Diverticulosis of large intestine without perforation or abscess without bleeding: Secondary | ICD-10-CM

## 2015-10-17 DIAGNOSIS — Z7982 Long term (current) use of aspirin: Secondary | ICD-10-CM

## 2015-10-17 DIAGNOSIS — K219 Gastro-esophageal reflux disease without esophagitis: Secondary | ICD-10-CM

## 2015-10-17 DIAGNOSIS — Z79899 Other long term (current) drug therapy: Secondary | ICD-10-CM

## 2015-10-17 NOTE — Progress Notes (Signed)
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   # 1983- Hx of Right breast ca?; s/p Mastecomy bil [as per pt]; no anti-hormonal therapy/chemo/RT  HISTORY OF PRESENTING ILLNESS:  Melissa Curtis 74 y.o.  female history of leukocytosis/lymphocytosis is here to review the results of her blood work/imaging.  Patient admits to have a colonoscopy yesterday. She is still awaiting the results of the testing.  Patient denies any weight loss. Denies any night sweats. Denies any lumps or bumps. Denies any frequent infections or pneumonias needing Admission the hospital/antibiotics.  ROS: A complete 10 point review of system is done which is negative except mentioned above in history of present illness  MEDICAL HISTORY:  Past Medical History  Diagnosis Date  . Leukocytosis 01/2014  . Diabetes ([place])   . Hypertension   . Cataracts, bilateral   . GERD (gastroesophageal reflux disease)   . Osteopenia   . Breast cancer ([place]) 1983    had double mastectomy    SURGICAL HISTORY: Past Surgical History  Procedure Laterality Date  . Appendectomy    . Double mastectomy with breast rescontruction  1983  . Colonoscopy      by Dr. Vira Agar over 5 years ago    SOCIAL HISTORY: lives in [place].  Social History   Social History  . Marital Status: Married    Spouse Name: N/A  . Number of Children: N/A  . Years of Education: N/A   Occupational History  . Not on file.   Social History Main Topics  . Smoking status: Never Smoker   . Smokeless tobacco: Never Used  . Alcohol Use: No  . Drug Use: No  . Sexual Activity:    Partners: Male   Other Topics Concern  . Not on file   Social History Narrative    FAMILY HISTORY: Family History  Problem Relation Age of  Onset  . Non-Hodgkin's lymphoma Mother 33  . Leukemia Father 27  . Colon cancer Paternal Grandmother     age unknown  . Breast cancer Cousin     paternal cancer    ALLERGIES:  is allergic to statins; iodine; shellfish allergy; and sulfa antibiotics.  MEDICATIONS:  Current Outpatient Prescriptions  Medication Sig Dispense Refill  . ALPRAZolam (XANAX) 0.25 MG tablet Take 0.25 mg by mouth at bedtime as needed. anxiety    . aspirin EC 81 MG tablet Take 1 tablet by mouth daily.    . Calcium Carbonate-Vitamin D 600-400 MG-UNIT tablet Take 1 tablet by mouth 2 (two) times daily.    . Cyanocobalamin (RA VITAMIN B-12 TR) 1000 MCG TBCR Take 1,000 mcg by mouth at bedtime.    . dicyclomine (BENTYL) 10 MG capsule Take 10 mg by mouth daily.    . diphenhydrAMINE (BENADRYL) 25 mg capsule Take 1 capsule by mouth every 6 (six) hours as needed. allergies    . fluticasone (FLONASE) 50 MCG/ACT nasal spray Place 1 spray into both nostrils daily.    Marland Kitchen glucose blood test strip     . lisinopril (PRINIVIL,ZESTRIL) 2.5 MG tablet Take 1 tablet by mouth daily.    . Multiple Vitamins-Minerals (MULTIVITAMIN GUMMIES ADULT PO) Take 1 each by mouth daily.    . naproxen sodium (RA NAPROXEN SODIUM) 220 MG tablet Take 2 tablets  by mouth daily.    Marland Kitchen omeprazole (PRILOSEC) 20 MG capsule Take 1 capsule by mouth 2 (two) times daily.    . pravastatin (PRAVACHOL) 10 MG tablet Take 1 tablet by mouth at bedtime.    Marland Kitchen SPIRIVA HANDIHALER 18 MCG inhalation capsule Place 1 capsule into inhaler and inhale daily.    . traZODone (DESYREL) 50 MG tablet Take 1 tablet by mouth at bedtime.    . citalopram (CELEXA) 40 MG tablet Take 40 mg by mouth daily.     No current facility-administered medications for this visit.      Marland Kitchen  PHYSICAL EXAMINATION: ECOG PERFORMANCE STATUS: 0 - Asymptomatic  There were no vitals filed for this visit. There were no vitals filed for this visit.  GENERAL: Well-nourished well-developed; Alert, no  distress and comfortable. She is alone.   LABORATORY DATA:  I have reviewed the data as listed Lab Results  Component Value Date   WBC 16.2* 10/03/2015   HGB 13.1 10/03/2015   HCT 38.9 10/03/2015   MCV 89.6 10/03/2015   PLT 150 10/03/2015    Recent Labs  09/22/15 1314  NA 138  K 4.4  CL 102  CO2 28  GLUCOSE 122*  BUN 13  CREATININE 0.81  CALCIUM 9.2  GFRNONAA >60  GFRAA >60  ALKPHOS 72    RADIOGRAPHIC STUDIES: I have personally reviewed the radiological images as listed and agreed with the findings in the report. Ct Chest W Contrast  10/13/2015  CLINICAL DATA:  Elevated white count.  Possible diagnosis of CLL. EXAM: CT CHEST, ABDOMEN, AND PELVIS WITH CONTRAST TECHNIQUE: Multidetector CT imaging of the chest, abdomen and pelvis was performed following the standard protocol during bolus administration of intravenous contrast. CONTRAST:  100 cc of Omnipaque 300 COMPARISON:  None FINDINGS: CT CHEST FINDINGS Mediastinum/Nodes: The heart size appears normal. No pericardial effusion. Aortic atherosclerosis noted. Calcification within the LAD coronary artery noted. The trachea is patent and appears midline. Normal appearance of the esophagus. Subcarinal lymph node is within normal limits in size measuring 7 mm. There is a right paratracheal lymph node which is prominent mesh 1 cm. No hilar adenopathy. No axillary or supraclavicular lymph nodes. Lungs/Pleura: No pleural effusion identified. There is no suspicious pulmonary parenchymal nodule or mass identified. Musculoskeletal: Mild degenerative disc disease within the thoracic spine. No aggressive lytic or sclerotic bone lesion. CT ABDOMEN PELVIS FINDINGS Hepatobiliary: No focal liver abnormality. The gallbladder is normal. No biliary dilatation. Pancreas: The pancreas appears normal. Spleen: The spleen appears normal measuring 7.5 cm in length. Adrenals/Urinary Tract: The adrenal glands are normal. The left kidney appears normal. Small  cyst arises from the inferior pole the right kidney measuring 1 cm. The urinary bladder appears normal. Stomach/Bowel: The stomach appears normal. The small bowel loops have a normal course and caliber. No obstruction. Normal appearance of the proximal colon. Numerous distal colonic diverticula identified. Vascular/Lymphatic: Calcified atherosclerotic disease involves the abdominal aorta. No aneurysm. There is no adenopathy identified within the upper abdomen. Prominent external iliac lymph nodes are identified. On the right index node measures 9 mm, image 101 of series 2. On the left this measures 9 mm. No inguinal adenopathy. Reproductive: The uterus and adnexal structures are unremarkable for patient's age. Other: No free fluid or fluid collections within the abdomen or pelvis. Musculoskeletal: No aggressive lytic or sclerotic bone lesions noted. Did mild degenerative changes in the lumbar spine. IMPRESSION: 1. Borderline enlarged right paratracheal lymph node and bilateral external iliac lymph nodes. Examination  is otherwise unremarkable. 2. Normal size spleen 3. Aortic atherosclerosis 4. Right kidney cysts. 5. Distal colonic diverticulosis. Electronically Signed   By: Kerby Moors M.D.   On: 10/13/2015 16:36   Ct Abdomen Pelvis W Contrast  10/13/2015  CLINICAL DATA:  Elevated white count.  Possible diagnosis of CLL. EXAM: CT CHEST, ABDOMEN, AND PELVIS WITH CONTRAST TECHNIQUE: Multidetector CT imaging of the chest, abdomen and pelvis was performed following the standard protocol during bolus administration of intravenous contrast. CONTRAST:  100 cc of Omnipaque 300 COMPARISON:  None FINDINGS: CT CHEST FINDINGS Mediastinum/Nodes: The heart size appears normal. No pericardial effusion. Aortic atherosclerosis noted. Calcification within the LAD coronary artery noted. The trachea is patent and appears midline. Normal appearance of the esophagus. Subcarinal lymph node is within normal limits in size measuring 7  mm. There is a right paratracheal lymph node which is prominent mesh 1 cm. No hilar adenopathy. No axillary or supraclavicular lymph nodes. Lungs/Pleura: No pleural effusion identified. There is no suspicious pulmonary parenchymal nodule or mass identified. Musculoskeletal: Mild degenerative disc disease within the thoracic spine. No aggressive lytic or sclerotic bone lesion. CT ABDOMEN PELVIS FINDINGS Hepatobiliary: No focal liver abnormality. The gallbladder is normal. No biliary dilatation. Pancreas: The pancreas appears normal. Spleen: The spleen appears normal measuring 7.5 cm in length. Adrenals/Urinary Tract: The adrenal glands are normal. The left kidney appears normal. Small cyst arises from the inferior pole the right kidney measuring 1 cm. The urinary bladder appears normal. Stomach/Bowel: The stomach appears normal. The small bowel loops have a normal course and caliber. No obstruction. Normal appearance of the proximal colon. Numerous distal colonic diverticula identified. Vascular/Lymphatic: Calcified atherosclerotic disease involves the abdominal aorta. No aneurysm. There is no adenopathy identified within the upper abdomen. Prominent external iliac lymph nodes are identified. On the right index node measures 9 mm, image 101 of series 2. On the left this measures 9 mm. No inguinal adenopathy. Reproductive: The uterus and adnexal structures are unremarkable for patient's age. Other: No free fluid or fluid collections within the abdomen or pelvis. Musculoskeletal: No aggressive lytic or sclerotic bone lesions noted. Did mild degenerative changes in the lumbar spine. IMPRESSION: 1. Borderline enlarged right paratracheal lymph node and bilateral external iliac lymph nodes. Examination is otherwise unremarkable. 2. Normal size spleen 3. Aortic atherosclerosis 4. Right kidney cysts. 5. Distal colonic diverticulosis. Electronically Signed   By: Kerby Moors M.D.   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 CT chest and pelvis- does not reveal any significant lymphadenopathy or hepatosplenomegaly. Patient has nonpathologic lymph nodes in the mediastinum/pelvis.  # Again discussed with the patient that she does not need any treatment unless she gets symptomatic with night sweats;profound weight loss or intercurrent infection/fevers; or if she has significant cytopenias.   # She also understands that if and when she requires treatment- she has options of pills/ ibrutinib and also antibody therapy with gazyva.  # I reviewed the blood work- with the patient in detail; also reviewed the imaging independently [as summarized above]; and with the patient in detail.   # Patient will follow-up in the office in 4 months; or sooner if change in clinical status.  I spent 30 minutes face-to-face with the patient in reviewing the above labs/scans/diagnosis and prognosis; more than 50% of time spent on counseling and coordination of above medical care.     Lenetta Quaker R  Rogue Bussing, MD 10/17/2015 9:32 AM

## 2015-10-19 LAB — MISC LABCORP TEST (SEND OUT): LABCORP TEST CODE: 113753

## 2015-10-20 DIAGNOSIS — D126 Benign neoplasm of colon, unspecified: Secondary | ICD-10-CM | POA: Insufficient documentation

## 2016-02-13 ENCOUNTER — Encounter: Payer: Self-pay | Admitting: Internal Medicine

## 2016-02-13 ENCOUNTER — Inpatient Hospital Stay: Payer: Medicare Other | Attending: Internal Medicine

## 2016-02-13 ENCOUNTER — Inpatient Hospital Stay (HOSPITAL_BASED_OUTPATIENT_CLINIC_OR_DEPARTMENT_OTHER): Payer: Medicare Other | Admitting: Internal Medicine

## 2016-02-13 VITALS — BP 136/89 | HR 89 | Resp 18 | Ht 63.0 in | Wt 152.3 lb

## 2016-02-13 DIAGNOSIS — I10 Essential (primary) hypertension: Secondary | ICD-10-CM

## 2016-02-13 DIAGNOSIS — Z7982 Long term (current) use of aspirin: Secondary | ICD-10-CM | POA: Insufficient documentation

## 2016-02-13 DIAGNOSIS — Z853 Personal history of malignant neoplasm of breast: Secondary | ICD-10-CM | POA: Diagnosis not present

## 2016-02-13 DIAGNOSIS — R42 Dizziness and giddiness: Secondary | ICD-10-CM | POA: Insufficient documentation

## 2016-02-13 DIAGNOSIS — D72829 Elevated white blood cell count, unspecified: Secondary | ICD-10-CM | POA: Diagnosis not present

## 2016-02-13 DIAGNOSIS — Z79899 Other long term (current) drug therapy: Secondary | ICD-10-CM | POA: Insufficient documentation

## 2016-02-13 DIAGNOSIS — K219 Gastro-esophageal reflux disease without esophagitis: Secondary | ICD-10-CM | POA: Insufficient documentation

## 2016-02-13 DIAGNOSIS — E119 Type 2 diabetes mellitus without complications: Secondary | ICD-10-CM | POA: Insufficient documentation

## 2016-02-13 DIAGNOSIS — Z800 Family history of malignant neoplasm of digestive organs: Secondary | ICD-10-CM

## 2016-02-13 DIAGNOSIS — Z803 Family history of malignant neoplasm of breast: Secondary | ICD-10-CM | POA: Insufficient documentation

## 2016-02-13 DIAGNOSIS — C9110 Chronic lymphocytic leukemia of B-cell type not having achieved remission: Secondary | ICD-10-CM

## 2016-02-13 DIAGNOSIS — Z9013 Acquired absence of bilateral breasts and nipples: Secondary | ICD-10-CM

## 2016-02-13 LAB — BASIC METABOLIC PANEL WITH GFR
Anion gap: 5 (ref 5–15)
BUN: 8 mg/dL (ref 6–20)
CO2: 27 mmol/L (ref 22–32)
Calcium: 8.4 mg/dL — ABNORMAL LOW (ref 8.9–10.3)
Chloride: 103 mmol/L (ref 101–111)
Creatinine, Ser: 0.85 mg/dL (ref 0.44–1.00)
GFR calc Af Amer: >60 mL/min
GFR calc non Af Amer: >60 mL/min
Glucose, Bld: 192 mg/dL — ABNORMAL HIGH (ref 65–99)
Potassium: 3.4 mmol/L — ABNORMAL LOW (ref 3.5–5.1)
Sodium: 135 mmol/L (ref 135–145)

## 2016-02-13 LAB — LACTATE DEHYDROGENASE: LDH: 143 U/L (ref 98–192)

## 2016-02-13 LAB — CBC WITH DIFFERENTIAL/PLATELET
Basophils Absolute: 0.1 K/uL (ref 0–0.1)
Basophils Relative: 1 %
Eosinophils Absolute: 0.1 K/uL (ref 0–0.7)
Eosinophils Relative: 1 %
HCT: 42.8 % (ref 35.0–47.0)
Hemoglobin: 14.3 g/dL (ref 12.0–16.0)
Lymphocytes Relative: 71 %
Lymphs Abs: 12.1 K/uL — ABNORMAL HIGH (ref 1.0–3.6)
MCH: 30.1 pg (ref 26.0–34.0)
MCHC: 33.4 g/dL (ref 32.0–36.0)
MCV: 90.2 fL (ref 80.0–100.0)
Monocytes Absolute: 0.6 K/uL (ref 0.2–0.9)
Monocytes Relative: 4 %
Neutro Abs: 3.9 K/uL (ref 1.4–6.5)
Neutrophils Relative %: 23 %
Platelets: 176 K/uL (ref 150–440)
RBC: 4.74 MIL/uL (ref 3.80–5.20)
RDW: 13.2 % (ref 11.5–14.5)
WBC: 16.8 K/uL — ABNORMAL HIGH (ref 3.6–11.0)

## 2016-02-13 NOTE — Progress Notes (Signed)
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   # 1983- Hx of Right breast ca?; s/p Mastecomy bil [as per pt]; no anti-hormonal therapy/chemo/RT  HISTORY OF PRESENTING ILLNESS:  Melissa Curtis 75 y.o.  female  Above history of newly diagnosed CLL is here  For follow-up.   Patient denies any new symptoms at this time-  Except for mild dizziness on standing. No fatigue.Patient denies any weight loss. Denies any night sweats. Denies any lumps or bumps. Denies any frequent infections or pneumonias.   ROS: A complete 10 point review of system is done which is negative except mentioned above in history of present illness  MEDICAL HISTORY:  Past Medical History  Diagnosis Date  . Leukocytosis 01/2014  . Diabetes ([place])   . Hypertension   . Cataracts, bilateral   . GERD (gastroesophageal reflux disease)   . Osteopenia   . Breast cancer ([place]) 1983    had double mastectomy    SURGICAL HISTORY: Past Surgical History  Procedure Laterality Date  . Appendectomy    . Double mastectomy with breast rescontruction  1983  . Colonoscopy      by Dr. Vira Agar over 5 years ago    SOCIAL HISTORY: lives in [place].  Social History   Social History  . Marital Status: Married    Spouse Name: N/A  . Number of Children: N/A  . Years of Education: N/A   Occupational History  . Not on file.   Social History Main Topics  . Smoking status: Never Smoker   . Smokeless tobacco: Never Used  . Alcohol Use: No  . Drug Use: No  . Sexual Activity:    Partners: Male   Other Topics Concern  . Not on file   Social History Narrative    FAMILY HISTORY: Family History  Problem Relation Age of Onset  . Non-Hodgkin's lymphoma Mother 58  . Leukemia Father  25  . Colon cancer Paternal Grandmother     age unknown  . Breast cancer Cousin     paternal cancer    ALLERGIES:  is allergic to statins; iodine; shellfish allergy; and sulfa antibiotics.  MEDICATIONS:  Current Outpatient Prescriptions  Medication Sig Dispense Refill  . ALPRAZolam (XANAX) 0.25 MG tablet Take 0.25 mg by mouth at bedtime as needed. anxiety    . aspirin EC 81 MG tablet Take 1 tablet by mouth daily.    . Cyanocobalamin (RA VITAMIN B-12 TR) 1000 MCG TBCR Take 1,000 mcg by mouth at bedtime.    . dicyclomine (BENTYL) 10 MG capsule Take 10 mg by mouth daily.    . diphenhydrAMINE (BENADRYL) 25 mg capsule Take 1 capsule by mouth every 6 (six) hours as needed. allergies    . fluticasone (FLONASE) 50 MCG/ACT nasal spray Place 1 spray into both nostrils daily.    Marland Kitchen glucose blood test strip     . lisinopril (PRINIVIL,ZESTRIL) 2.5 MG tablet Take 1 tablet by mouth daily.    . Multiple Vitamins-Minerals (MULTIVITAMIN GUMMIES ADULT PO) Take 1 each by mouth daily.    . naproxen sodium (RA NAPROXEN SODIUM) 220 MG tablet Take 2 tablets by mouth daily.    Marland Kitchen omeprazole (PRILOSEC) 20 MG capsule Take 1 capsule by mouth 2 (two) times daily.    Marland Kitchen  pravastatin (PRAVACHOL) 10 MG tablet Take 1 tablet by mouth at bedtime.    Marland Kitchen SPIRIVA HANDIHALER 18 MCG inhalation capsule Place 1 capsule into inhaler and inhale daily.    . traZODone (DESYREL) 50 MG tablet Take 1 tablet by mouth at bedtime.    . Calcium Carbonate-Vitamin D 600-400 MG-UNIT tablet Take 1 tablet by mouth 2 (two) times daily.     No current facility-administered medications for this visit.      Marland Kitchen  PHYSICAL EXAMINATION: ECOG PERFORMANCE STATUS: 0 - Asymptomatic  Filed Vitals:   02/13/16 0944  BP: 136/89  Pulse: 89  Resp: 18   Filed Weights   02/13/16 0944  Weight: 152 lb 5.4 oz (69.1 kg)    GENERAL: Well-nourished well-developed; Alert, no distress and comfortable. She is alone. EYES: no pallor or icterus OROPHARYNX: no  thrush or ulceration; positive for dentures. NECK: supple, no masses felt LYMPH:  no palpable lymphadenopathy in the cervical, axillary or inguinal regions LUNGS: clear to auscultation and  No wheeze or crackles HEART/CVS: regular rate & rhythm and no murmurs; No lower extremity edema ABDOMEN: abdomen soft, non-tender and normal bowel sounds Musculoskeletal:no cyanosis of digits and no clubbing  PSYCH: alert & oriented x 3 with fluent speech NEURO: no focal motor/sensory deficits SKIN:  no rashes or significant lesions  LABORATORY DATA:  I have reviewed the data as listed Lab Results  Component Value Date   WBC 16.8* 02/13/2016   HGB 14.3 02/13/2016   HCT 42.8 02/13/2016   MCV 90.2 02/13/2016   PLT 176 02/13/2016    Recent Labs  09/22/15 1314  NA 138  K 4.4  CL 102  CO2 28  GLUCOSE 122*  BUN 13  CREATININE 0.81  CALCIUM 9.2  GFRNONAA >60  GFRAA >60  ALKPHOS 72    ASSESSMENT & PLAN:   # CLL [IgVH MUTATED/13q del-FISH]~12 absolute lymphocyte count with normal white count and platelet count. Patient is asymptomatic.  I would recommend continued surveillance at this time.   #  I suspect patient will not need any treatments for many years/ unless patient gets symptomatic.  #  Patient follow-up with me in approximately 4 months with labs.  # 15 minutes face-to-face with the patient discussing the above plan of care; more than 50% of time spent on natural history; counseling and coordination.     Cammie Sickle, MD 02/13/2016 9:45 AM

## 2016-02-13 NOTE — Progress Notes (Signed)
Pt here for f/u of CLL observation. She is not having fevers,night sweats. No infections since last week. Eating and drinking good. Bowels are normal.

## 2016-02-17 IMAGING — CT CT ABD-PELV W/ CM
2 of 5 series · 14 of 46 positions shown, 16 images · IV contrast (omnipaque)
Comparison: None

CLINICAL DATA: Elevated white count.  Possible diagnosis of CLL.

EXAM:
CT CHEST, ABDOMEN, AND PELVIS WITH CONTRAST
TECHNIQUE: Multidetector CT imaging of the chest, abdomen and pelvis was
performed following the standard protocol during bolus
administration of intravenous contrast.
CONTRAST:  100 cc of Omnipaque 300

[Series 2: cap with · axial · 0.71mm/px · z∈[-116,+410]mm · 11 of 119 slices shown, 13 images]
[im 7/119  soft-tissue]
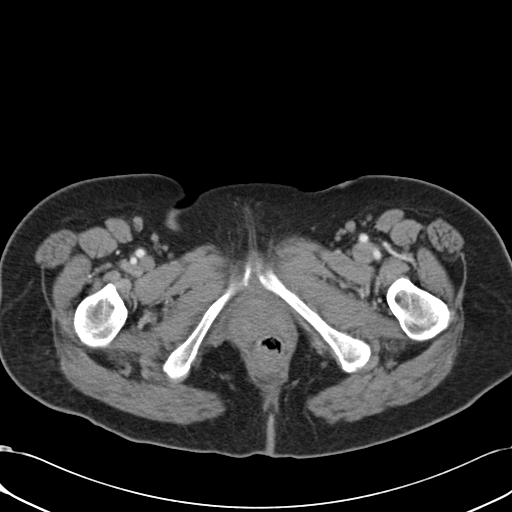
[im 7/119  bone]
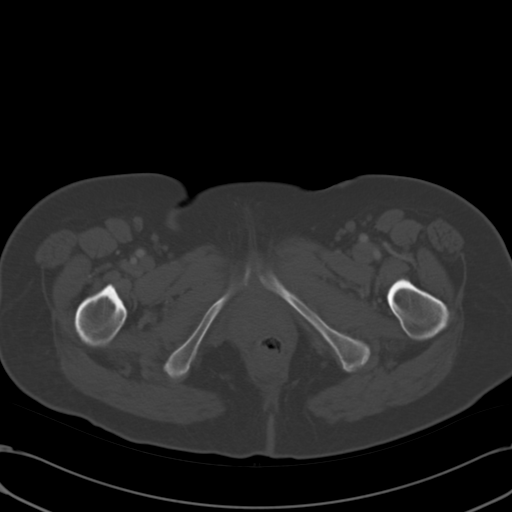
[im 19/119  soft-tissue]
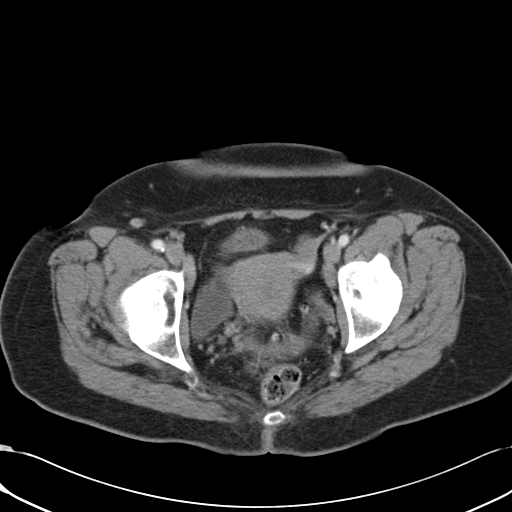
[im 32/119  soft-tissue]
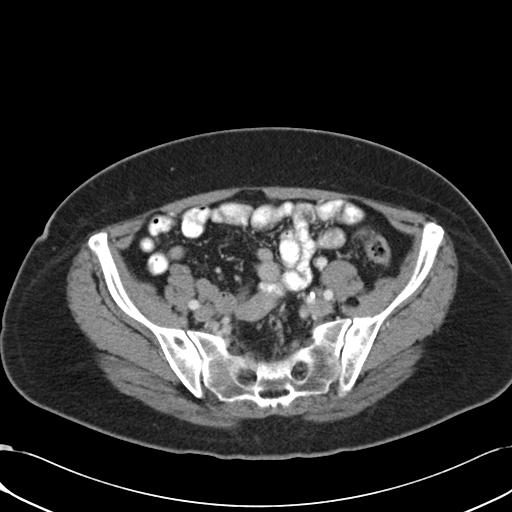
[im 38/119  soft-tissue]
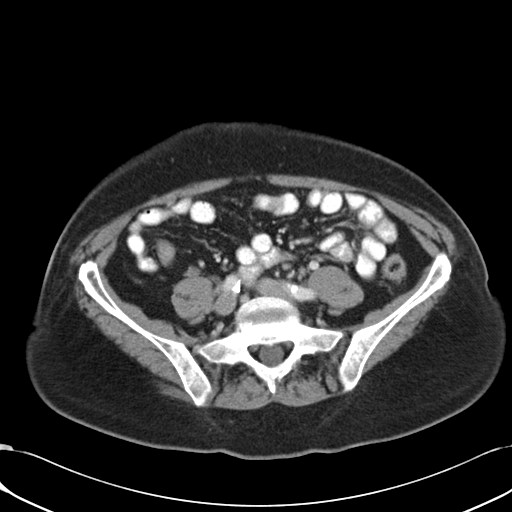
[im 50/119  soft-tissue]
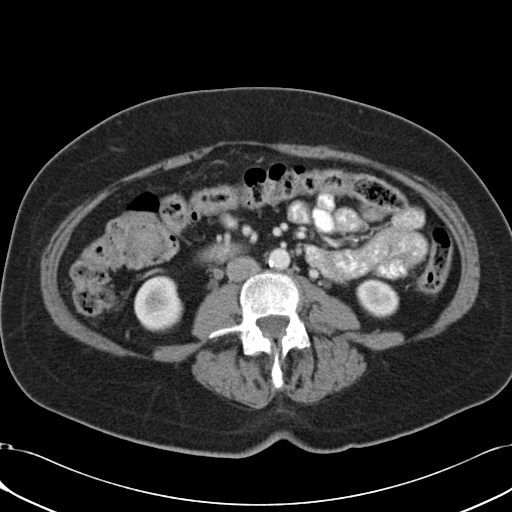
[im 63/119  soft-tissue]
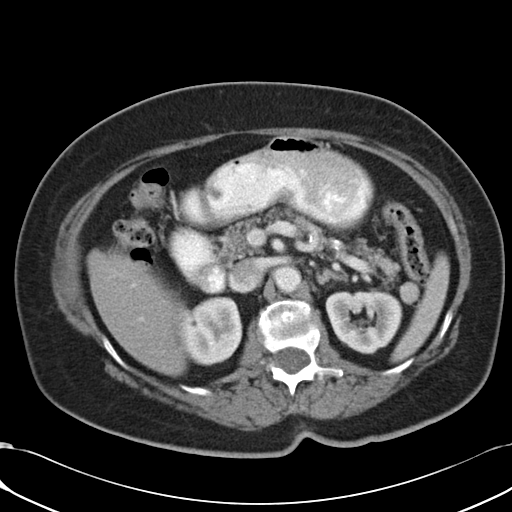
[im 69/119  soft-tissue]
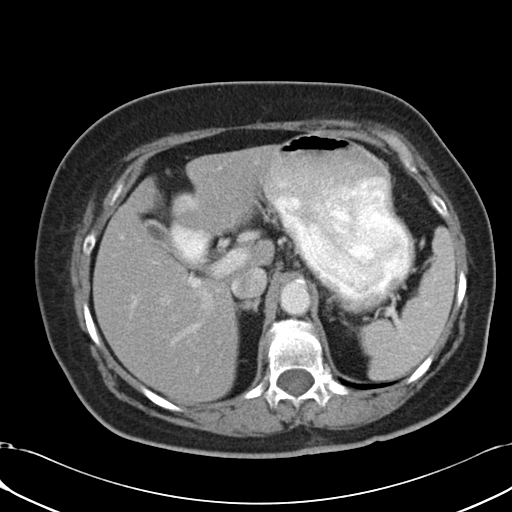
[im 81/119  soft-tissue]
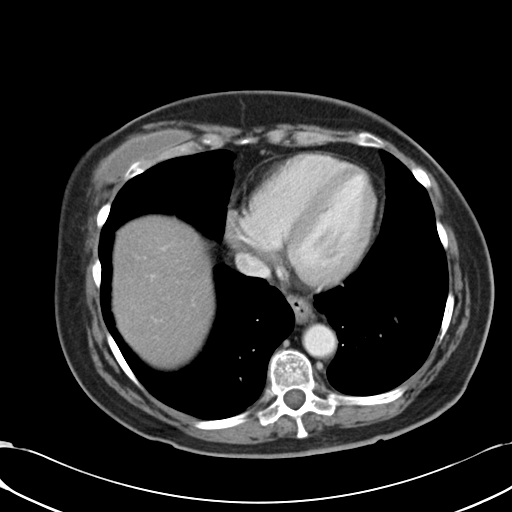
[im 87/119  soft-tissue]
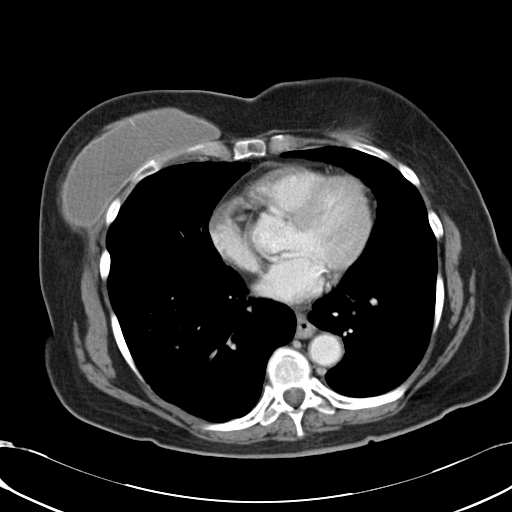
[im 87/119  bone]
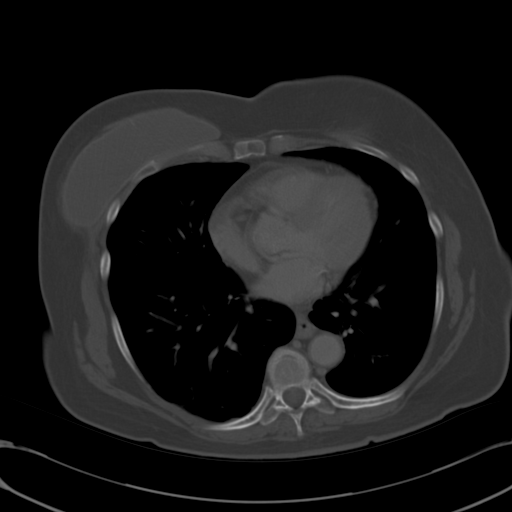
[im 100/119  soft-tissue]
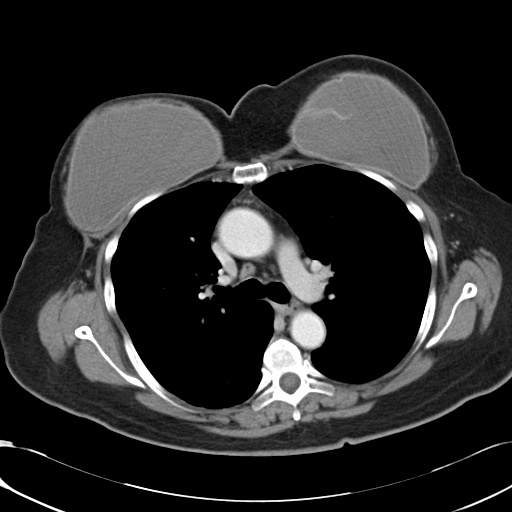
[im 112/119  soft-tissue]
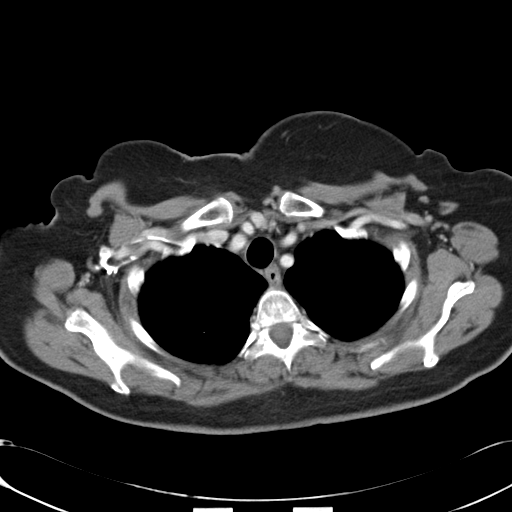

[Series 5: cor cap with cor cor · coronal · 0.62mm/px · 3 of 130 slices shown]
[im 44/130  soft-tissue]
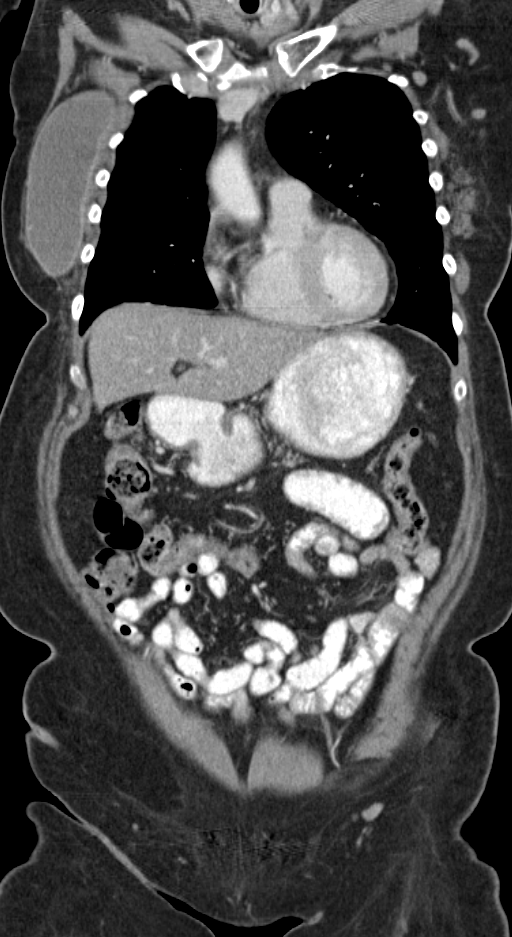
[im 58/130  soft-tissue]
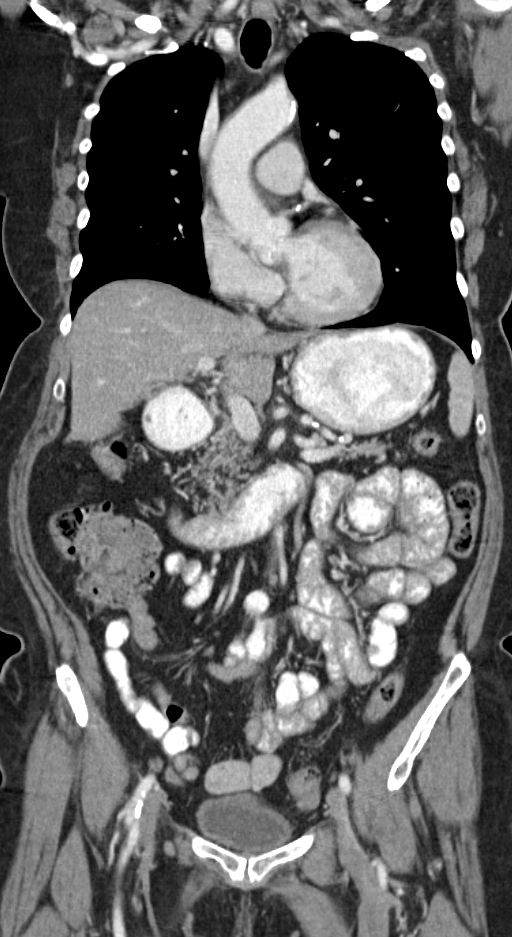
[im 72/130  soft-tissue]
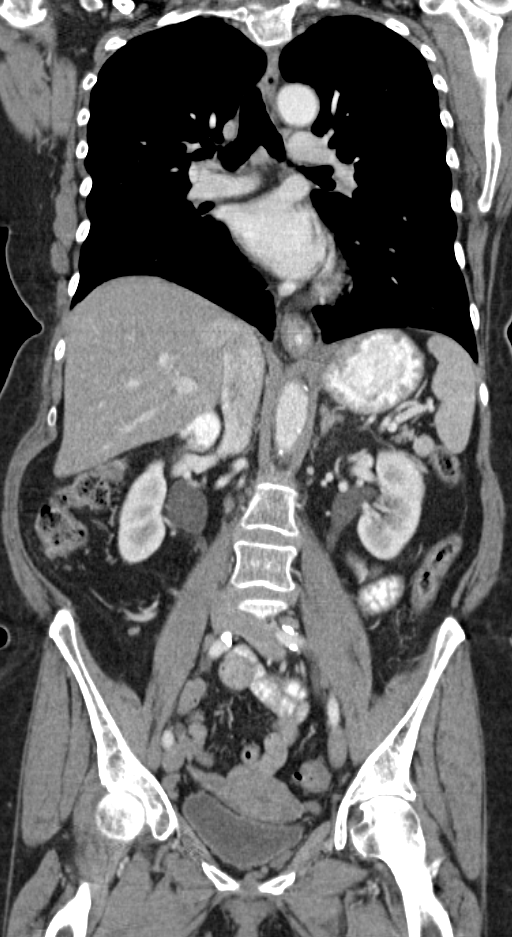

[14 of 46 positions shown; findings below may reference images not displayed]

FINDINGS: CT CHEST FINDINGS

Mediastinum/Nodes: The heart size appears normal. No pericardial
effusion. Aortic atherosclerosis noted. Calcification within the LAD
coronary artery noted. The trachea is patent and appears midline.
Normal appearance of the esophagus. Subcarinal lymph node is within
normal limits in size measuring 7 mm. There is a right paratracheal
lymph node which is prominent mesh 1 cm. No hilar adenopathy. No
axillary or supraclavicular lymph nodes.

Lungs/Pleura: No pleural effusion identified. There is no suspicious
pulmonary parenchymal nodule or mass identified.

Musculoskeletal: Mild degenerative disc disease within the thoracic
spine. No aggressive lytic or sclerotic bone lesion.

CT ABDOMEN PELVIS FINDINGS

Hepatobiliary: No focal liver abnormality. The gallbladder is
normal. No biliary dilatation.

Pancreas: The pancreas appears normal.

Spleen: The spleen appears normal measuring 7.5 cm in length.

Adrenals/Urinary Tract: The adrenal glands are normal. The left
kidney appears normal. Small cyst arises from the inferior pole the
right kidney measuring 1 cm. The urinary bladder appears normal.

Stomach/Bowel: The stomach appears normal. The small bowel loops
have a normal course and caliber. No obstruction. Normal appearance
of the proximal colon. Numerous distal colonic diverticula
identified.

Vascular/Lymphatic: Calcified atherosclerotic disease involves the
abdominal aorta. No aneurysm. There is no adenopathy identified
within the upper abdomen. Prominent external iliac lymph nodes are
identified. On the right index node measures 9 mm, image 101 of
series 2. On the left this measures 9 mm. No inguinal adenopathy.

Reproductive: The uterus and adnexal structures are unremarkable for
patient's age.

Other: No free fluid or fluid collections within the abdomen or
pelvis.

Musculoskeletal: No aggressive lytic or sclerotic bone lesions
noted. Did mild degenerative changes in the lumbar spine.
IMPRESSION: 1. Borderline enlarged right paratracheal lymph node and bilateral
external iliac lymph nodes. Examination is otherwise unremarkable.
2. Normal size spleen
3. Aortic atherosclerosis
4. Right kidney cysts.
5. Distal colonic diverticulosis.

## 2016-02-21 LAB — MISC LABCORP TEST (SEND OUT): Labcorp test code: 113753

## 2016-06-11 ENCOUNTER — Inpatient Hospital Stay: Payer: Medicare Other | Admitting: Internal Medicine

## 2016-06-11 ENCOUNTER — Inpatient Hospital Stay: Payer: Medicare Other | Attending: Internal Medicine

## 2016-07-16 ENCOUNTER — Inpatient Hospital Stay: Payer: Medicare Other | Attending: Internal Medicine | Admitting: Internal Medicine

## 2016-07-16 ENCOUNTER — Other Ambulatory Visit: Payer: Medicare Other

## 2016-07-16 ENCOUNTER — Inpatient Hospital Stay: Payer: Medicare Other

## 2016-07-16 ENCOUNTER — Other Ambulatory Visit: Payer: Self-pay

## 2016-07-16 DIAGNOSIS — E119 Type 2 diabetes mellitus without complications: Secondary | ICD-10-CM

## 2016-07-16 DIAGNOSIS — K219 Gastro-esophageal reflux disease without esophagitis: Secondary | ICD-10-CM

## 2016-07-16 DIAGNOSIS — Z79899 Other long term (current) drug therapy: Secondary | ICD-10-CM | POA: Diagnosis not present

## 2016-07-16 DIAGNOSIS — I10 Essential (primary) hypertension: Secondary | ICD-10-CM

## 2016-07-16 DIAGNOSIS — Z9013 Acquired absence of bilateral breasts and nipples: Secondary | ICD-10-CM

## 2016-07-16 DIAGNOSIS — M8580 Other specified disorders of bone density and structure, unspecified site: Secondary | ICD-10-CM | POA: Diagnosis not present

## 2016-07-16 DIAGNOSIS — B349 Viral infection, unspecified: Secondary | ICD-10-CM | POA: Diagnosis not present

## 2016-07-16 DIAGNOSIS — Z853 Personal history of malignant neoplasm of breast: Secondary | ICD-10-CM | POA: Diagnosis not present

## 2016-07-16 DIAGNOSIS — Z7982 Long term (current) use of aspirin: Secondary | ICD-10-CM | POA: Diagnosis not present

## 2016-07-16 DIAGNOSIS — D72829 Elevated white blood cell count, unspecified: Secondary | ICD-10-CM | POA: Diagnosis not present

## 2016-07-16 DIAGNOSIS — C9110 Chronic lymphocytic leukemia of B-cell type not having achieved remission: Secondary | ICD-10-CM | POA: Diagnosis present

## 2016-07-16 DIAGNOSIS — Z87891 Personal history of nicotine dependence: Secondary | ICD-10-CM | POA: Diagnosis not present

## 2016-07-16 LAB — CBC WITH DIFFERENTIAL/PLATELET
Basophils Absolute: 0.1 10*3/uL (ref 0–0.1)
Basophils Relative: 1 %
EOS ABS: 0.2 10*3/uL (ref 0–0.7)
Eosinophils Relative: 1 %
HEMATOCRIT: 41.4 % (ref 35.0–47.0)
HEMOGLOBIN: 13.7 g/dL (ref 12.0–16.0)
LYMPHS ABS: 9.1 10*3/uL — AB (ref 1.0–3.6)
LYMPHS PCT: 70 %
MCH: 29.7 pg (ref 26.0–34.0)
MCHC: 33.2 g/dL (ref 32.0–36.0)
MCV: 89.5 fL (ref 80.0–100.0)
MONOS PCT: 5 %
Monocytes Absolute: 0.7 10*3/uL (ref 0.2–0.9)
NEUTROS PCT: 23 %
Neutro Abs: 3 10*3/uL (ref 1.4–6.5)
Platelets: 205 10*3/uL (ref 150–440)
RBC: 4.62 MIL/uL (ref 3.80–5.20)
RDW: 13.4 % (ref 11.5–14.5)
WBC: 13 10*3/uL — ABNORMAL HIGH (ref 3.6–11.0)

## 2016-07-16 LAB — BASIC METABOLIC PANEL
Anion gap: 7 (ref 5–15)
BUN: 12 mg/dL (ref 6–20)
CHLORIDE: 103 mmol/L (ref 101–111)
CO2: 27 mmol/L (ref 22–32)
CREATININE: 0.84 mg/dL (ref 0.44–1.00)
Calcium: 8.9 mg/dL (ref 8.9–10.3)
GFR calc Af Amer: >60 mL/min (ref >60)
GFR calc non Af Amer: >60 mL/min (ref >60)
GLUCOSE: 132 mg/dL — AB (ref 65–99)
POTASSIUM: 3.9 mmol/L (ref 3.5–5.1)
SODIUM: 137 mmol/L (ref 135–145)

## 2016-07-16 LAB — LACTATE DEHYDROGENASE: LDH: 142 U/L (ref 98–192)

## 2016-07-16 NOTE — Progress Notes (Signed)
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   # [id number]- Hx of Right breast ca?; s/p Mastecomy bil [as per pt]; no anti-hormonal therapy/chemo/RT     Chronic lymphocytic leukemia ([place])   10/03/2015 Initial Diagnosis    Chronic lymphocytic leukemia (HCC)       HISTORY OF PRESENTING ILLNESS:  Melissa Curtis 75 y.o.  female  Above history of newly diagnosed CLL is here  For follow-up.   Patient was recently diagnosed with a "viral infection"- patient had cough treated with antibiotics. Patient also had diarrhea. Currently improved. As per the patient A chest x-ray showed "lung nodule"; however a repeat x-ray as per the patient was clear.    No fatigue.Patient denies any weight loss. Denies any night sweats. Denies any lumps or bumps. Denies any frequent infections or pneumonias.   ROS: A complete 10 point review of system is done which is negative except mentioned above in history of present illness  MEDICAL HISTORY:  Past Medical History:  Diagnosis Date  . Breast cancer ([place]) 1983   had double mastectomy  . Cataracts, bilateral   . CLL (chronic lymphocytic leukemia) ([place])   . Diabetes ([place])   . GERD (gastroesophageal reflux disease)   . Hypertension   . Leukocytosis 01/2014  . Osteopenia     SURGICAL HISTORY: Past Surgical History:  Procedure Laterality Date  . APPENDECTOMY    . COLONOSCOPY     by Dr. Vira Agar over 5 years ago  . double mastectomy with breast rescontruction  1983    SOCIAL HISTORY: lives in [place].  Social History   Social History  . Marital status: Married    Spouse name: N/A  . Number of children: N/A  . Years of education: N/A   Occupational History  . Not on file.   Social  History Main Topics  . Smoking status: Former Smoker    Quit date: 12/03/1995  . Smokeless tobacco: Never Used  . Alcohol use No  . Drug use: No  . Sexual activity: Yes    Partners: Male   Other Topics Concern  . Not on file   Social History Narrative  . No narrative on file    FAMILY HISTORY: Family History  Problem Relation Age of Onset  . Non-Hodgkin's lymphoma Mother 8  . Leukemia Father 44  . Colon cancer Paternal Grandmother     age unknown  . Breast cancer Cousin     paternal cancer    ALLERGIES:  is allergic to statins; iodine; shellfish allergy; and sulfa antibiotics.  MEDICATIONS:  Current Outpatient Prescriptions  Medication Sig Dispense Refill  . albuterol (PROVENTIL HFA;VENTOLIN HFA) 108 (90 Base) MCG/ACT inhaler Inhale into the lungs.    . ALPRAZolam (XANAX) 0.25 MG tablet Take 0.25 mg by mouth at bedtime as needed. anxiety    . aspirin EC 81 MG tablet Take 1 tablet by mouth daily.    Marland Kitchen azelastine (ASTELIN) 0.1 % nasal spray Place into the nose.    . Cyanocobalamin (RA VITAMIN B-12 TR) 1000 MCG TBCR Take 1,000 mcg by mouth at bedtime.    . dicyclomine (BENTYL) 10 MG capsule Take 10 mg by mouth daily.    . diphenhydrAMINE (BENADRYL) 25 mg  capsule Take 1 capsule by mouth every 6 (six) hours as needed. allergies    . fluticasone (FLONASE) 50 MCG/ACT nasal spray Place 1 spray into both nostrils daily.    Marland Kitchen glucose blood test strip     . lisinopril (PRINIVIL,ZESTRIL) 2.5 MG tablet Take 1 tablet by mouth daily.    . montelukast (SINGULAIR) 10 MG tablet Take by mouth.    . Multiple Vitamins-Minerals (MULTIVITAMIN GUMMIES ADULT PO) Take 1 each by mouth daily.    . naproxen sodium (RA NAPROXEN SODIUM) 220 MG tablet Take 2 tablets by mouth daily.    Marland Kitchen omeprazole (PRILOSEC) 20 MG capsule Take 1 capsule by mouth 2 (two) times daily.    . pravastatin (PRAVACHOL) 10 MG tablet Take 1 tablet by mouth at bedtime.    Marland Kitchen SPIRIVA HANDIHALER 18 MCG inhalation capsule Place 1  capsule into inhaler and inhale daily.    . traZODone (DESYREL) 50 MG tablet Take 1 tablet by mouth at bedtime.    . Calcium Carbonate-Vitamin D 600-400 MG-UNIT tablet Take 1 tablet by mouth 2 (two) times daily.     No current facility-administered medications for this visit.       Marland Kitchen  PHYSICAL EXAMINATION: ECOG PERFORMANCE STATUS: 0 - Asymptomatic  Vitals:   07/16/16 1541  BP: (!) 151/86  Pulse: 85  Resp: 18  Temp: 98.8 F (37.1 C)   Filed Weights   07/16/16 1541  Weight: 149 lb 11.1 oz (67.9 kg)    GENERAL: Well-nourished well-developed; Alert, no distress and comfortable. She is alone. EYES: no pallor or icterus OROPHARYNX: no thrush or ulceration; positive for dentures. NECK: supple, no masses felt LYMPH:  no palpable lymphadenopathy in the cervical, axillary or inguinal regions LUNGS: clear to auscultation and  No wheeze or crackles HEART/CVS: regular rate & rhythm and no murmurs; No lower extremity edema ABDOMEN: abdomen soft, non-tender and normal bowel sounds Musculoskeletal:no cyanosis of digits and no clubbing  PSYCH: alert & oriented x 3 with fluent speech NEURO: no focal motor/sensory deficits SKIN:  no rashes or significant lesions  LABORATORY DATA:  I have reviewed the data as listed Lab Results  Component Value Date   WBC 13.0 (H) 07/16/2016   HGB 13.7 07/16/2016   HCT 41.4 07/16/2016   MCV 89.5 07/16/2016   PLT 205 07/16/2016    Recent Labs  09/22/15 1314 02/13/16 0914 07/16/16 1555  NA 138 135 137  K 4.4 3.4* 3.9  CL 102 103 103  CO2 28 27 27   GLUCOSE 122* 192* 132*  BUN 13 8 12   CREATININE 0.81 0.85 0.84  CALCIUM 9.2 8.4* 8.9  GFRNONAA >60 >60 >60  GFRAA >60 >60 >60  ALKPHOS 72  --   --     ASSESSMENT & PLAN:   Chronic lymphocytic leukemia ([place]) # CLL [IgVH MUTATED/13q del-FISH]~12 absolute lymphocyte count with normal Hb; and platelet count. Patient is asymptomatic.  I would recommend continued surveillance at this time. Labs  today are pending.  # "Viral infection"- improving if continued infections recommend quantitative immunoglobulin checks.  # get labs today- [id number] [phone]/ home phone.   #  Patient follow-up with me in approximately 6 months with labs.  # 15 minutes face-to-face with the patient discussing the above plan of care; more than 50% of time spent on natural history; counseling and coordination.     Cammie Sickle, MD 07/16/2016 6:37 PM

## 2016-07-16 NOTE — Assessment & Plan Note (Addendum)
#   CLL [IgVH MUTATED/13q del-FISH]~12 absolute lymphocyte count with normal Hb; and platelet count. Patient is asymptomatic.  I would recommend continued surveillance at this time. Labs today are pending.  # "Viral infection"- improving if continued infections recommend quantitative immunoglobulin checks.  # get labs today- [id number] [phone]/ home phone.   #  Patient follow-up with me in approximately 6 months with labs.  # 15 minutes face-to-face with the patient discussing the above plan of care; more than 50% of time spent on natural history; counseling and coordination.

## 2016-07-17 ENCOUNTER — Telehealth: Payer: Self-pay

## 2016-07-17 NOTE — Telephone Encounter (Signed)
Called pt per MD labs have improved and to follow up as planned.  No other concerns noted.  Pt verbalized an understanding.

## 2016-09-06 ENCOUNTER — Telehealth: Payer: Self-pay | Admitting: Internal Medicine

## 2016-09-06 ENCOUNTER — Other Ambulatory Visit: Payer: Self-pay | Admitting: *Deleted

## 2016-09-06 DIAGNOSIS — C9110 Chronic lymphocytic leukemia of B-cell type not having achieved remission: Secondary | ICD-10-CM

## 2016-09-06 NOTE — Telephone Encounter (Signed)
Patient called [facility] and said her WBC is 33 according to Dr. Iona Beard and that they want her to see Dr. B no later than next week. She said she was hospitalized in [place] for four days with a UTI that went septic. Her WBC had dropped to 19 but are now rising again. I wanted to transfer her to triage nurse but she did not want to wait on hold so I am relaying message to you through inbasket. Can I please add her to your schedule one day next week? Please advise. Thank you.

## 2016-09-09 ENCOUNTER — Other Ambulatory Visit: Payer: Self-pay | Admitting: Internal Medicine

## 2016-09-09 DIAGNOSIS — C9110 Chronic lymphocytic leukemia of B-cell type not having achieved remission: Secondary | ICD-10-CM

## 2016-09-09 NOTE — Telephone Encounter (Signed)
RN spoke with md last Friday 10/6. MD Approved apt on Tuesday 10/10 with labs.

## 2016-09-10 ENCOUNTER — Inpatient Hospital Stay: Payer: Medicare Other

## 2016-09-10 ENCOUNTER — Inpatient Hospital Stay: Payer: Medicare Other | Attending: Internal Medicine | Admitting: Internal Medicine

## 2016-09-10 ENCOUNTER — Encounter: Payer: Self-pay | Admitting: Internal Medicine

## 2016-09-10 ENCOUNTER — Telehealth: Payer: Self-pay | Admitting: *Deleted

## 2016-09-10 VITALS — BP 127/82 | HR 84 | Temp 97.9°F | Resp 18 | Ht 63.0 in | Wt 149.3 lb

## 2016-09-10 DIAGNOSIS — Z800 Family history of malignant neoplasm of digestive organs: Secondary | ICD-10-CM | POA: Diagnosis not present

## 2016-09-10 DIAGNOSIS — K219 Gastro-esophageal reflux disease without esophagitis: Secondary | ICD-10-CM | POA: Insufficient documentation

## 2016-09-10 DIAGNOSIS — Z7982 Long term (current) use of aspirin: Secondary | ICD-10-CM | POA: Insufficient documentation

## 2016-09-10 DIAGNOSIS — Z853 Personal history of malignant neoplasm of breast: Secondary | ICD-10-CM | POA: Insufficient documentation

## 2016-09-10 DIAGNOSIS — C9110 Chronic lymphocytic leukemia of B-cell type not having achieved remission: Secondary | ICD-10-CM | POA: Diagnosis present

## 2016-09-10 DIAGNOSIS — R5383 Other fatigue: Secondary | ICD-10-CM | POA: Insufficient documentation

## 2016-09-10 DIAGNOSIS — Z9013 Acquired absence of bilateral breasts and nipples: Secondary | ICD-10-CM | POA: Insufficient documentation

## 2016-09-10 DIAGNOSIS — E119 Type 2 diabetes mellitus without complications: Secondary | ICD-10-CM | POA: Diagnosis not present

## 2016-09-10 DIAGNOSIS — I10 Essential (primary) hypertension: Secondary | ICD-10-CM | POA: Diagnosis not present

## 2016-09-10 DIAGNOSIS — D72829 Elevated white blood cell count, unspecified: Secondary | ICD-10-CM | POA: Insufficient documentation

## 2016-09-10 DIAGNOSIS — Z87440 Personal history of urinary (tract) infections: Secondary | ICD-10-CM | POA: Diagnosis not present

## 2016-09-10 DIAGNOSIS — Z79899 Other long term (current) drug therapy: Secondary | ICD-10-CM | POA: Insufficient documentation

## 2016-09-10 DIAGNOSIS — M8580 Other specified disorders of bone density and structure, unspecified site: Secondary | ICD-10-CM | POA: Diagnosis not present

## 2016-09-10 DIAGNOSIS — Z803 Family history of malignant neoplasm of breast: Secondary | ICD-10-CM | POA: Diagnosis not present

## 2016-09-10 DIAGNOSIS — Z87891 Personal history of nicotine dependence: Secondary | ICD-10-CM | POA: Diagnosis not present

## 2016-09-10 LAB — CBC WITH DIFFERENTIAL/PLATELET
BASOS ABS: 0.1 10*3/uL (ref 0–0.1)
Basophils Relative: 1 %
EOS ABS: 0.1 10*3/uL (ref 0–0.7)
EOS PCT: 1 %
HCT: 39.8 % (ref 35.0–47.0)
Hemoglobin: 13.4 g/dL (ref 12.0–16.0)
Lymphocytes Relative: 75 %
Lymphs Abs: 14.6 10*3/uL — ABNORMAL HIGH (ref 1.0–3.6)
MCH: 30 pg (ref 26.0–34.0)
MCHC: 33.6 g/dL (ref 32.0–36.0)
MCV: 89.4 fL (ref 80.0–100.0)
Monocytes Absolute: 0.7 10*3/uL (ref 0.2–0.9)
Monocytes Relative: 3 %
Neutro Abs: 4 10*3/uL (ref 1.4–6.5)
Neutrophils Relative %: 20 %
PLATELETS: 175 10*3/uL (ref 150–440)
RBC: 4.45 MIL/uL (ref 3.80–5.20)
RDW: 13.8 % (ref 11.5–14.5)
WBC: 19.5 10*3/uL — AB (ref 3.6–11.0)

## 2016-09-10 LAB — COMPREHENSIVE METABOLIC PANEL
ALT: 12 U/L — AB (ref 14–54)
AST: 23 U/L (ref 15–41)
Albumin: 3.6 g/dL (ref 3.5–5.0)
Alkaline Phosphatase: 52 U/L (ref 38–126)
Anion gap: 9 (ref 5–15)
BUN: 7 mg/dL (ref 6–20)
CHLORIDE: 100 mmol/L — AB (ref 101–111)
CO2: 27 mmol/L (ref 22–32)
CREATININE: 0.77 mg/dL (ref 0.44–1.00)
Calcium: 8.6 mg/dL — ABNORMAL LOW (ref 8.9–10.3)
GFR calc non Af Amer: >60 mL/min (ref >60)
GLUCOSE: 173 mg/dL — AB (ref 65–99)
Potassium: 3.1 mmol/L — ABNORMAL LOW (ref 3.5–5.1)
SODIUM: 136 mmol/L (ref 135–145)
Total Bilirubin: 0.4 mg/dL (ref 0.3–1.2)
Total Protein: 6.2 g/dL — ABNORMAL LOW (ref 6.5–8.1)

## 2016-09-10 LAB — LACTATE DEHYDROGENASE: LDH: 140 U/L (ref 98–192)

## 2016-09-10 NOTE — Progress Notes (Signed)
Pt reports being admitted [facility] in [place] for a UTI and being septic. Pt reports she started vomiting every 30 minutes and was sent to the hospital while being out of town.  No n/v at this current time.  MRI performed at [facility], had IV antibiotics.

## 2016-09-10 NOTE — Assessment & Plan Note (Signed)
#   CLL [IgVH MUTATED/13q del-FISH]~Repeat CBC- 19 today [improving from 40s] absolute lymphocyte count with normal Hb; and platelet count. Patient is asymptomatic.   # Recent UTI/ septic-leading to leucocytosis Recommend checking quantitative immunoglobulin. If low recommend IVIG q 4 weeks x4.   # get labs today- [phone]/ home phone- will call re: Infusion  #  Patient follow-up with me in approximately 6 months with labs.

## 2016-09-10 NOTE — Progress Notes (Signed)
[facility] NOTE  Patient Care Team: Sharyne Peach, MD as PCP - General (Family Medicine)  CHIEF COMPLAINTS/PURPOSE OF CONSULTATION:   Oncology History   # OCT 2016- CLL [peripheral blood flow] Rai Stage 0; CD-38-NEG; FISH- POS 13q del/ IgVH MUTATED [Good Prog] CT C/A/P- NO LN/spleen.   #   # [id number]- Hx of Right breast ca?; s/p Mastecomy bil [as per pt]; no anti-hormonal therapy/chemo/RT     Chronic lymphocytic leukemia ([place])   10/03/2015 Initial Diagnosis    Chronic lymphocytic leukemia ([place])        HISTORY OF PRESENTING ILLNESS:  Melissa Curtis 75 y.o.  female  above history of CLL- currently on surveillance was hospitalized for sepsis/UTI in [place] when she was traveling. She stated the hospital for 4 days; not in the ICU. Patient since returning back- continues to feel weak; otherwise denies any symptoms of burning urination. Denies any fevers. Denies any unusual cough or chest pain.    Patient denies any weight loss. Denies any night sweats. Denies any lumps or bumps. Otherwise she denies any frequent infections.  Patient had recent blood work with her PCP noted to have elevated white count of about 40,000- both with predominant neutrophilia and lymphocytosis.  ROS: A complete 10 point review of system is done which is negative except mentioned above in history of present illness  MEDICAL HISTORY:  Past Medical History:  Diagnosis Date  . Breast cancer ([place]) 1983   had double mastectomy  . Cataracts, bilateral   . CLL (chronic lymphocytic leukemia) ([place])   . Diabetes ([place])   . GERD (gastroesophageal reflux disease)   . Hypertension   . Leukocytosis 01/2014  . Osteopenia     SURGICAL HISTORY: Past Surgical History:  Procedure Laterality Date  . APPENDECTOMY    . COLONOSCOPY     by Dr. Vira Agar over 5 years ago  . double mastectomy with breast rescontruction  1983    SOCIAL HISTORY: lives in [place].  Social History   Social  History  . Marital status: Married    Spouse name: N/A  . Number of children: N/A  . Years of education: N/A   Occupational History  . Not on file.   Social History Main Topics  . Smoking status: Former Smoker    Quit date: 12/03/1995  . Smokeless tobacco: Never Used  . Alcohol use No  . Drug use: No  . Sexual activity: Yes    Partners: Male   Other Topics Concern  . Not on file   Social History Narrative  . No narrative on file    FAMILY HISTORY: Family History  Problem Relation Age of Onset  . Leukemia Father 60  . Non-Hodgkin's lymphoma Mother 41  . Colon cancer Paternal Grandmother     age unknown  . Breast cancer Cousin     paternal cancer    ALLERGIES:  is allergic to statins; iodine; shellfish allergy; and sulfa antibiotics.  MEDICATIONS:  Current Outpatient Prescriptions  Medication Sig Dispense Refill  . albuterol (PROVENTIL HFA;VENTOLIN HFA) 108 (90 Base) MCG/ACT inhaler Inhale into the lungs.    . ALPRAZolam (XANAX) 0.25 MG tablet Take 0.25 mg by mouth at bedtime as needed. anxiety    . aspirin EC 81 MG tablet Take 1 tablet by mouth daily.    Marland Kitchen azelastine (ASTELIN) 0.1 % nasal spray Place into the nose.    . Cyanocobalamin (RA VITAMIN B-12 TR) 1000 MCG TBCR Take 1,000 mcg by  mouth at bedtime.    . dicyclomine (BENTYL) 10 MG capsule Take 10 mg by mouth daily.    . diphenhydrAMINE (BENADRYL) 25 mg capsule Take 1 capsule by mouth every 6 (six) hours as needed. allergies    . fluticasone (FLONASE) 50 MCG/ACT nasal spray Place 1 spray into both nostrils daily.    Marland Kitchen glucose blood test strip     . lisinopril (PRINIVIL,ZESTRIL) 2.5 MG tablet Take 1 tablet by mouth daily.    . montelukast (SINGULAIR) 10 MG tablet Take by mouth.    . Multiple Vitamins-Minerals (MULTIVITAMIN GUMMIES ADULT PO) Take 1 each by mouth daily.    . naproxen sodium (RA NAPROXEN SODIUM) 220 MG tablet Take 2 tablets by mouth daily.    Marland Kitchen omeprazole (PRILOSEC) 20 MG capsule Take 1 capsule by  mouth 2 (two) times daily.    . pravastatin (PRAVACHOL) 10 MG tablet Take 1 tablet by mouth at bedtime.    Marland Kitchen SPIRIVA HANDIHALER 18 MCG inhalation capsule Place 1 capsule into inhaler and inhale daily.    . traZODone (DESYREL) 50 MG tablet Take 1 tablet by mouth at bedtime.    . Calcium Carbonate-Vitamin D 600-400 MG-UNIT tablet Take 1 tablet by mouth 2 (two) times daily.     No current facility-administered medications for this visit.       Marland Kitchen  PHYSICAL EXAMINATION: ECOG PERFORMANCE STATUS: 0 - Asymptomatic  Vitals:   09/10/16 1436  BP: 127/82  Pulse: 84  Resp: 18  Temp: 97.9 F (36.6 C)   Filed Weights   09/10/16 1436  Weight: 149 lb 4 oz (67.7 kg)    GENERAL: Well-nourished well-developed; Alert, no distress and comfortable. She is alone. EYES: no pallor or icterus OROPHARYNX: no thrush or ulceration; positive for dentures. NECK: supple, no masses felt LYMPH:  no palpable lymphadenopathy in the cervical, axillary or inguinal regions LUNGS: clear to auscultation and  No wheeze or crackles HEART/CVS: regular rate & rhythm and no murmurs; No lower extremity edema ABDOMEN: abdomen soft, non-tender and normal bowel sounds Musculoskeletal:no cyanosis of digits and no clubbing  PSYCH: alert & oriented x 3 with fluent speech NEURO: no focal motor/sensory deficits SKIN:  no rashes or significant lesions  LABORATORY DATA:  I have reviewed the data as listed Lab Results  Component Value Date   WBC 19.5 (H) 09/10/2016   HGB 13.4 09/10/2016   HCT 39.8 09/10/2016   MCV 89.4 09/10/2016   PLT 175 09/10/2016    Recent Labs  09/22/15 1314 02/13/16 0914 07/16/16 1555 09/10/16 1422  NA 138 135 137 136  K 4.4 3.4* 3.9 3.1*  CL 102 103 103 100*  CO2 28 27 27 27   GLUCOSE 122* 192* 132* 173*  BUN 13 8 12 7   CREATININE 0.81 0.85 0.84 0.77  CALCIUM 9.2 8.4* 8.9 8.6*  GFRNONAA >60 >60 >60 >60  GFRAA >60 >60 >60 >60  PROT  --   --   --  6.2*  ALBUMIN  --   --   --  3.6  AST   --   --   --  23  ALT  --   --   --  12*  ALKPHOS 72  --   --  52  BILITOT  --   --   --  0.4    ASSESSMENT & PLAN:   Chronic lymphocytic leukemia (HCC) # CLL [IgVH MUTATED/13q del-FISH]~Repeat CBC- 19 today [improving from 40s] absolute lymphocyte count with normal Hb; and platelet  count. Patient is asymptomatic.   # Recent UTI/ septic-leading to leucocytosis Recommend checking quantitative immunoglobulin. If low recommend IVIG q 4 weeks x4.   # get labs today- [phone]/ home phone- will call re: Infusion  #  Patient follow-up with me in approximately 6 months with labs.     Cammie Sickle, MD 09/10/2016 3:09 PM

## 2016-09-10 NOTE — Telephone Encounter (Signed)
Contacted patient. Left vm msg on home phone. Cell phone unable to leave vm. Not set up. Asked pt to contact [facility] at [facility] to discuss results on home vm

## 2016-09-11 LAB — IMMUNOGLOBULINS A/E/G/M, SERUM
IGA: 64 mg/dL (ref 64–422)
IGM, SERUM: 58 mg/dL (ref 26–217)
IgE (Immunoglobulin E), Serum: 3 IU/mL (ref 0–100)
IgG (Immunoglobin G), Serum: 557 mg/dL — ABNORMAL LOW (ref 700–1600)

## 2016-09-11 NOTE — Telephone Encounter (Signed)
Contacted patient regarding her lab results. Potassium 3.1. Encouraged patient to eat foods high in potassium. Teach back process performed with patient.

## 2016-09-12 ENCOUNTER — Telehealth: Payer: Self-pay | Admitting: *Deleted

## 2016-09-12 NOTE — Telephone Encounter (Signed)
Contacted patient. informed pt that she does not need any IVIG infusion as her antibodies are stable at this time. She gave verbal understanding and thanked me for calling her with this information.

## 2016-09-12 NOTE — Telephone Encounter (Signed)
-----   Message from Cammie Sickle, MD sent at 09/11/2016  4:35 PM EDT ----- Please inform pt that she does not need any IVIG infusion as her antibodies seem to be okay. Thx

## 2016-09-17 DIAGNOSIS — E876 Hypokalemia: Secondary | ICD-10-CM | POA: Insufficient documentation

## 2016-10-11 ENCOUNTER — Telehealth: Payer: Self-pay | Admitting: Internal Medicine

## 2016-10-11 NOTE — Telephone Encounter (Signed)
Called pt back and advised that Dr. Jacinto Reap does not feel it is necessary to change her schedule and that he will see her in April but to call if she has any additional concerns or symptoms. She said she will be monitoring WBC with Dr. Iona Beard and will contact us if needed.

## 2016-10-11 NOTE — Telephone Encounter (Signed)
Per Dr Rogue Bussing, no new recommendations, follow up as planned

## 2016-10-11 NOTE — Telephone Encounter (Signed)
Last week at Dr. Cleon Dew office her WBC count was 20 and Dr. Iona Beard said she needs to come back and see Dr. B again. I advised the patient that we would call her back after discussing with Dr. Jacinto Reap.

## 2017-01-21 ENCOUNTER — Other Ambulatory Visit: Payer: Medicare Other

## 2017-01-21 ENCOUNTER — Ambulatory Visit: Payer: Medicare Other | Admitting: Internal Medicine

## 2017-03-03 DIAGNOSIS — K219 Gastro-esophageal reflux disease without esophagitis: Secondary | ICD-10-CM | POA: Insufficient documentation

## 2017-03-11 ENCOUNTER — Other Ambulatory Visit: Payer: Medicare Other

## 2017-03-18 ENCOUNTER — Inpatient Hospital Stay: Payer: Medicare Other | Attending: Internal Medicine

## 2017-03-18 ENCOUNTER — Ambulatory Visit: Payer: Medicare Other | Admitting: Internal Medicine

## 2017-03-18 DIAGNOSIS — Z9049 Acquired absence of other specified parts of digestive tract: Secondary | ICD-10-CM | POA: Insufficient documentation

## 2017-03-18 DIAGNOSIS — Z87891 Personal history of nicotine dependence: Secondary | ICD-10-CM | POA: Diagnosis not present

## 2017-03-18 DIAGNOSIS — C9110 Chronic lymphocytic leukemia of B-cell type not having achieved remission: Secondary | ICD-10-CM | POA: Insufficient documentation

## 2017-03-18 DIAGNOSIS — E119 Type 2 diabetes mellitus without complications: Secondary | ICD-10-CM | POA: Diagnosis not present

## 2017-03-18 DIAGNOSIS — Z79899 Other long term (current) drug therapy: Secondary | ICD-10-CM | POA: Insufficient documentation

## 2017-03-18 DIAGNOSIS — Z807 Family history of other malignant neoplasms of lymphoid, hematopoietic and related tissues: Secondary | ICD-10-CM | POA: Diagnosis not present

## 2017-03-18 DIAGNOSIS — D72829 Elevated white blood cell count, unspecified: Secondary | ICD-10-CM | POA: Diagnosis not present

## 2017-03-18 DIAGNOSIS — I10 Essential (primary) hypertension: Secondary | ICD-10-CM | POA: Diagnosis not present

## 2017-03-18 DIAGNOSIS — K219 Gastro-esophageal reflux disease without esophagitis: Secondary | ICD-10-CM | POA: Diagnosis not present

## 2017-03-18 DIAGNOSIS — Z7982 Long term (current) use of aspirin: Secondary | ICD-10-CM | POA: Diagnosis not present

## 2017-03-18 DIAGNOSIS — Z800 Family history of malignant neoplasm of digestive organs: Secondary | ICD-10-CM | POA: Insufficient documentation

## 2017-03-18 DIAGNOSIS — Z803 Family history of malignant neoplasm of breast: Secondary | ICD-10-CM | POA: Diagnosis not present

## 2017-03-18 DIAGNOSIS — Z9013 Acquired absence of bilateral breasts and nipples: Secondary | ICD-10-CM | POA: Insufficient documentation

## 2017-03-18 DIAGNOSIS — M8580 Other specified disorders of bone density and structure, unspecified site: Secondary | ICD-10-CM | POA: Diagnosis not present

## 2017-03-18 LAB — COMPREHENSIVE METABOLIC PANEL
ALK PHOS: 70 U/L (ref 38–126)
ALT: 12 U/L — AB (ref 14–54)
AST: 20 U/L (ref 15–41)
Albumin: 4.3 g/dL (ref 3.5–5.0)
Anion gap: 8 (ref 5–15)
BUN: 10 mg/dL (ref 6–20)
CALCIUM: 8.9 mg/dL (ref 8.9–10.3)
CO2: 26 mmol/L (ref 22–32)
CREATININE: 0.91 mg/dL (ref 0.44–1.00)
Chloride: 101 mmol/L (ref 101–111)
GFR, EST NON AFRICAN AMERICAN: 59 mL/min — AB (ref >60)
Glucose, Bld: 188 mg/dL — ABNORMAL HIGH (ref 65–99)
Potassium: 3.9 mmol/L (ref 3.5–5.1)
Sodium: 135 mmol/L (ref 135–145)
TOTAL PROTEIN: 6.7 g/dL (ref 6.5–8.1)
Total Bilirubin: 0.9 mg/dL (ref 0.3–1.2)

## 2017-03-18 LAB — CBC WITH DIFFERENTIAL/PLATELET
Basophils Absolute: 0.1 10*3/uL (ref 0–0.1)
Basophils Relative: 0 %
Eosinophils Absolute: 0.1 10*3/uL (ref 0–0.7)
Eosinophils Relative: 1 %
HCT: 44 % (ref 35.0–47.0)
HEMOGLOBIN: 14.4 g/dL (ref 12.0–16.0)
LYMPHS ABS: 11.6 10*3/uL — AB (ref 1.0–3.6)
LYMPHS PCT: 75 %
MCH: 29.7 pg (ref 26.0–34.0)
MCHC: 32.9 g/dL (ref 32.0–36.0)
MCV: 90.3 fL (ref 80.0–100.0)
Monocytes Absolute: 0.5 10*3/uL (ref 0.2–0.9)
Monocytes Relative: 4 %
NEUTROS PCT: 20 %
Neutro Abs: 3 10*3/uL (ref 1.4–6.5)
Platelets: 204 10*3/uL (ref 150–440)
RBC: 4.87 MIL/uL (ref 3.80–5.20)
RDW: 13.6 % (ref 11.5–14.5)
WBC: 15.3 10*3/uL — AB (ref 3.6–11.0)

## 2017-03-18 LAB — LACTATE DEHYDROGENASE: LDH: 136 U/L (ref 98–192)

## 2017-03-23 LAB — IMMUNOGLOBULINS A/E/G/M, SERUM
IGA: 58 mg/dL — AB (ref 64–422)
IgE (Immunoglobulin E), Serum: 4 IU/mL (ref 0–100)
IgG (Immunoglobin G), Serum: 601 mg/dL — ABNORMAL LOW (ref 700–1600)
IgM, Serum: 61 mg/dL (ref 26–217)

## 2017-03-25 ENCOUNTER — Inpatient Hospital Stay (HOSPITAL_BASED_OUTPATIENT_CLINIC_OR_DEPARTMENT_OTHER): Payer: Medicare Other | Admitting: Internal Medicine

## 2017-03-25 VITALS — BP 111/72 | HR 73 | Temp 99.1°F | Resp 16 | Wt 151.2 lb

## 2017-03-25 DIAGNOSIS — Z79899 Other long term (current) drug therapy: Secondary | ICD-10-CM | POA: Diagnosis not present

## 2017-03-25 DIAGNOSIS — C9110 Chronic lymphocytic leukemia of B-cell type not having achieved remission: Secondary | ICD-10-CM

## 2017-03-25 DIAGNOSIS — Z9013 Acquired absence of bilateral breasts and nipples: Secondary | ICD-10-CM

## 2017-03-25 DIAGNOSIS — D72829 Elevated white blood cell count, unspecified: Secondary | ICD-10-CM | POA: Diagnosis not present

## 2017-03-25 DIAGNOSIS — Z800 Family history of malignant neoplasm of digestive organs: Secondary | ICD-10-CM

## 2017-03-25 DIAGNOSIS — Z807 Family history of other malignant neoplasms of lymphoid, hematopoietic and related tissues: Secondary | ICD-10-CM | POA: Diagnosis not present

## 2017-03-25 DIAGNOSIS — Z87891 Personal history of nicotine dependence: Secondary | ICD-10-CM

## 2017-03-25 DIAGNOSIS — I10 Essential (primary) hypertension: Secondary | ICD-10-CM

## 2017-03-25 DIAGNOSIS — E119 Type 2 diabetes mellitus without complications: Secondary | ICD-10-CM | POA: Diagnosis not present

## 2017-03-25 DIAGNOSIS — Z9049 Acquired absence of other specified parts of digestive tract: Secondary | ICD-10-CM | POA: Diagnosis not present

## 2017-03-25 DIAGNOSIS — K219 Gastro-esophageal reflux disease without esophagitis: Secondary | ICD-10-CM | POA: Diagnosis not present

## 2017-03-25 DIAGNOSIS — M8580 Other specified disorders of bone density and structure, unspecified site: Secondary | ICD-10-CM | POA: Diagnosis not present

## 2017-03-25 DIAGNOSIS — Z7982 Long term (current) use of aspirin: Secondary | ICD-10-CM

## 2017-03-25 DIAGNOSIS — Z803 Family history of malignant neoplasm of breast: Secondary | ICD-10-CM

## 2017-03-25 NOTE — Progress Notes (Signed)
[facility] NOTE  Patient Care Team: Sharyne Peach, MD as PCP - General (Family Medicine)  CHIEF COMPLAINTS/PURPOSE OF CONSULTATION:   Oncology History   # OCT 2016- CLL [peripheral blood flow] Rai Stage 0; CD-38-NEG; FISH- POS 13q del/ IgVH MUTATED [Good Prog] CT C/A/P- NO LN/spleen.   #   # [id number]- Hx of Right breast ca?; s/p Mastecomy bil [as per pt]; no anti-hormonal therapy/chemo/RT     Chronic lymphocytic leukemia ([place])   10/03/2015 Initial Diagnosis    Chronic lymphocytic leukemia (HCC)        HISTORY OF PRESENTING ILLNESS:  Melissa Curtis 76 y.o.  female  above history of CLL- currently on surveillance Is here for follow-up.  Patient denies any other significant infectious episodes. No bronchitis or pneumonias. No need for antibiotics recently. Patient denies any weight loss. Denies any night sweats. Denies any lumps or bumps.   ROS: A complete 10 point review of system is done which is negative except mentioned above in history of present illness  MEDICAL HISTORY:  Past Medical History:  Diagnosis Date  . Breast cancer ([place]) 1983   had double mastectomy  . Cataracts, bilateral   . CLL (chronic lymphocytic leukemia) ([place])   . Diabetes ([place])   . GERD (gastroesophageal reflux disease)   . Hypertension   . Leukocytosis 01/2014  . Osteopenia     SURGICAL HISTORY: Past Surgical History:  Procedure Laterality Date  . APPENDECTOMY    . COLONOSCOPY     by Dr. Vira Agar over 5 years ago  . double mastectomy with breast rescontruction  1983    SOCIAL HISTORY: lives in [place].  Social History   Social History  . Marital status: Married    Spouse name: N/A  . Number of children: N/A  . Years of education: N/A   Occupational History  . Not on file.   Social History Main Topics  . Smoking status: Former Smoker    Quit date: 12/03/1995  . Smokeless tobacco: Never Used  . Alcohol use No  . Drug use: No  . Sexual activity: Yes   Partners: Male   Other Topics Concern  . Not on file   Social History Narrative  . No narrative on file    FAMILY HISTORY: Family History  Problem Relation Age of Onset  . Leukemia Father 48  . Non-Hodgkin's lymphoma Mother 57  . Colon cancer Paternal Grandmother     age unknown  . Breast cancer Cousin     paternal cancer    ALLERGIES:  is allergic to statins; iodine; shellfish allergy; and sulfa antibiotics.  MEDICATIONS:  Current Outpatient Prescriptions  Medication Sig Dispense Refill  . albuterol (PROVENTIL HFA;VENTOLIN HFA) 108 (90 Base) MCG/ACT inhaler Inhale into the lungs.    . ALPRAZolam (XANAX) 0.25 MG tablet Take 0.25 mg by mouth at bedtime as needed. anxiety    . aspirin EC 81 MG tablet Take 1 tablet by mouth daily.    . Cyanocobalamin (RA VITAMIN B-12 TR) 1000 MCG TBCR Take 1,000 mcg by mouth at bedtime.    . dicyclomine (BENTYL) 10 MG capsule Take 10 mg by mouth daily.    . diphenhydrAMINE (BENADRYL) 25 mg capsule Take 1 capsule by mouth every 6 (six) hours as needed. allergies    . fexofenadine (ALLEGRA) 180 MG tablet Take 180 mg by mouth daily as needed.    . fluticasone (FLONASE) 50 MCG/ACT nasal spray Place 1 spray into both nostrils daily.    Marland Kitchen  glucose blood test strip     . ipratropium (ATROVENT) 0.06 % nasal spray Place into the nose.    Marland Kitchen lisinopril (PRINIVIL,ZESTRIL) 2.5 MG tablet Take 1 tablet by mouth daily.    . Multiple Vitamins-Minerals (MULTIVITAMIN GUMMIES ADULT PO) Take 1 each by mouth daily.    . naproxen sodium (RA NAPROXEN SODIUM) 220 MG tablet Take 2 tablets by mouth daily.    Marland Kitchen omeprazole (PRILOSEC) 20 MG capsule Take 1 capsule by mouth 2 (two) times daily.    . pravastatin (PRAVACHOL) 10 MG tablet Take 1 tablet by mouth at bedtime.    Marland Kitchen SPIRIVA HANDIHALER 18 MCG inhalation capsule Place 1 capsule into inhaler and inhale daily.    . traZODone (DESYREL) 50 MG tablet Take 1 tablet by mouth at bedtime.    Marland Kitchen azelastine (ASTELIN) 0.1 % nasal  spray Place into the nose.    . Calcium Carbonate-Vitamin D 600-400 MG-UNIT tablet Take 1 tablet by mouth 2 (two) times daily.    . montelukast (SINGULAIR) 10 MG tablet Take by mouth.     No current facility-administered medications for this visit.       Marland Kitchen  PHYSICAL EXAMINATION: ECOG PERFORMANCE STATUS: 0 - Asymptomatic  Vitals:   03/25/17 1118  BP: 111/72  Pulse: 73  Resp: 16  Temp: 99.1 F (37.3 C)   Filed Weights   03/25/17 1118  Weight: 151 lb 3.8 oz (68.6 kg)    GENERAL: Well-nourished well-developed; Alert, no distress and comfortable. She is alone. EYES: no pallor or icterus OROPHARYNX: no thrush or ulceration; positive for dentures. NECK: supple, no masses felt LYMPH:  no palpable lymphadenopathy axillary or inguinal regions. Mild palpable adenopathy bilateral posterior cervical chain. LUNGS: clear to auscultation and  No wheeze or crackles HEART/CVS: regular rate & rhythm and no murmurs; No lower extremity edema ABDOMEN: abdomen soft, non-tender and normal bowel sounds Musculoskeletal:no cyanosis of digits and no clubbing  PSYCH: alert & oriented x 3 with fluent speech NEURO: no focal motor/sensory deficits SKIN:  no rashes or significant lesions  LABORATORY DATA:  I have reviewed the data as listed Lab Results  Component Value Date   WBC 15.3 (H) 03/18/2017   HGB 14.4 03/18/2017   HCT 44.0 03/18/2017   MCV 90.3 03/18/2017   PLT 204 03/18/2017    Recent Labs  07/16/16 1555 09/10/16 1422 03/18/17 1030  NA 137 136 135  K 3.9 3.1* 3.9  CL 103 100* 101  CO2 27 27 26   GLUCOSE 132* 173* 188*  BUN 12 7 10   CREATININE 0.84 0.77 0.91  CALCIUM 8.9 8.6* 8.9  GFRNONAA >60 >60 59*  GFRAA >60 >60 >60  PROT  --  6.2* 6.7  ALBUMIN  --  3.6 4.3  AST  --  23 20  ALT  --  12* 12*  ALKPHOS  --  52 70  BILITOT  --  0.4 0.9    ASSESSMENT & PLAN:   Chronic lymphocytic leukemia (HCC) # CLL [IgVH MUTATED/13q del-FISH]~Repeat CBC- 15.3 today [improving from  40s] absolute lymphocyte count-10; with normal Hb; and platelet count. Patient is asymptomatic. Continue surveillance. Immunoglobulin levels normal.  #  Patient follow-up with me in approximately 6 months with labs.     Cammie Sickle, MD 03/25/2017 11:26 AM

## 2017-03-25 NOTE — Assessment & Plan Note (Addendum)
#   CLL [IgVH MUTATED/13q del-FISH]~Repeat CBC- 15.3 today [improving from 40s] absolute lymphocyte count-10; with normal Hb; and platelet count. Patient is asymptomatic. Continue surveillance. Immunoglobulin levels normal.  #  Patient follow-up with me in approximately 6 months with labs.

## 2017-03-25 NOTE — Progress Notes (Signed)
Patient here today for follow up.  Patient states no new concerns today  

## 2017-06-05 DIAGNOSIS — Z23 Encounter for immunization: Secondary | ICD-10-CM | POA: Insufficient documentation

## 2017-09-23 ENCOUNTER — Inpatient Hospital Stay: Payer: Medicare Other

## 2017-09-23 ENCOUNTER — Inpatient Hospital Stay: Payer: Medicare Other | Attending: Internal Medicine | Admitting: Internal Medicine

## 2017-09-23 DIAGNOSIS — Z800 Family history of malignant neoplasm of digestive organs: Secondary | ICD-10-CM | POA: Diagnosis not present

## 2017-09-23 DIAGNOSIS — C9190 Lymphoid leukemia, unspecified not having achieved remission: Secondary | ICD-10-CM

## 2017-09-23 DIAGNOSIS — E119 Type 2 diabetes mellitus without complications: Secondary | ICD-10-CM | POA: Diagnosis not present

## 2017-09-23 DIAGNOSIS — C9110 Chronic lymphocytic leukemia of B-cell type not having achieved remission: Secondary | ICD-10-CM

## 2017-09-23 DIAGNOSIS — Z853 Personal history of malignant neoplasm of breast: Secondary | ICD-10-CM | POA: Insufficient documentation

## 2017-09-23 DIAGNOSIS — Z807 Family history of other malignant neoplasms of lymphoid, hematopoietic and related tissues: Secondary | ICD-10-CM

## 2017-09-23 DIAGNOSIS — Z79899 Other long term (current) drug therapy: Secondary | ICD-10-CM | POA: Insufficient documentation

## 2017-09-23 DIAGNOSIS — Z806 Family history of leukemia: Secondary | ICD-10-CM

## 2017-09-23 DIAGNOSIS — M8580 Other specified disorders of bone density and structure, unspecified site: Secondary | ICD-10-CM | POA: Diagnosis not present

## 2017-09-23 DIAGNOSIS — Z803 Family history of malignant neoplasm of breast: Secondary | ICD-10-CM | POA: Insufficient documentation

## 2017-09-23 DIAGNOSIS — K219 Gastro-esophageal reflux disease without esophagitis: Secondary | ICD-10-CM | POA: Insufficient documentation

## 2017-09-23 DIAGNOSIS — Z9011 Acquired absence of right breast and nipple: Secondary | ICD-10-CM | POA: Insufficient documentation

## 2017-09-23 DIAGNOSIS — Z87891 Personal history of nicotine dependence: Secondary | ICD-10-CM

## 2017-09-23 DIAGNOSIS — I10 Essential (primary) hypertension: Secondary | ICD-10-CM | POA: Insufficient documentation

## 2017-09-23 DIAGNOSIS — Z7982 Long term (current) use of aspirin: Secondary | ICD-10-CM | POA: Insufficient documentation

## 2017-09-23 LAB — COMPREHENSIVE METABOLIC PANEL
ALBUMIN: 4 g/dL (ref 3.5–5.0)
ALK PHOS: 64 U/L (ref 38–126)
ALT: 11 U/L — ABNORMAL LOW (ref 14–54)
ANION GAP: 6 (ref 5–15)
AST: 20 U/L (ref 15–41)
BILIRUBIN TOTAL: 0.7 mg/dL (ref 0.3–1.2)
BUN: 12 mg/dL (ref 6–20)
CO2: 28 mmol/L (ref 22–32)
Calcium: 8.8 mg/dL — ABNORMAL LOW (ref 8.9–10.3)
Chloride: 103 mmol/L (ref 101–111)
Creatinine, Ser: 0.91 mg/dL (ref 0.44–1.00)
GFR calc Af Amer: >60 mL/min (ref >60)
GFR calc non Af Amer: 59 mL/min — ABNORMAL LOW (ref >60)
GLUCOSE: 186 mg/dL — AB (ref 65–99)
POTASSIUM: 3.9 mmol/L (ref 3.5–5.1)
SODIUM: 137 mmol/L (ref 135–145)
TOTAL PROTEIN: 6.4 g/dL — AB (ref 6.5–8.1)

## 2017-09-23 LAB — CBC WITH DIFFERENTIAL/PLATELET
BASOS ABS: 0.1 10*3/uL (ref 0–0.1)
BASOS PCT: 1 %
EOS ABS: 0.1 10*3/uL (ref 0–0.7)
EOS PCT: 1 %
HEMATOCRIT: 40.3 % (ref 35.0–47.0)
Hemoglobin: 13.5 g/dL (ref 12.0–16.0)
Lymphocytes Relative: 77 %
Lymphs Abs: 11.4 10*3/uL — ABNORMAL HIGH (ref 1.0–3.6)
MCH: 30.7 pg (ref 26.0–34.0)
MCHC: 33.5 g/dL (ref 32.0–36.0)
MCV: 91.5 fL (ref 80.0–100.0)
MONO ABS: 0.4 10*3/uL (ref 0.2–0.9)
MONOS PCT: 3 %
Neutro Abs: 2.7 10*3/uL (ref 1.4–6.5)
Neutrophils Relative %: 18 %
PLATELETS: 161 10*3/uL (ref 150–440)
RBC: 4.4 MIL/uL (ref 3.80–5.20)
RDW: 13.8 % (ref 11.5–14.5)
WBC: 14.7 10*3/uL — ABNORMAL HIGH (ref 3.6–11.0)

## 2017-09-23 LAB — LACTATE DEHYDROGENASE: LDH: 131 U/L (ref 98–192)

## 2017-09-23 NOTE — Progress Notes (Signed)
[facility] NOTE  Patient Care Team: Sharyne Peach, MD as PCP - General (Family Medicine)  CHIEF COMPLAINTS/PURPOSE OF CONSULTATION:   Oncology History   # OCT 2016- CLL [peripheral blood flow] Rai Stage 0; CD-38-NEG; FISH- POS 13q del/ IgVH MUTATED [Good Prog] CT C/A/P- NO LN/spleen.    # 1983- Hx of Right breast ca?; s/p Mastecomy bil [as per pt]; no anti-hormonal therapy/chemo/RT     Chronic lymphocytic leukemia (HCC)     HISTORY OF PRESENTING ILLNESS:  Melissa Curtis 76 y.o.  female  above history of CLL- currently on surveillance Is here for follow-up.  Patient denies any weight loss or night sweats. No pain. No new lumps or bumps. Patient denies any other significant infectious episodes. No bronchitis or pneumonias. No need for antibiotics recently.   ROS: A complete 10 point review of system is done which is negative except mentioned above in history of present illness  MEDICAL HISTORY:  Past Medical History:  Diagnosis Date  . Breast cancer ([place]) 1983   had double mastectomy  . Cataracts, bilateral   . CLL (chronic lymphocytic leukemia) ([place])   . Diabetes ([place])   . GERD (gastroesophageal reflux disease)   . Hypertension   . Leukocytosis 01/2014  . Osteopenia     SURGICAL HISTORY: Past Surgical History:  Procedure Laterality Date  . APPENDECTOMY    . COLONOSCOPY     by Dr. Vira Agar over 5 years ago  . double mastectomy with breast rescontruction  1983    SOCIAL HISTORY: lives in [place].  Social History   Social History  . Marital status: Married    Spouse name: N/A  . Number of children: N/A  . Years of education: N/A   Occupational History  . Not on file.   Social History Main Topics  . Smoking status: Former Smoker    Quit date: 12/03/1995  . Smokeless tobacco: Never Used  . Alcohol use No  . Drug use: No  . Sexual activity: Yes    Partners: Male   Other Topics Concern  . Not on file   Social History Narrative   . No narrative on file    FAMILY HISTORY: Family History  Problem Relation Age of Onset  . Leukemia Father 21  . Non-Hodgkin's lymphoma Mother 72  . Colon cancer Paternal Grandmother        age unknown  . Breast cancer Cousin        paternal cancer    ALLERGIES:  is allergic to statins; iodine; shellfish allergy; and sulfa antibiotics.  MEDICATIONS:  Current Outpatient Prescriptions  Medication Sig Dispense Refill  . ALPRAZolam (XANAX) 0.25 MG tablet Take 0.25 mg by mouth at bedtime as needed. anxiety    . aspirin EC 81 MG tablet Take 1 tablet by mouth daily.    . Cyanocobalamin (RA VITAMIN B-12 TR) 1000 MCG TBCR Take 1,000 mcg by mouth at bedtime.    . dicyclomine (BENTYL) 10 MG capsule Take 10 mg by mouth daily.    . diphenhydrAMINE (BENADRYL) 25 mg capsule Take 1 capsule by mouth every 6 (six) hours as needed. allergies    . fexofenadine (ALLEGRA) 180 MG tablet Take 180 mg by mouth daily as needed.    . fluticasone (FLONASE) 50 MCG/ACT nasal spray Place 1 spray into both nostrils daily.    Marland Kitchen glucose blood test strip     . ipratropium (ATROVENT) 0.06 % nasal spray Place into the nose.    Marland Kitchen  lisinopril (PRINIVIL,ZESTRIL) 2.5 MG tablet Take 1 tablet by mouth daily.    . Multiple Vitamins-Minerals (MULTIVITAMIN GUMMIES ADULT PO) Take 1 each by mouth daily.    . naproxen sodium (RA NAPROXEN SODIUM) 220 MG tablet Take 2 tablets by mouth daily.    Marland Kitchen omeprazole (PRILOSEC) 20 MG capsule Take 1 capsule by mouth 2 (two) times daily.    . pravastatin (PRAVACHOL) 10 MG tablet Take 1 tablet by mouth at bedtime.    Marland Kitchen SPIRIVA HANDIHALER 18 MCG inhalation capsule Place 1 capsule into inhaler and inhale daily.    . traZODone (DESYREL) 50 MG tablet Take 1 tablet by mouth at bedtime.    . Calcium Carbonate-Vitamin D 600-400 MG-UNIT tablet Take 1 tablet by mouth 2 (two) times daily.     No current facility-administered medications for this visit.       Marland Kitchen  PHYSICAL EXAMINATION: ECOG  PERFORMANCE STATUS: 0 - Asymptomatic  Vitals:   09/23/17 1115  BP: 125/76  Pulse: 71  Resp: 16  Temp: 98.7 F (37.1 C)   Filed Weights   09/23/17 1054 09/23/17 1115  Weight: 153 lb 12.3 oz (69.7 kg) 153 lb 12.3 oz (69.7 kg)    GENERAL: Well-nourished well-developed; Alert, no distress and comfortable. She is alone. EYES: no pallor or icterus OROPHARYNX: no thrush or ulceration; positive for dentures. NECK: supple, no masses felt LYMPH:  no palpable lymphadenopathy axillary or inguinal regions. No lymphadenopathy noted. HEART/CVS: regular rate & rhythm and no murmurs; No lower extremity edema ABDOMEN: abdomen soft, non-tender and normal bowel sounds Musculoskeletal:no cyanosis of digits and no clubbing  PSYCH: alert & oriented x 3 with fluent speech NEURO: no focal motor/sensory deficits SKIN:  no rashes or significant lesions  LABORATORY DATA:  I have reviewed the data as listed Lab Results  Component Value Date   WBC 14.7 (H) 09/23/2017   HGB 13.5 09/23/2017   HCT 40.3 09/23/2017   MCV 91.5 09/23/2017   PLT 161 09/23/2017    Recent Labs  03/18/17 1030  NA 135  K 3.9  CL 101  CO2 26  GLUCOSE 188*  BUN 10  CREATININE 0.91  CALCIUM 8.9  GFRNONAA 59*  GFRAA >60  PROT 6.7  ALBUMIN 4.3  AST 20  ALT 12*  ALKPHOS 70  BILITOT 0.9    ASSESSMENT & PLAN:   Chronic lymphocytic leukemia (HCC) # CLL [IgVH MUTATED/13q del-FISH]~Repeat CBC- 14.7 today [improving from 40s] absolute lymphocyte count-10; with normal Hb; and platelet count. Patient is asymptomatic. Continue surveillance. Discussed over all good prognosis.   #  Patient follow-up with me in approximately 6 months with labs.     Cammie Sickle, MD 09/23/2017 11:32 AM

## 2017-09-23 NOTE — Assessment & Plan Note (Signed)
#   CLL [IgVH MUTATED/13q del-FISH]~Repeat CBC- 14.7 today [improving from 40s] absolute lymphocyte count-10; with normal Hb; and platelet count. Patient is asymptomatic. Continue surveillance. Discussed over all good prognosis.   #  Patient follow-up with me in approximately 6 months with labs.

## 2017-09-25 DIAGNOSIS — R42 Dizziness and giddiness: Secondary | ICD-10-CM | POA: Insufficient documentation

## 2018-03-17 ENCOUNTER — Ambulatory Visit
Admission: RE | Admit: 2018-03-17 | Discharge: 2018-03-17 | Disposition: A | Discharge status: Home / Self Care | Payer: Medicare Other | Source: Ambulatory Visit | Attending: Medical Oncology | Admitting: Medical Oncology

## 2018-03-17 ENCOUNTER — Other Ambulatory Visit: Payer: Self-pay | Admitting: Medical Oncology

## 2018-03-17 DIAGNOSIS — I700 Atherosclerosis of aorta: Secondary | ICD-10-CM | POA: Diagnosis not present

## 2018-03-17 DIAGNOSIS — J9801 Acute bronchospasm: Secondary | ICD-10-CM

## 2018-03-23 DIAGNOSIS — I700 Atherosclerosis of aorta: Secondary | ICD-10-CM | POA: Insufficient documentation

## 2018-03-24 ENCOUNTER — Inpatient Hospital Stay: Payer: Medicare Other | Attending: Internal Medicine | Admitting: Internal Medicine

## 2018-03-24 ENCOUNTER — Other Ambulatory Visit: Payer: Self-pay

## 2018-03-24 ENCOUNTER — Encounter: Payer: Self-pay | Admitting: Internal Medicine

## 2018-03-24 ENCOUNTER — Inpatient Hospital Stay: Payer: Medicare Other

## 2018-03-24 VITALS — BP 132/54 | HR 69 | Temp 99.0°F | Resp 20 | Ht 63.0 in | Wt 154.0 lb

## 2018-03-24 DIAGNOSIS — C9110 Chronic lymphocytic leukemia of B-cell type not having achieved remission: Secondary | ICD-10-CM | POA: Insufficient documentation

## 2018-03-24 LAB — COMPREHENSIVE METABOLIC PANEL
ALBUMIN: 3.8 g/dL (ref 3.5–5.0)
ALT: 10 U/L — AB (ref 14–54)
AST: 21 U/L (ref 15–41)
Alkaline Phosphatase: 59 U/L (ref 38–126)
Anion gap: 8 (ref 5–15)
BUN: 9 mg/dL (ref 6–20)
CHLORIDE: 105 mmol/L (ref 101–111)
CO2: 25 mmol/L (ref 22–32)
CREATININE: 0.82 mg/dL (ref 0.44–1.00)
Calcium: 8.6 mg/dL — ABNORMAL LOW (ref 8.9–10.3)
GFR calc Af Amer: >60 mL/min (ref >60)
GLUCOSE: 207 mg/dL — AB (ref 65–99)
Potassium: 3.8 mmol/L (ref 3.5–5.1)
Sodium: 138 mmol/L (ref 135–145)
Total Bilirubin: 0.4 mg/dL (ref 0.3–1.2)
Total Protein: 6.5 g/dL (ref 6.5–8.1)

## 2018-03-24 LAB — CBC WITH DIFFERENTIAL/PLATELET
BASOS ABS: 0.1 10*3/uL (ref 0–0.1)
Basophils Relative: 1 %
EOS PCT: 1 %
Eosinophils Absolute: 0.1 10*3/uL (ref 0–0.7)
HCT: 40.6 % (ref 35.0–47.0)
Hemoglobin: 13.5 g/dL (ref 12.0–16.0)
Lymphocytes Relative: 71 %
Lymphs Abs: 10.7 10*3/uL — ABNORMAL HIGH (ref 1.0–3.6)
MCH: 30.4 pg (ref 26.0–34.0)
MCHC: 33.3 g/dL (ref 32.0–36.0)
MCV: 91.3 fL (ref 80.0–100.0)
Monocytes Absolute: 0.4 10*3/uL (ref 0.2–0.9)
Monocytes Relative: 3 %
NEUTROS ABS: 3.6 10*3/uL (ref 1.4–6.5)
Neutrophils Relative %: 24 %
PLATELETS: 185 10*3/uL (ref 150–440)
RBC: 4.45 MIL/uL (ref 3.80–5.20)
RDW: 13.6 % (ref 11.5–14.5)
WBC: 15 10*3/uL — AB (ref 3.6–11.0)

## 2018-03-24 LAB — LACTATE DEHYDROGENASE: LDH: 136 U/L (ref 98–192)

## 2018-03-24 NOTE — Progress Notes (Signed)
[facility] NOTE  Patient Care Team: Sharyne Peach, MD as PCP - General (Family Medicine)  CHIEF COMPLAINTS/PURPOSE OF CONSULTATION:   Oncology History   # OCT 2016- CLL [peripheral blood flow] Rai Stage 0; CD-38-NEG; FISH- POS 13q del/ IgVH MUTATED [Good Prog] CT C/A/P- NO LN/spleen.    # 1983- Hx of Right breast ca?; s/p Mastecomy bil [as per pt]; no anti-hormonal therapy/chemo/RT     Chronic lymphocytic leukemia (HCC)     HISTORY OF PRESENTING ILLNESS:  Melissa Curtis 77 y.o.  female  above history of CLL- currently on surveillance Is here for follow-up.  Patient denies any weight loss.  Denies any night sweats.  Denies any lumps or bumps.  Appetite is good.  No recent pneumonias.  No hospitalizations.  ROS: A complete 10 point review of system is done which is negative except mentioned above in history of present illness  MEDICAL HISTORY:  Past Medical History:  Diagnosis Date  . Breast cancer ([facility]) 1983   had double mastectomy  . Cataracts, bilateral   . CLL (chronic lymphocytic leukemia) ([place])   . Diabetes ([place])   . GERD (gastroesophageal reflux disease)   . Hypertension   . Leukocytosis 01/2014  . Osteopenia     SURGICAL HISTORY: Past Surgical History:  Procedure Laterality Date  . APPENDECTOMY    . COLONOSCOPY     by Dr. Vira Agar over 5 years ago  . double mastectomy with breast rescontruction  1983    SOCIAL HISTORY: lives in [place].  Social History   Socioeconomic History  . Marital status: Married    Spouse name: Not on file  . Number of children: Not on file  . Years of education: Not on file  . Highest education level: Not on file  Occupational History  . Not on file  Social Needs  . Financial resource strain: Not on file  . Food insecurity:    Worry: Not on file    Inability: Not on file  . Transportation needs:    Medical: Not on file    Non-medical: Not on file  Tobacco Use  . Smoking status: Former Smoker     Last attempt to quit: 12/03/1995    Years since quitting: 22.3  . Smokeless tobacco: Never Used  Substance and Sexual Activity  . Alcohol use: No    Alcohol/week: 0.0 oz  . Drug use: No  . Sexual activity: Yes    Partners: Male  Lifestyle  . Physical activity:    Days per week: Not on file    Minutes per session: Not on file  . Stress: Not on file  Relationships  . Social connections:    Talks on phone: Not on file    Gets together: Not on file    Attends religious service: Not on file    Active member of club or organization: Not on file    Attends meetings of clubs or organizations: Not on file    Relationship status: Not on file  . Intimate partner violence:    Fear of current or ex partner: Not on file    Emotionally abused: Not on file    Physically abused: Not on file    Forced sexual activity: Not on file  Other Topics Concern  . Not on file  Social History Narrative  . Not on file    FAMILY HISTORY: Family History  Problem Relation Age of Onset  . Leukemia Father 67  .  Non-Hodgkin's lymphoma Mother 5  . Colon cancer Paternal Grandmother        age unknown  . Breast cancer Cousin        paternal cancer    ALLERGIES:  is allergic to statins; iodine; shellfish allergy; and sulfa antibiotics.  MEDICATIONS:  Current Outpatient Medications  Medication Sig Dispense Refill  . aspirin EC 81 MG tablet Take 1 tablet by mouth daily.    . Calcium Carbonate-Vitamin D 600-400 MG-UNIT tablet Take 1 tablet by mouth 2 (two) times daily.    . Cyanocobalamin (RA VITAMIN B-12 TR) 1000 MCG TBCR Take 1,000 mcg by mouth at bedtime.    . dicyclomine (BENTYL) 10 MG capsule Take 10 mg by mouth daily.    . diphenhydrAMINE (BENADRYL) 25 mg capsule Take 1 capsule by mouth every 6 (six) hours as needed. allergies    . fexofenadine (ALLEGRA) 180 MG tablet Take 180 mg by mouth daily as needed.    . fluticasone (FLONASE) 50 MCG/ACT nasal spray Place 1 spray into both nostrils daily.     Marland Kitchen glucose blood test strip     . lisinopril (PRINIVIL,ZESTRIL) 2.5 MG tablet Take 1 tablet by mouth daily.    . Multiple Vitamins-Minerals (MULTIVITAMIN GUMMIES ADULT PO) Take 1 each by mouth daily.    . naproxen sodium (RA NAPROXEN SODIUM) 220 MG tablet Take 2 tablets by mouth daily.    Marland Kitchen omeprazole (PRILOSEC) 20 MG capsule Take 1 capsule by mouth 2 (two) times daily.    . pravastatin (PRAVACHOL) 10 MG tablet Take 1 tablet by mouth at bedtime.    . traZODone (DESYREL) 50 MG tablet Take 1 tablet by mouth at bedtime.     No current facility-administered medications for this visit.       Marland Kitchen  PHYSICAL EXAMINATION: ECOG PERFORMANCE STATUS: 0 - Asymptomatic  Vitals:   03/24/18 1012  BP: (!) 132/54  Pulse: 69  Resp: 20  Temp: 99 F (37.2 C)   Filed Weights   03/24/18 1012  Weight: 154 lb (69.9 kg)    GENERAL: Well-nourished well-developed; Alert, no distress and comfortable. She is alone. EYES: no pallor or icterus OROPHARYNX: no thrush or ulceration; positive for dentures. NECK: supple, no masses felt LYMPH:  no palpable lymphadenopathy axillary or inguinal regions. Mild palpable adenopathy bilateral posterior cervical chain. LUNGS: clear to auscultation and  No wheeze or crackles HEART/CVS: regular rate & rhythm and no murmurs; No lower extremity edema ABDOMEN: abdomen soft, non-tender and normal bowel sounds Musculoskeletal:no cyanosis of digits and no clubbing  PSYCH: alert & oriented x 3 with fluent speech NEURO: no focal motor/sensory deficits SKIN:  no rashes or significant lesions  LABORATORY DATA:  I have reviewed the data as listed Lab Results  Component Value Date   WBC 15.0 (H) 03/24/2018   HGB 13.5 03/24/2018   HCT 40.6 03/24/2018   MCV 91.3 03/24/2018   PLT 185 03/24/2018   Recent Labs    09/23/17 1108 03/24/18 1001  NA 137 138  K 3.9 3.8  CL 103 105  CO2 28 25  GLUCOSE 186* 207*  BUN 12 9  CREATININE 0.91 0.82  CALCIUM 8.8* 8.6*  GFRNONAA 59*  >60  GFRAA >60 >60  PROT 6.4* 6.5  ALBUMIN 4.0 3.8  AST 20 21  ALT 11* 10*  ALKPHOS 64 59  BILITOT 0.7 0.4    ASSESSMENT & PLAN:   Chronic lymphocytic leukemia ([facility]) # CLL [IgVH MUTATED/13q del-FISH]~Repeat CBC- 15.3 today [improving from  40s] absolute lymphocyte count-10; with normal Hb; and platelet count.   # Patient is asymptomatic. Continue surveillance.  Discussed over all good prognosis/and unlikely need for any treatment anytime soon.   #  Patient follow-up with me in approximately 6 months with labs.  She was given a copy of her labs from today.      Cammie Sickle, MD 03/24/2018 10:44 AM

## 2018-03-24 NOTE — Assessment & Plan Note (Addendum)
#   CLL [IgVH MUTATED/13q del-FISH]~Repeat CBC- 15.3 today [improving from 40s] absolute lymphocyte count-10; with normal Hb; and platelet count.   # Patient is asymptomatic. Continue surveillance.  Discussed over all good prognosis/and unlikely need for any treatment anytime soon.   #  Patient follow-up with me in approximately 6 months with labs.  She was given a copy of her labs from today.

## 2018-03-24 NOTE — Addendum Note (Signed)
Addended by: Sandria Bales B on: 03/24/2018 10:49 AM   Modules accepted: Orders

## 2018-03-25 ENCOUNTER — Other Ambulatory Visit: Payer: Self-pay | Admitting: Family Medicine

## 2018-04-17 ENCOUNTER — Other Ambulatory Visit: Payer: Self-pay | Admitting: Family Medicine

## 2018-04-17 DIAGNOSIS — C50911 Malignant neoplasm of unspecified site of right female breast: Secondary | ICD-10-CM

## 2018-05-01 ENCOUNTER — Ambulatory Visit
Admission: RE | Admit: 2018-05-01 | Discharge: 2018-05-01 | Disposition: A | Discharge status: Home / Self Care | Payer: Medicare Other | Source: Ambulatory Visit | Attending: Family Medicine | Admitting: Family Medicine

## 2018-05-01 DIAGNOSIS — C50911 Malignant neoplasm of unspecified site of right female breast: Secondary | ICD-10-CM

## 2018-09-05 IMAGING — US US BREAST*L* LIMITED INC AXILLA
1 series · 4 of 4 positions shown · non-contrast
Comparison: 08/06/2016 ([facility]OS[facility]] Breast Imaging),
06/02/2014 ([HOSPITAL]) and earlier.

ACR Breast Density Category a: The breast tissue is almost entirely
fatty.

CLINICAL DATA: 77-year-old who underwent a malignant RIGHT
mastectomy and a prophylactic subtotal LEFT mastectomy in 5418 with
BILATERAL implant reconstruction, presenting with a possible
palpable lump in the LEFT breast which the patient describes as a
possibly representing the implant.

EXAM:
DIGITAL DIAGNOSTIC BILATERAL MAMMOGRAM WITH IMPLANTS, CAD AND TOMO
ULTRASOUND LEFT BREAST

[Series 1: us breast*left* limited inc axilla · 0.06mm/px · 4 of 4 slices shown]
[im 1/4]
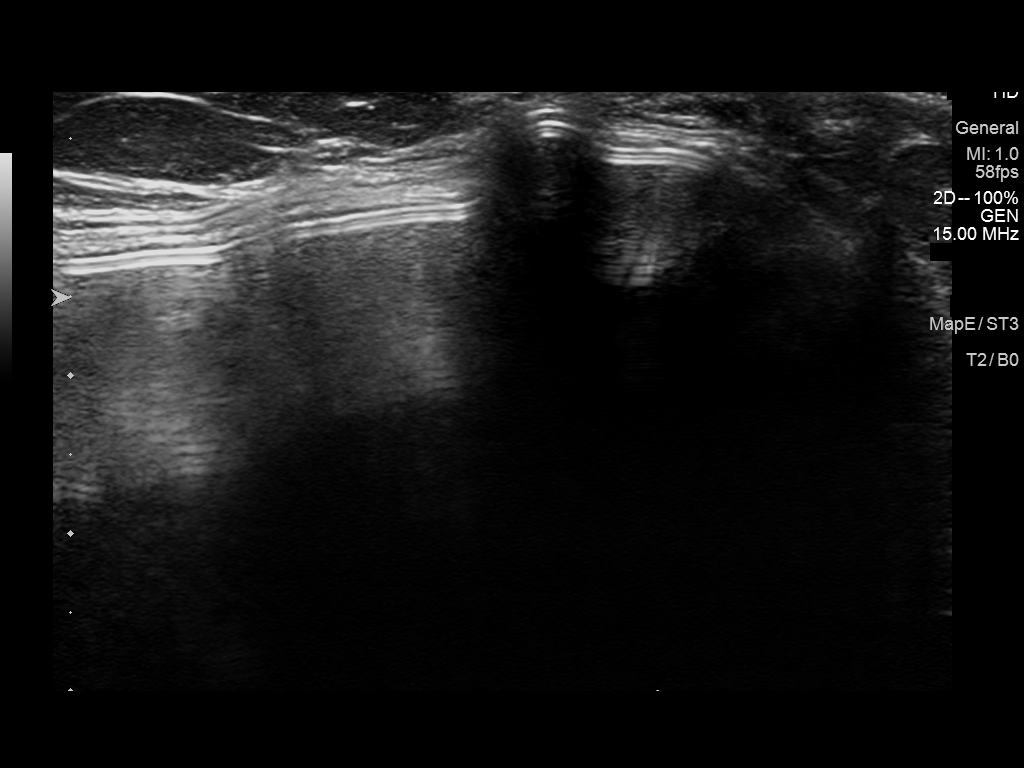
[im 2/4]
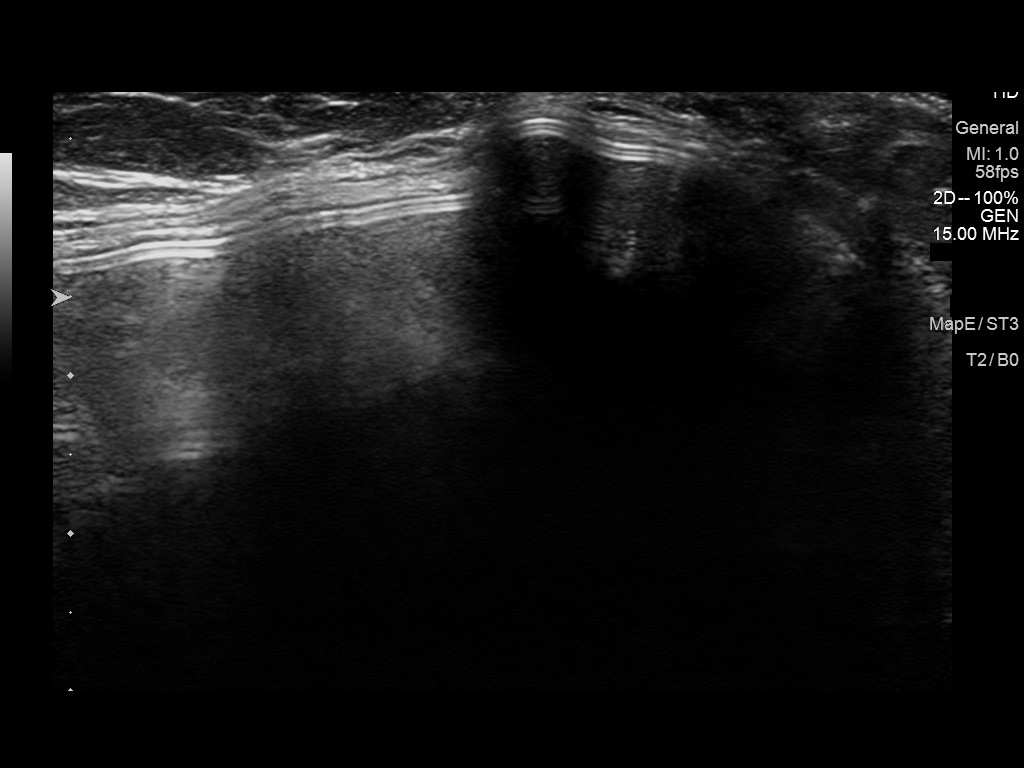
[im 3/4]
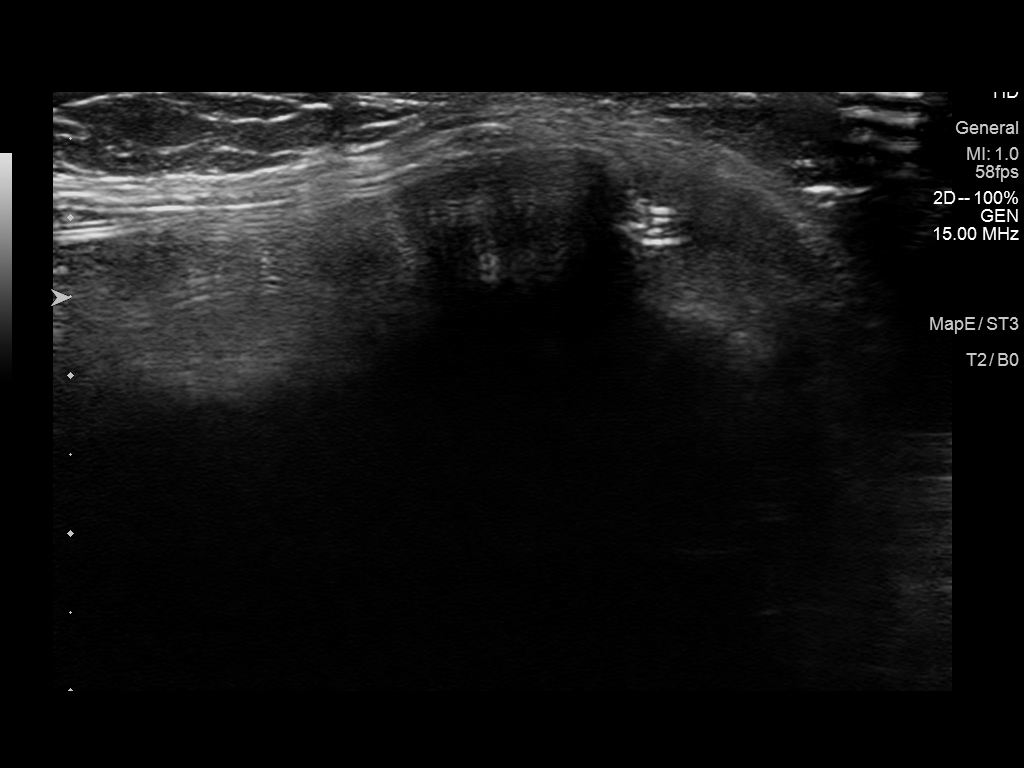
[im 4/4]
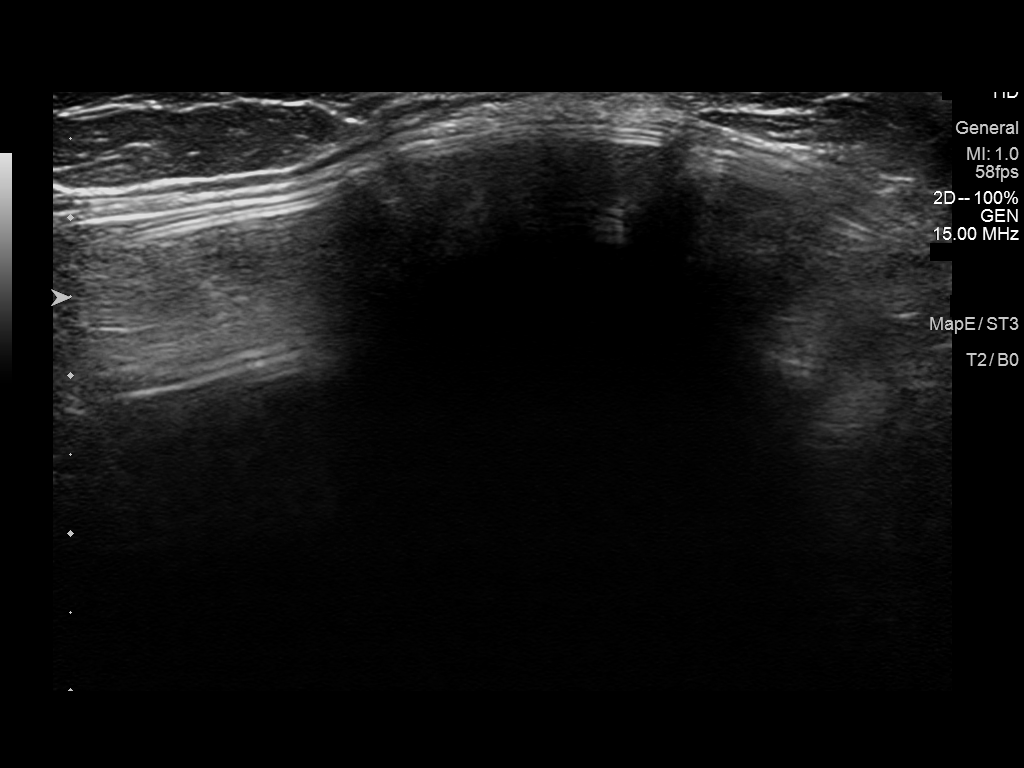

[4 of 4 positions shown; findings below may reference images not displayed]

FINDINGS: The patient has retropectoral saline implants. Standard 2D full
field CC and MLO views of both breasts and tomosynthesis and
synthesized implant displaced CC and MLO views of of the LEFT breast
were obtained. A standard and tomosynthesis spot compression
tangential view of the palpable concern in the LEFT breast was also
obtained.

No mammographic abnormality in the area of palpable concern in the
OUTER periareolar LEFT breast. Of note, on the full field images,
the implant contour has an abrupt angulation underlying the palpable
concern. The implant is intact in its visualized portion.

No findings suspicious for malignancy in either breast.

Mammographic images were processed with CAD.

On physical exam, the implant can be palpated directly beneath the
skin surface in the subareolar location of the LEFT breast with the
patient supine. There is no palpable firm fixed mass.

Targeted LEFT breast ultrasound is performed, showing that the
palpable concern in the subareolar location corresponds to the
implant capsule which is directly beneath the skin surface. The
sonographic finding corresponds to the abrupt angulation of the
capsule on the mammogram. No cyst, solid mass or abnormal acoustic
shadowing is identified.
IMPRESSION: 1. No mammographic or sonographic evidence of malignancy involving
the LEFT breast.
2. No mammographic evidence of malignancy involving the
reconstructed RIGHT breast.
3. The palpable concern in the subareolar LEFT breast corresponds to
the breast implant.

RECOMMENDATION:
Screening LEFT mammogram in 1 year.

I have discussed the findings and recommendations with the patient.
Results were also provided in writing at the conclusion of the
visit. If applicable, a reminder letter will be sent to the patient
regarding the next appointment.

BI-RADS CATEGORY  1: Negative.

## 2018-09-05 IMAGING — MG MM  DIGITAL DIAGNOSTIC BREAST BILAT IMPLANT W/ TOMO W/ CAD
8 of 12 series · 8 of 24 positions shown · non-contrast
Comparison: 08/06/2016 ([facility]] Breast Imaging),
06/02/2014 ([HOSPITAL]) and earlier.

ACR Breast Density Category a: The breast tissue is almost entirely
fatty.

CLINICAL DATA: 77-year-old who underwent a malignant RIGHT
mastectomy and a prophylactic subtotal LEFT mastectomy in 5418 with
BILATERAL implant reconstruction, presenting with a possible
palpable lump in the LEFT breast which the patient describes as a
possibly representing the implant.

EXAM:
DIGITAL DIAGNOSTIC BILATERAL MAMMOGRAM WITH IMPLANTS, CAD AND TOMO
ULTRASOUND LEFT BREAST

[L MLO (1 of 3)]
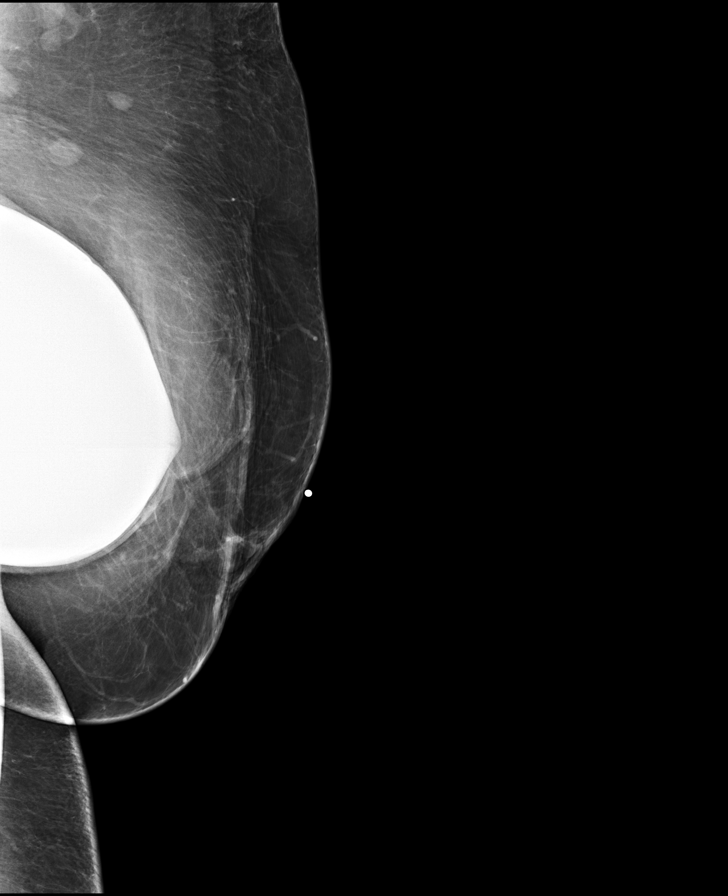

[L CC]
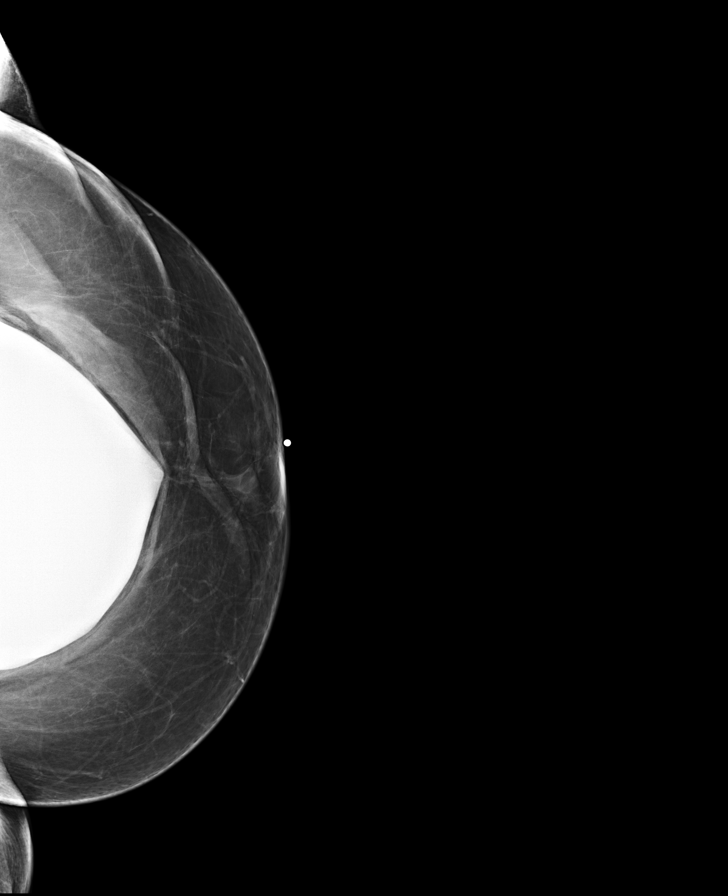

[R MLO]
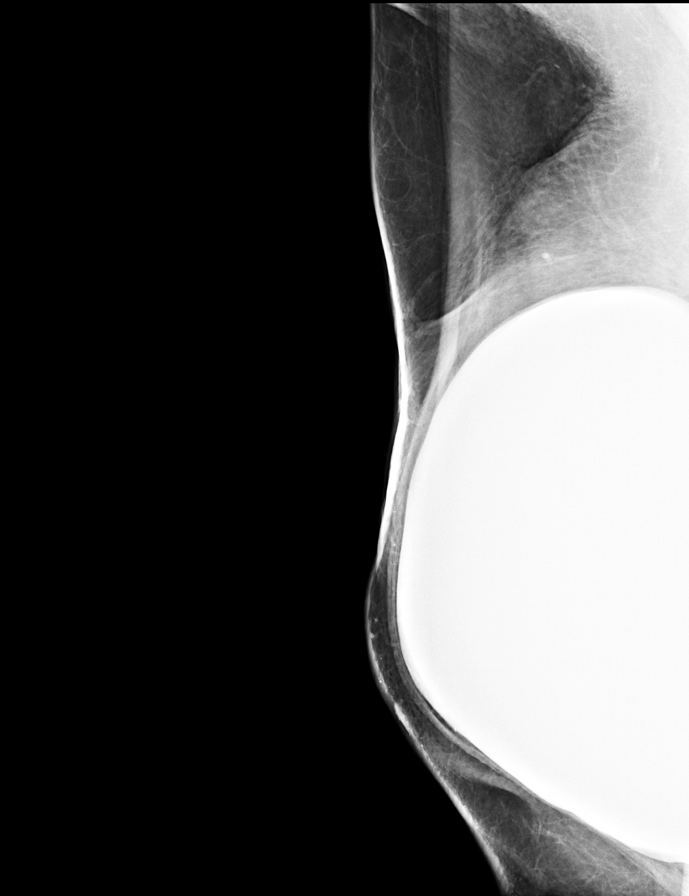

[L MLO (2 of 3)]
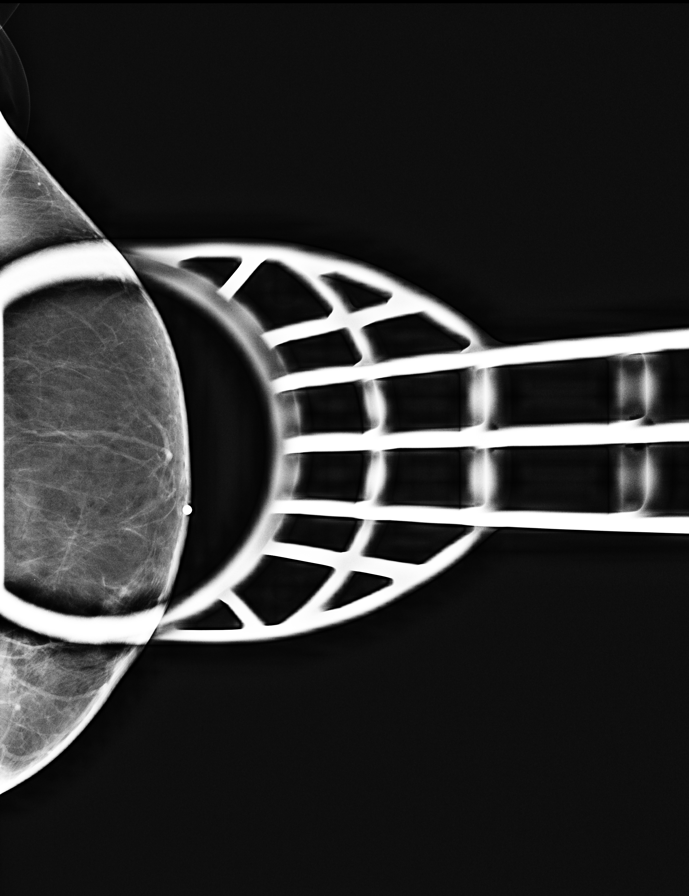

[L MLO (3 of 3)]
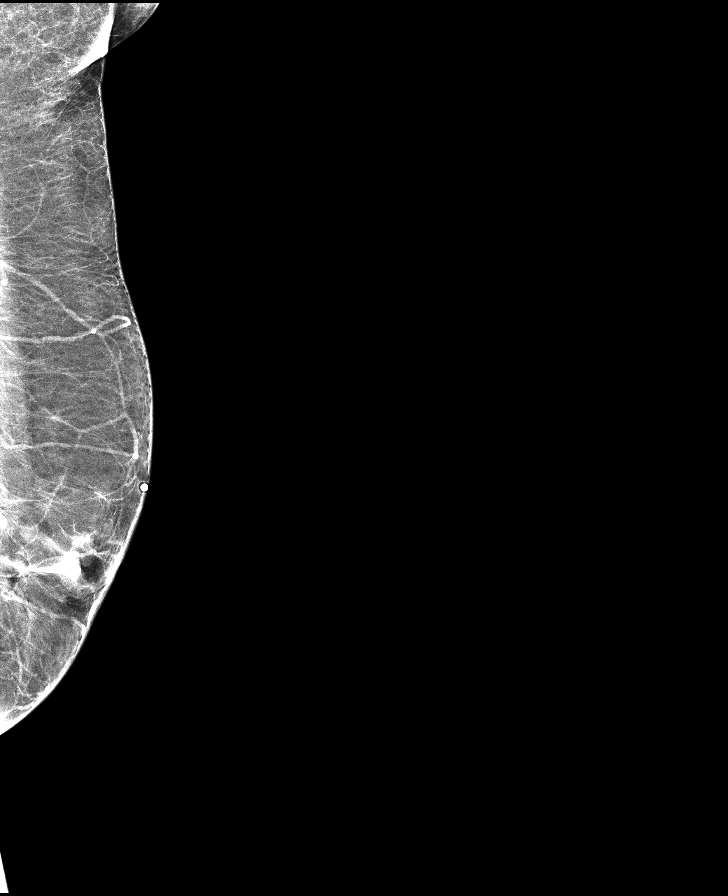

[L MLO synth-2D (1 of 2)]
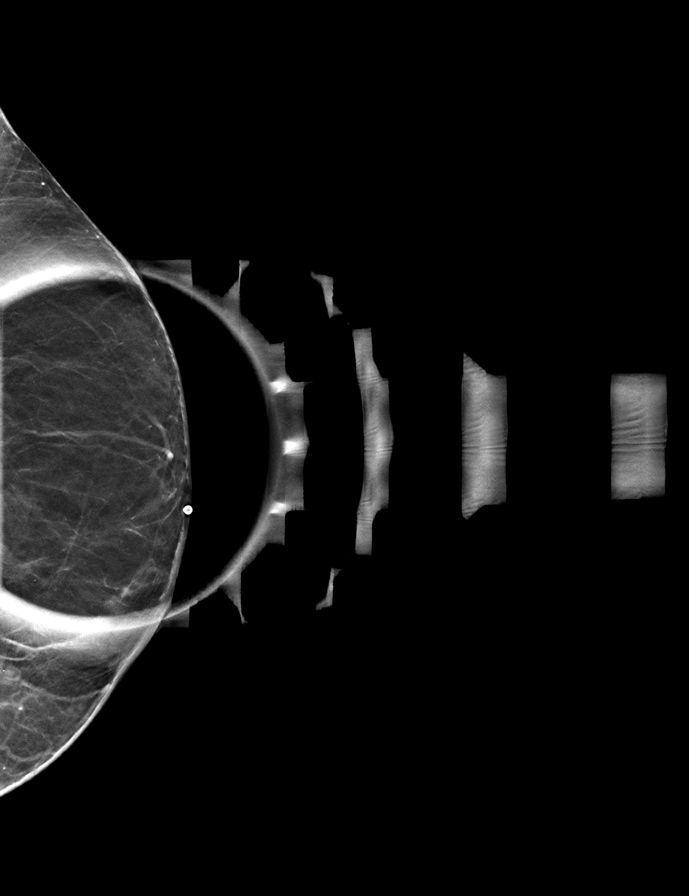

[L CC synth-2D]
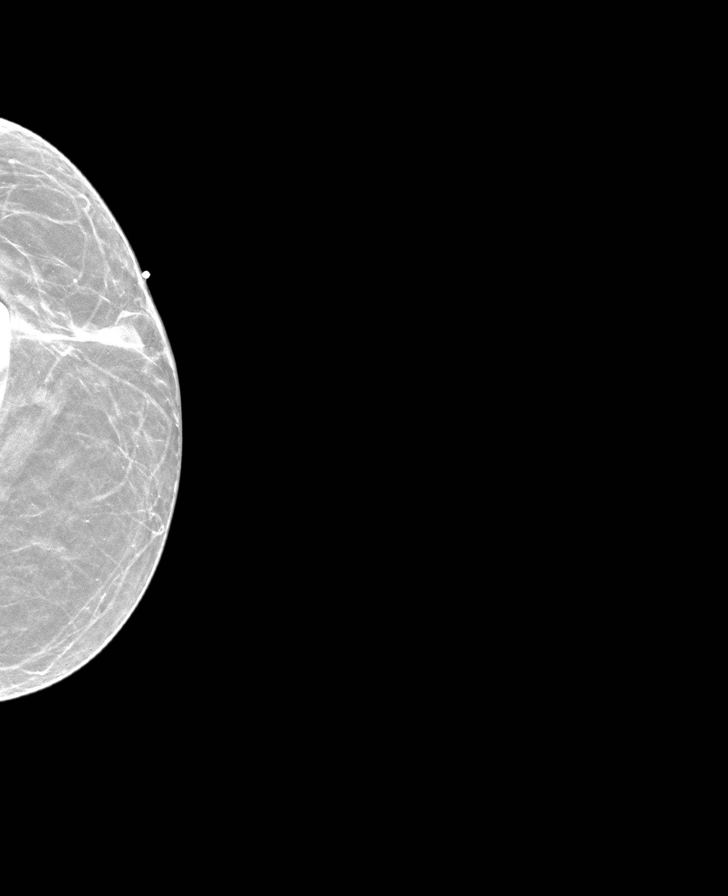

[L MLO synth-2D (2 of 2)]
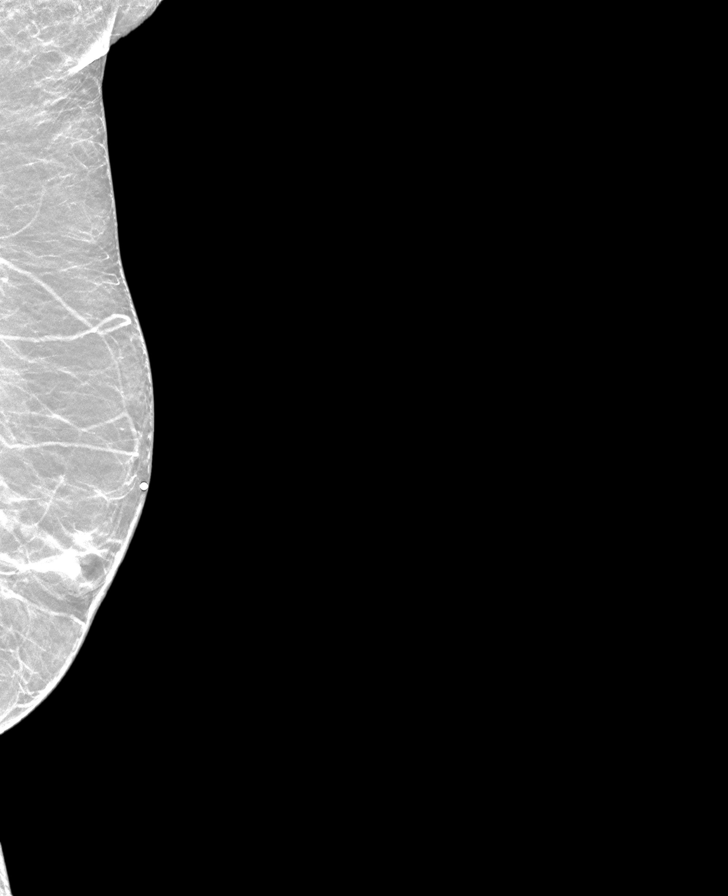

[8 of 24 positions shown; findings below may reference images not displayed]

FINDINGS: The patient has retropectoral saline implants. Standard 2D full
field CC and MLO views of both breasts and tomosynthesis and
synthesized implant displaced CC and MLO views of of the LEFT breast
were obtained. A standard and tomosynthesis spot compression
tangential view of the palpable concern in the LEFT breast was also
obtained.

No mammographic abnormality in the area of palpable concern in the
OUTER periareolar LEFT breast. Of note, on the full field images,
the implant contour has an abrupt angulation underlying the palpable
concern. The implant is intact in its visualized portion.

No findings suspicious for malignancy in either breast.

Mammographic images were processed with CAD.

On physical exam, the implant can be palpated directly beneath the
skin surface in the subareolar location of the LEFT breast with the
patient supine. There is no palpable firm fixed mass.

Targeted LEFT breast ultrasound is performed, showing that the
palpable concern in the subareolar location corresponds to the
implant capsule which is directly beneath the skin surface. The
sonographic finding corresponds to the abrupt angulation of the
capsule on the mammogram. No cyst, solid mass or abnormal acoustic
shadowing is identified.
IMPRESSION: 1. No mammographic or sonographic evidence of malignancy involving
the LEFT breast.
2. No mammographic evidence of malignancy involving the
reconstructed RIGHT breast.
3. The palpable concern in the subareolar LEFT breast corresponds to
the breast implant.

RECOMMENDATION:
Screening LEFT mammogram in 1 year.

I have discussed the findings and recommendations with the patient.
Results were also provided in writing at the conclusion of the
visit. If applicable, a reminder letter will be sent to the patient
regarding the next appointment.

BI-RADS CATEGORY  1: Negative.

## 2018-09-22 ENCOUNTER — Inpatient Hospital Stay: Payer: Medicare Other

## 2018-09-22 ENCOUNTER — Encounter: Payer: Self-pay | Admitting: Internal Medicine

## 2018-09-22 ENCOUNTER — Inpatient Hospital Stay: Payer: Medicare Other | Attending: Internal Medicine | Admitting: Internal Medicine

## 2018-09-22 VITALS — BP 144/80 | HR 71 | Temp 97.7°F | Resp 18 | Wt 154.3 lb

## 2018-09-22 DIAGNOSIS — Z79899 Other long term (current) drug therapy: Secondary | ICD-10-CM | POA: Insufficient documentation

## 2018-09-22 DIAGNOSIS — Z87891 Personal history of nicotine dependence: Secondary | ICD-10-CM | POA: Diagnosis not present

## 2018-09-22 DIAGNOSIS — C9110 Chronic lymphocytic leukemia of B-cell type not having achieved remission: Secondary | ICD-10-CM | POA: Insufficient documentation

## 2018-09-22 DIAGNOSIS — R0981 Nasal congestion: Secondary | ICD-10-CM | POA: Diagnosis not present

## 2018-09-22 LAB — CBC WITH DIFFERENTIAL/PLATELET
ABS IMMATURE GRANULOCYTES: 0.02 10*3/uL (ref 0.00–0.07)
Basophils Absolute: 0.1 10*3/uL (ref 0.0–0.1)
Basophils Relative: 0 %
Eosinophils Absolute: 0.1 10*3/uL (ref 0.0–0.5)
Eosinophils Relative: 1 %
HEMATOCRIT: 40.8 % (ref 36.0–46.0)
HEMOGLOBIN: 13.5 g/dL (ref 12.0–15.0)
IMMATURE GRANULOCYTES: 0 %
LYMPHS PCT: 83 %
Lymphs Abs: 17.9 10*3/uL — ABNORMAL HIGH (ref 0.7–4.0)
MCH: 31.7 pg (ref 26.0–34.0)
MCHC: 33.1 g/dL (ref 30.0–36.0)
MCV: 95.8 fL (ref 80.0–100.0)
MONO ABS: 0.5 10*3/uL (ref 0.1–1.0)
MONOS PCT: 2 %
NEUTROS ABS: 3 10*3/uL (ref 1.7–7.7)
Neutrophils Relative %: 14 %
PLATELETS: 194 10*3/uL (ref 150–400)
RBC: 4.26 MIL/uL (ref 3.87–5.11)
RDW: 13.2 % (ref 11.5–15.5)
WBC: 21.5 10*3/uL — AB (ref 4.0–10.5)
nRBC: 0 % (ref 0.0–0.2)

## 2018-09-22 LAB — COMPREHENSIVE METABOLIC PANEL
ALBUMIN: 4.2 g/dL (ref 3.5–5.0)
ALT: 14 U/L (ref 0–44)
AST: 20 U/L (ref 15–41)
Alkaline Phosphatase: 61 U/L (ref 38–126)
Anion gap: 9 (ref 5–15)
BILIRUBIN TOTAL: 0.8 mg/dL (ref 0.3–1.2)
BUN: 9 mg/dL (ref 8–23)
CHLORIDE: 101 mmol/L (ref 98–111)
CO2: 29 mmol/L (ref 22–32)
CREATININE: 0.82 mg/dL (ref 0.44–1.00)
Calcium: 8.8 mg/dL — ABNORMAL LOW (ref 8.9–10.3)
GFR calc Af Amer: >60 mL/min (ref >60)
GLUCOSE: 184 mg/dL — AB (ref 70–99)
POTASSIUM: 4 mmol/L (ref 3.5–5.1)
Sodium: 139 mmol/L (ref 135–145)
Total Protein: 6.9 g/dL (ref 6.5–8.1)

## 2018-09-22 LAB — LACTATE DEHYDROGENASE: LDH: 147 U/L (ref 98–192)

## 2018-09-22 NOTE — Progress Notes (Signed)
Patient reports cold over the past 2 weeks, was on zpak 1 week ago but did not seem to help.

## 2018-09-22 NOTE — Progress Notes (Signed)
[facility] NOTE  Patient Care Team: Sharyne Peach, MD as PCP - General (Family Medicine)  CHIEF COMPLAINTS/PURPOSE OF CONSULTATION:   Oncology History   # OCT 2016- CLL [peripheral blood flow] Rai Stage 0; CD-38-NEG; FISH- POS 13q del/ IgVH MUTATED [Good Prog] CT C/A/P- NO LN/spleen.    # 1983- Hx of Right breast ca?; s/p Mastecomy bil [as per pt]; no anti-hormonal therapy/chemo/RT     Chronic lymphocytic leukemia (HCC)     HISTORY OF PRESENTING ILLNESS:  Melissa Curtis 77 y.o.  female  above history of CLL- currently on surveillance Is here for follow-up.  Patient states that she had been visiting her home town in [place] few weeks ago.  Patient had episode of bronchitis/sinusitis-patient states she was started on Z-Pak.  Also received " steroid" injection.  Nasal congestion sinus congestion is improved.  Not resolved.  No fevers or chills.  No weight loss.  No lumps or bumps.  Review of Systems  Constitutional: Negative for chills, diaphoresis, fever, malaise/fatigue and weight loss.  HENT: Negative for nosebleeds and sore throat.   Eyes: Negative for double vision.  Respiratory: Negative for cough, hemoptysis, sputum production, shortness of breath and wheezing.   Cardiovascular: Negative for chest pain, palpitations, orthopnea and leg swelling.  Gastrointestinal: Negative for abdominal pain, blood in stool, constipation, diarrhea, heartburn, melena, nausea and vomiting.  Genitourinary: Negative for dysuria, frequency and urgency.  Musculoskeletal: Negative for back pain and joint pain.  Skin: Negative.  Negative for itching and rash.  Neurological: Negative for dizziness, tingling, focal weakness, weakness and headaches.  Endo/Heme/Allergies: Does not bruise/bleed easily.  Psychiatric/Behavioral: Negative for depression. The patient is not nervous/anxious and does not have insomnia.      MEDICAL HISTORY:  Past Medical History:  Diagnosis  Date  . Breast cancer ([place]) 1983   had double mastectomy  . Cataracts, bilateral   . CLL (chronic lymphocytic leukemia) ([place])   . Diabetes ([place])   . GERD (gastroesophageal reflux disease)   . Hypertension   . Leukocytosis 01/2014  . Osteopenia     SURGICAL HISTORY: Past Surgical History:  Procedure Laterality Date  . APPENDECTOMY    . AUGMENTATION MAMMAPLASTY Bilateral 1983  . COLONOSCOPY     by Dr. Vira Agar over 5 years ago  . double mastectomy with breast rescontruction  1983  . MASTECTOMY Bilateral 1983   right breast ca mastecomy only no chemo no rad bilat implants     SOCIAL HISTORY: lives in [place].  Social History   Socioeconomic History  . Marital status: Married    Spouse name: Not on file  . Number of children: Not on file  . Years of education: Not on file  . Highest education level: Not on file  Occupational History  . Not on file  Social Needs  . Financial resource strain: Not on file  . Food insecurity:    Worry: Not on file    Inability: Not on file  . Transportation needs:    Medical: Not on file    Non-medical: Not on file  Tobacco Use  . Smoking status: Former Smoker    Last attempt to quit: 12/03/1995    Years since quitting: 22.8  . Smokeless tobacco: Never Used  Substance and Sexual Activity  . Alcohol use: No    Alcohol/week: 0.0 standard drinks  . Drug use: No  . Sexual activity: Yes    Partners: Male  Lifestyle  . Physical activity:  Days per week: Not on file    Minutes per session: Not on file  . Stress: Not on file  Relationships  . Social connections:    Talks on phone: Not on file    Gets together: Not on file    Attends religious service: Not on file    Active member of club or organization: Not on file    Attends meetings of clubs or organizations: Not on file    Relationship status: Not on file  . Intimate partner violence:    Fear of current or ex partner: Not on file    Emotionally abused: Not on file    Physically  abused: Not on file    Forced sexual activity: Not on file  Other Topics Concern  . Not on file  Social History Narrative  . Not on file    FAMILY HISTORY: Family History  Problem Relation Age of Onset  . Leukemia Father 12  . Non-Hodgkin's lymphoma Mother 42  . Colon cancer Paternal Grandmother        age unknown  . Breast cancer Cousin        paternal cancer    ALLERGIES:  is allergic to statins; iodine; shellfish allergy; and sulfa antibiotics.  MEDICATIONS:  Current Outpatient Medications  Medication Sig Dispense Refill  . aspirin EC 81 MG tablet Take 1 tablet by mouth daily.    . Calcium Carbonate-Vitamin D 600-400 MG-UNIT tablet Take 1 tablet by mouth 2 (two) times daily.    Marland Kitchen dicyclomine (BENTYL) 10 MG capsule Take 10 mg by mouth daily.    . diphenhydrAMINE (BENADRYL) 25 mg capsule Take 1 capsule by mouth every 6 (six) hours as needed. allergies    . fexofenadine (ALLEGRA) 180 MG tablet Take 180 mg by mouth daily as needed.    . fluticasone (FLONASE) 50 MCG/ACT nasal spray Place 1 spray into both nostrils daily.    Marland Kitchen glucose blood test strip     . lisinopril (PRINIVIL,ZESTRIL) 2.5 MG tablet Take 1 tablet by mouth daily.    . Multiple Vitamins-Minerals (MULTIVITAMIN GUMMIES ADULT PO) Take 1 each by mouth daily.    . naproxen sodium (RA NAPROXEN SODIUM) 220 MG tablet Take 2 tablets by mouth daily.    Marland Kitchen omeprazole (PRILOSEC) 20 MG capsule Take 1 capsule by mouth 2 (two) times daily.    . pravastatin (PRAVACHOL) 10 MG tablet Take 1 tablet by mouth at bedtime.    . traZODone (DESYREL) 50 MG tablet Take 1 tablet by mouth at bedtime.     No current facility-administered medications for this visit.       Marland Kitchen  PHYSICAL EXAMINATION: ECOG PERFORMANCE STATUS: 0 - Asymptomatic  Vitals:   09/22/18 1019  BP: (!) 144/80  Pulse: 71  Resp: 18  Temp: 97.7 F (36.5 C)   Filed Weights   09/22/18 1019  Weight: 154 lb 5.2 oz (70 kg)    Physical Exam  Constitutional: She is  oriented to person, place, and time and well-developed, well-nourished, and in no distress.  HENT:  Head: Normocephalic and atraumatic.  Mouth/Throat: Oropharynx is clear and moist. No oropharyngeal exudate.  Eyes: Pupils are equal, round, and reactive to light.  Neck: Normal range of motion. Neck supple.  Cardiovascular: Normal rate and regular rhythm.  Pulmonary/Chest: No respiratory distress. She has no wheezes.  Abdominal: Soft. Bowel sounds are normal. She exhibits no distension and no mass. There is no tenderness. There is no rebound and no guarding.  Musculoskeletal: Normal range of motion. She exhibits no edema or tenderness.  Neurological: She is alert and oriented to person, place, and time.  Skin: Skin is warm.  Psychiatric: Affect normal.     LABORATORY DATA:  I have reviewed the data as listed Lab Results  Component Value Date   WBC 21.5 (H) 09/22/2018   HGB 13.5 09/22/2018   HCT 40.8 09/22/2018   MCV 95.8 09/22/2018   PLT 194 09/22/2018   Recent Labs    09/23/17 1108 03/24/18 1001 09/22/18 0951  NA 137 138 139  K 3.9 3.8 4.0  CL 103 105 101  CO2 28 25 29   GLUCOSE 186* 207* 184*  BUN 12 9 9   CREATININE 0.91 0.82 0.82  CALCIUM 8.8* 8.6* 8.8*  GFRNONAA 59* >60 >60  GFRAA >60 >60 >60  PROT 6.4* 6.5 6.9  ALBUMIN 4.0 3.8 4.2  AST 20 21 20   ALT 11* 10* 14  ALKPHOS 64 59 61  BILITOT 0.7 0.4 0.8    ASSESSMENT & PLAN:   Chronic lymphocytic leukemia (HCC) # CLL [IgVH MUTATED/13q del-FISH]~Repeat CBC- 21; today; [improving from 40s] with normal Hb; and platelet count.  I think sudden rise in the white count is likely from her recent steroid injection.  #Again had a long discussion with the patient regarding holding of any systemic therapies for her CLL given her overall stable clinical status.  Discussed the newer non-chemotherapy options for the patient's.  # Recent sinus infection- [[place]] s/p "injection"- better/ hold flu shot for now.  No clinical  suspicion of immunodeficiency.  # 25 minutes face-to-face with the patient discussing the above plan of care; more than 50% of time spent on prognosis/ natural history; counseling and coordination.  #  DISPOSITION:  # follow up in 67m/MD- labs-cbc/cmp/ldh;copy of labs     Cammie Sickle, MD 09/22/2018 1:44 PM

## 2018-09-22 NOTE — Assessment & Plan Note (Addendum)
#   CLL [IgVH MUTATED/13q del-FISH]~Repeat CBC- 21; today; [improving from 40s] with normal Hb; and platelet count.  I think sudden rise in the white count is likely from her recent steroid injection.  #Again had a long discussion with the patient regarding holding of any systemic therapies for her CLL given her overall stable clinical status.  Discussed the newer non-chemotherapy options for the patient's.  # Recent sinus infection- [[place]] s/p "injection"- better/ hold flu shot for now.  No clinical suspicion of immunodeficiency.  # 25 minutes face-to-face with the patient discussing the above plan of care; more than 50% of time spent on prognosis/ natural history; counseling and coordination.  #  DISPOSITION:  # follow up in 48m/MD- labs-cbc/cmp/ldh;copy of labs

## 2018-10-13 ENCOUNTER — Ambulatory Visit: Payer: Self-pay | Admitting: Podiatry

## 2018-10-20 ENCOUNTER — Ambulatory Visit (INDEPENDENT_AMBULATORY_CARE_PROVIDER_SITE_OTHER): Payer: Medicare Other | Admitting: Podiatry

## 2018-10-20 ENCOUNTER — Encounter: Payer: Self-pay | Admitting: Podiatry

## 2018-10-20 VITALS — BP 132/78 | HR 70 | Resp 16

## 2018-10-20 DIAGNOSIS — L600 Ingrowing nail: Secondary | ICD-10-CM | POA: Diagnosis not present

## 2018-10-20 MED ORDER — NEOMYCIN-POLYMYXIN-HC 1 % OT SOLN: OTIC | 1 refills | Status: DC

## 2018-10-20 NOTE — Progress Notes (Signed)
Subjective:  Patient ID: Melissa Curtis, female    DOB: 06/11/1941,  MRN: [id number] HPI Chief Complaint  Patient presents with  . Toe Pain    Hallux nail left - medial border, tender x 1 month, red and swollen, pressure makes worse, no treatment  . New Patient (Initial Visit)    77 y.o. female presents with the above complaint.   ROS: Denies fever chills nausea vomiting muscle aches pains calf pain back pain chest pain shortness of breath.  Past Medical History:  Diagnosis Date  . Breast cancer ([place]) 1983   had double mastectomy  . Cataracts, bilateral   . CLL (chronic lymphocytic leukemia) ([place])   . Diabetes ([place])   . GERD (gastroesophageal reflux disease)   . Hypertension   . Leukocytosis 01/2014  . Osteopenia    Past Surgical History:  Procedure Laterality Date  . APPENDECTOMY    . AUGMENTATION MAMMAPLASTY Bilateral 1983  . COLONOSCOPY     by Dr. Vira Agar over 5 years ago  . double mastectomy with breast rescontruction  1983  . MASTECTOMY Bilateral 1983   right breast ca mastecomy only no chemo no rad bilat implants     Current Outpatient Medications:  .  albuterol (PROVENTIL HFA;VENTOLIN HFA) 108 (90 Base) MCG/ACT inhaler, Inhale into the lungs., Disp: , Rfl:  .  montelukast (SINGULAIR) 10 MG tablet, Take by mouth., Disp: , Rfl:  .  aspirin EC 81 MG tablet, Take 1 tablet by mouth daily., Disp: , Rfl:  .  Calcium Carbonate-Vitamin D 600-400 MG-UNIT tablet, Take 1 tablet by mouth 2 (two) times daily., Disp: , Rfl:  .  fexofenadine (ALLEGRA) 180 MG tablet, Take 180 mg by mouth daily as needed., Disp: , Rfl:  .  fluticasone (FLONASE) 50 MCG/ACT nasal spray, Place 1 spray into both nostrils daily., Disp: , Rfl:  .  glucose blood test strip, , Disp: , Rfl:  .  lisinopril (PRINIVIL,ZESTRIL) 2.5 MG tablet, Take 1 tablet by mouth daily., Disp: , Rfl:  .  Multiple Vitamins-Minerals (MULTIVITAMIN GUMMIES ADULT PO), Take 1 each by mouth daily., Disp: , Rfl:  .   NEOMYCIN-POLYMYXIN-HYDROCORTISONE (CORTISPORIN) 1 % SOLN OTIC solution, Apply 1-2 drops to toe BID after soaking, Disp: 10 mL, Rfl: 1 .  pravastatin (PRAVACHOL) 10 MG tablet, Take 1 tablet by mouth at bedtime., Disp: , Rfl:  .  traZODone (DESYREL) 50 MG tablet, Take 1 tablet by mouth at bedtime., Disp: , Rfl:   Allergies  Allergen Reactions  . Statins Other (See Comments)    Could not tolerate Liptor due to aching legs and joints; However, she has been able to tolerate pravachol  . Iodine   . Shellfish Allergy   . Sulfa Antibiotics    Review of Systems Objective:   Vitals:   10/20/18 1039  BP: 132/78  Pulse: 70  Resp: 16    General: Well developed, nourished, in no acute distress, alert and oriented x3   Dermatological: Skin is warm, dry and supple bilateral. Nails x 10 are well maintained; remaining integument appears unremarkable at this time. There are no open sores, no preulcerative lesions, no rash or signs of infection present.  Vascular: Dorsalis Pedis artery and Posterior Tibial artery pedal pulses are 2/4 bilateral with immedate capillary fill time. Pedal hair growth present. No varicosities and no lower extremity edema present bilateral.   Neruologic: Grossly intact via light touch bilateral. Vibratory intact via tuning fork bilateral. Protective threshold with Semmes Wienstein monofilament intact to all  pedal sites bilateral. Patellar and Achilles deep tendon reflexes 2+ bilateral. No Babinski or clonus noted bilateral.   Musculoskeletal: No gross boney pedal deformities bilateral. No pain, crepitus, or limitation noted with foot and ankle range of motion bilateral. Muscular strength 5/5 in all groups tested bilateral.  Gait: Unassisted, Nonantalgic.    Radiographs:  None taken  Assessment & Plan:   Assessment: Ingrown toenail tibial border hallux left.  Plan: Chemical matrixectomy was sent was performed today with out complications.  She tolerated procedure well  after local anesthesia was administered to the hallux left.  She was provided with both oral and written home-going instruction for care and soaking of her toe as well as prescription for Cortisporin Otic to be applied twice daily after soaking.     [facility] [place], [place]

## 2018-10-20 NOTE — Patient Instructions (Signed)

## 2018-10-22 ENCOUNTER — Telehealth: Payer: Self-pay | Admitting: Podiatry

## 2018-10-22 NOTE — Telephone Encounter (Signed)
I called pt and informed that right after the procedure she would have oozing, weeping and stinging and those symptoms should decrease the further she got from the procedure date and that she should be performing soaks 2 times daily for 20 minutes and after each soak put on antibiotic drops. Pt states she was not told to get the drops and I told her the drops were at the [facility]. Pt thanked me.

## 2018-10-22 NOTE — Telephone Encounter (Signed)
Seen 11/19 for ingrown toenail removal, patient states that her toe is still sore and seems to be oozing. She is concerned if she should come in to be seen. She has a follow up appt on 11/26. Please give pt a call.

## 2018-10-26 ENCOUNTER — Telehealth: Payer: Self-pay | Admitting: *Deleted

## 2018-10-26 NOTE — Telephone Encounter (Signed)
Pt states she is having some draining from the toenail procedure 10/19/2018.

## 2018-10-26 NOTE — Telephone Encounter (Signed)
Left message informing pt the toenail procedure area could drain on and off for about 2-4 weeks to varying degrees and had burning and stinging, but the oozing and burning and stinging should decrease the further she got from the procedure date. I told pt to call on 10/27/2018 if she would like to discuss again.

## 2018-10-27 ENCOUNTER — Other Ambulatory Visit: Payer: Medicare Other

## 2018-11-03 ENCOUNTER — Ambulatory Visit: Payer: Medicare Other

## 2018-11-03 DIAGNOSIS — L600 Ingrowing nail: Secondary | ICD-10-CM

## 2018-11-03 NOTE — Patient Instructions (Signed)

## 2018-11-06 NOTE — Progress Notes (Signed)
Patient is here today for follow-up appointment, recent procedure performed 10/20/2018, removal of ingrown hallux nail border left foot.  She states that overall she is not having any pain or complications with the healing at this time.  No erythema, no redness, no drainage, no swelling, no other signs and symptoms of infection.  The area is scabbing over and appears to be healing well.  Discussed signs and symptoms of infection with the patient.  Verbal and written instructions were given.  She is to follow-up as needed with any acute symptom changes.

## 2019-03-18 ENCOUNTER — Other Ambulatory Visit: Payer: Self-pay

## 2019-03-18 DIAGNOSIS — C9110 Chronic lymphocytic leukemia of B-cell type not having achieved remission: Secondary | ICD-10-CM

## 2019-03-23 ENCOUNTER — Other Ambulatory Visit: Payer: Self-pay

## 2019-03-23 ENCOUNTER — Inpatient Hospital Stay: Payer: Medicare Other | Attending: Hematology and Oncology

## 2019-03-23 ENCOUNTER — Ambulatory Visit: Payer: Medicare Other | Admitting: Internal Medicine

## 2019-03-23 DIAGNOSIS — C9110 Chronic lymphocytic leukemia of B-cell type not having achieved remission: Secondary | ICD-10-CM | POA: Diagnosis not present

## 2019-03-23 LAB — CBC WITH DIFFERENTIAL/PLATELET
Abs Immature Granulocytes: 0 10*3/uL (ref 0.00–0.07)
Basophils Absolute: 0 10*3/uL (ref 0.0–0.1)
Basophils Relative: 0 %
Eosinophils Absolute: 0.5 10*3/uL (ref 0.0–0.5)
Eosinophils Relative: 3 %
HCT: 43.8 % (ref 36.0–46.0)
Hemoglobin: 14.6 g/dL (ref 12.0–15.0)
Lymphocytes Relative: 79 %
Lymphs Abs: 13 10*3/uL — ABNORMAL HIGH (ref 0.7–4.0)
MCH: 31.2 pg (ref 26.0–34.0)
MCHC: 33.3 g/dL (ref 30.0–36.0)
MCV: 93.6 fL (ref 80.0–100.0)
Monocytes Absolute: 0.5 10*3/uL (ref 0.1–1.0)
Monocytes Relative: 3 %
Neutro Abs: 2.5 10*3/uL (ref 1.7–7.7)
Neutrophils Relative %: 15 %
Platelets: 159 10*3/uL (ref 150–400)
RBC: 4.68 MIL/uL (ref 3.87–5.11)
RDW: 12.9 % (ref 11.5–15.5)
WBC: 16.4 10*3/uL — ABNORMAL HIGH (ref 4.0–10.5)
nRBC: 0 % (ref 0.0–0.2)

## 2019-03-23 LAB — COMPREHENSIVE METABOLIC PANEL
ALT: 13 U/L (ref 0–44)
AST: 17 U/L (ref 15–41)
Albumin: 4.3 g/dL (ref 3.5–5.0)
Alkaline Phosphatase: 64 U/L (ref 38–126)
Anion gap: 7 (ref 5–15)
BUN: 11 mg/dL (ref 8–23)
CO2: 26 mmol/L (ref 22–32)
Calcium: 8.9 mg/dL (ref 8.9–10.3)
Chloride: 103 mmol/L (ref 98–111)
Creatinine, Ser: 0.86 mg/dL (ref 0.44–1.00)
GFR calc Af Amer: >60 mL/min (ref >60)
GFR calc non Af Amer: >60 mL/min (ref >60)
Glucose, Bld: 141 mg/dL — ABNORMAL HIGH (ref 70–99)
Potassium: 3.5 mmol/L (ref 3.5–5.1)
Sodium: 136 mmol/L (ref 135–145)
Total Bilirubin: 0.9 mg/dL (ref 0.3–1.2)
Total Protein: 7 g/dL (ref 6.5–8.1)

## 2019-03-23 LAB — LACTATE DEHYDROGENASE: LDH: 130 U/L (ref 98–192)

## 2019-03-24 ENCOUNTER — Encounter: Payer: Self-pay | Admitting: Internal Medicine

## 2019-03-24 ENCOUNTER — Inpatient Hospital Stay (HOSPITAL_BASED_OUTPATIENT_CLINIC_OR_DEPARTMENT_OTHER): Payer: Medicare Other | Admitting: Internal Medicine

## 2019-03-24 DIAGNOSIS — C9110 Chronic lymphocytic leukemia of B-cell type not having achieved remission: Secondary | ICD-10-CM

## 2019-03-24 NOTE — Assessment & Plan Note (Addendum)
#   CLL [IgVH MUTATED/13q del-FISH]~white count 16 normal hemoglobin platelet count.  No new symptoms/or concerns for initiation of treatment.  Continue surveillance.  # DISPOSITION:  # follow up in 89m/MD- labs-cbc/cmp/ldh- Dr.B

## 2019-03-24 NOTE — Progress Notes (Signed)
I connected with Melissa Curtis on 03/24/19 at  1:15 PM EDT by telephone visit and verified that I am speaking with the correct person using two identifiers.  I discussed the limitations, risks, security and privacy concerns of performing an evaluation and management service by telemedicine and the availability of in-person appointments. I also discussed with the patient that there may be a patient responsible charge related to this service. The patient expressed understanding and agreed to proceed.    Other persons participating in the visit and their role in the encounter: none   Patient's location: home  Provider's location: home   Oncology History   # OCT 2016- CLL [peripheral blood flow] Rai Stage 0; CD-38-NEG; FISH- POS 13q del/ IgVH MUTATED [Good Prog] CT C/A/P- NO LN/spleen.    # 1983- Hx of Right breast ca?; s/p Mastecomy bil [as per pt]; no anti-hormonal therapy/chemo/RT     Chronic lymphocytic leukemia ([place])     Chief Complaint: CLL    History of present illness:Melissa Curtis 78 y.o.  female with history of CLL currently on surveillance.  Patient denies any night sweats or weight loss.  Denies any new lumps or bumps.  Observation/objective: White count is 16 hemoglobin is 13 platelets are normal.  Assessment and plan: Chronic lymphocytic leukemia (HCC) # CLL [IgVH MUTATED/13q del-FISH]~white count 16 normal hemoglobin platelet count.  No new symptoms/or concerns for initiation of treatment.  Continue surveillance.  # DISPOSITION:  # follow up in 19m/MD- labs-cbc/cmp/ldh- Dr.B    Follow-up instructions:  I discussed the assessment and treatment plan with the patient.  The patient was provided an opportunity to ask questions and all were answered.  The patient agreed with the plan and demonstrated understanding of instructions.  The patient was advised to call back or seek an in person evaluation if the symptoms worsen or if the condition fails to improve as  anticipated.  I provided 12 minutes of non face-to-face telephone visit time during this encounter, and > 50% was spent counseling as documented under my assessment & plan.   Dr. Charlaine Dalton [place] at [facility] 03/24/2019 1:25 PM

## 2019-03-25 ENCOUNTER — Other Ambulatory Visit: Payer: Self-pay

## 2019-03-25 DIAGNOSIS — C9110 Chronic lymphocytic leukemia of B-cell type not having achieved remission: Secondary | ICD-10-CM

## 2019-06-03 DIAGNOSIS — R519 Headache, unspecified: Secondary | ICD-10-CM | POA: Insufficient documentation

## 2019-09-22 ENCOUNTER — Other Ambulatory Visit: Payer: Self-pay

## 2019-09-22 ENCOUNTER — Inpatient Hospital Stay (HOSPITAL_BASED_OUTPATIENT_CLINIC_OR_DEPARTMENT_OTHER): Payer: Medicare Other | Admitting: Nurse Practitioner

## 2019-09-22 ENCOUNTER — Inpatient Hospital Stay: Payer: Medicare Other | Attending: Oncology

## 2019-09-22 VITALS — BP 138/75 | HR 70 | Temp 97.9°F | Resp 20 | Ht 63.0 in | Wt 156.4 lb

## 2019-09-22 DIAGNOSIS — C9110 Chronic lymphocytic leukemia of B-cell type not having achieved remission: Secondary | ICD-10-CM | POA: Diagnosis not present

## 2019-09-22 DIAGNOSIS — F4320 Adjustment disorder, unspecified: Secondary | ICD-10-CM | POA: Insufficient documentation

## 2019-09-22 DIAGNOSIS — Z87891 Personal history of nicotine dependence: Secondary | ICD-10-CM | POA: Insufficient documentation

## 2019-09-22 DIAGNOSIS — F329 Major depressive disorder, single episode, unspecified: Secondary | ICD-10-CM | POA: Insufficient documentation

## 2019-09-22 LAB — CBC WITH DIFFERENTIAL/PLATELET
Abs Immature Granulocytes: 0.02 10*3/uL (ref 0.00–0.07)
Basophils Absolute: 0.1 10*3/uL (ref 0.0–0.1)
Basophils Relative: 0 %
Eosinophils Absolute: 0.1 10*3/uL (ref 0.0–0.5)
Eosinophils Relative: 1 %
HCT: 40.2 % (ref 36.0–46.0)
Hemoglobin: 13.5 g/dL (ref 12.0–15.0)
Immature Granulocytes: 0 %
Lymphocytes Relative: 79 %
Lymphs Abs: 11.7 10*3/uL — ABNORMAL HIGH (ref 0.7–4.0)
MCH: 31.3 pg (ref 26.0–34.0)
MCHC: 33.6 g/dL (ref 30.0–36.0)
MCV: 93.3 fL (ref 80.0–100.0)
Monocytes Absolute: 0.6 10*3/uL (ref 0.1–1.0)
Monocytes Relative: 4 %
Neutro Abs: 2.4 10*3/uL (ref 1.7–7.7)
Neutrophils Relative %: 16 %
Platelets: 159 10*3/uL (ref 150–400)
RBC: 4.31 MIL/uL (ref 3.87–5.11)
RDW: 13 % (ref 11.5–15.5)
Smear Review: NORMAL
WBC: 14.9 10*3/uL — ABNORMAL HIGH (ref 4.0–10.5)
nRBC: 0.3 % — ABNORMAL HIGH (ref 0.0–0.2)

## 2019-09-22 LAB — COMPREHENSIVE METABOLIC PANEL
ALT: 16 U/L (ref 0–44)
AST: 21 U/L (ref 15–41)
Albumin: 4.1 g/dL (ref 3.5–5.0)
Alkaline Phosphatase: 63 U/L (ref 38–126)
Anion gap: 6 (ref 5–15)
BUN: 10 mg/dL (ref 8–23)
CO2: 25 mmol/L (ref 22–32)
Calcium: 8.9 mg/dL (ref 8.9–10.3)
Chloride: 108 mmol/L (ref 98–111)
Creatinine, Ser: 0.76 mg/dL (ref 0.44–1.00)
GFR calc Af Amer: >60 mL/min (ref >60)
GFR calc non Af Amer: >60 mL/min (ref >60)
Glucose, Bld: 183 mg/dL — ABNORMAL HIGH (ref 70–99)
Potassium: 3.8 mmol/L (ref 3.5–5.1)
Sodium: 139 mmol/L (ref 135–145)
Total Bilirubin: 0.7 mg/dL (ref 0.3–1.2)
Total Protein: 6.8 g/dL (ref 6.5–8.1)

## 2019-09-22 LAB — LACTATE DEHYDROGENASE: LDH: 133 U/L (ref 98–192)

## 2019-09-22 NOTE — Assessment & Plan Note (Signed)
#  CLL - IgVH Mutated/13 q del-FISH. White count elevated but improved/stable at 14.9. ALC 11 7 (improved), hemoglobin 13.5 (stable).  Clinically asymptomatic.  No new symptoms or concerns for initiation of treatment.  Recommend continued surveillance.  # Depression/Adjustment Disorder-recommended patient discuss symptoms with her primary care provider and may consider counseling.  DISPOSITION:  # follow up in 58m - labs (CBC, CMP, LDH), Dr. Rogue Bussing

## 2019-09-22 NOTE — Progress Notes (Signed)
[facility] OFFICE PROGRESS NOTE  Patient Care Team: Sharyne Peach, MD as PCP - General (Family Medicine)  CHIEF COMPLAINTS/PURPOSE OF CONSULTATION:   Oncology History Overview Note  # OCT 2016- CLL [peripheral blood flow] Rai Stage 0; CD-38-NEG; FISH- POS 13q del/ IgVH MUTATED [Good Prog] CT C/A/P- NO LN/spleen.    # 1983- Hx of Right breast ca?; s/p Mastecomy bil [as per pt]; no anti-hormonal therapy/chemo/RT   Chronic lymphocytic leukemia (HCC)    HISTORY OF PRESENTING ILLNESS:  Melissa Curtis 78 y.o.  female patient with above history of CLL, currently on surveillance, returns to clinic for follow-up.  She has had some fatigue and depressed mood which she has accounted to social isolation during Covid.  Also complains of possible dementia in her husband which is causing a strain on her relationship.  She denies significant fatigue however.  Denies night sweats, denies unintentional weight loss, denies fevers.  Denies new lumps or bumps.  Denies abdominal pain.  Review of Systems  Constitutional: Negative for chills, diaphoresis, fever, malaise/fatigue and weight loss.  HENT: Negative for nosebleeds and sore throat.   Eyes: Negative for double vision.  Respiratory: Negative for cough, hemoptysis, sputum production, shortness of breath and wheezing.   Cardiovascular: Negative for chest pain, palpitations, orthopnea and leg swelling.  Gastrointestinal: Negative for abdominal pain, blood in stool, constipation, diarrhea, heartburn, melena, nausea and vomiting.  Genitourinary: Negative for dysuria, frequency and urgency.  Musculoskeletal: Negative for back pain and joint pain.  Skin: Negative.  Negative for itching and rash.  Neurological: Negative for dizziness, tingling, focal weakness, weakness and headaches.  Endo/Heme/Allergies: Does not bruise/bleed easily.  Psychiatric/Behavioral: Positive for depression. The patient is not nervous/anxious and does not have  insomnia.      MEDICAL HISTORY:  Past Medical History:  Diagnosis Date  . Breast cancer ([place]) 1983   had double mastectomy  . Cataracts, bilateral   . CLL (chronic lymphocytic leukemia) ([place])   . Diabetes ([place])   . GERD (gastroesophageal reflux disease)   . Hypertension   . Leukocytosis 01/2014  . Osteopenia     SURGICAL HISTORY: Past Surgical History:  Procedure Laterality Date  . APPENDECTOMY    . AUGMENTATION MAMMAPLASTY Bilateral 1983  . COLONOSCOPY     by Dr. Vira Agar over 5 years ago  . double mastectomy with breast rescontruction  1983  . MASTECTOMY Bilateral 1983   right breast ca mastecomy only no chemo no rad bilat implants     SOCIAL HISTORY: lives in [place].  Social History   Socioeconomic History  . Marital status: Married    Spouse name: Not on file  . Number of children: Not on file  . Years of education: Not on file  . Highest education level: Not on file  Occupational History  . Not on file  Social Needs  . Financial resource strain: Not on file  . Food insecurity    Worry: Not on file    Inability: Not on file  . Transportation needs    Medical: Not on file    Non-medical: Not on file  Tobacco Use  . Smoking status: Former Smoker    Quit date: 12/03/1995    Years since quitting: 23.8  . Smokeless tobacco: Never Used  Substance and Sexual Activity  . Alcohol use: No    Alcohol/week: 0.0 standard drinks  . Drug use: No  . Sexual activity: Yes    Partners: Male  Lifestyle  . Physical  activity    Days per week: Not on file    Minutes per session: Not on file  . Stress: Not on file  Relationships  . Social Herbalist on phone: Not on file    Gets together: Not on file    Attends religious service: Not on file    Active member of club or organization: Not on file    Attends meetings of clubs or organizations: Not on file    Relationship status: Not on file  . Intimate partner violence    Fear of current or ex partner: Not on  file    Emotionally abused: Not on file    Physically abused: Not on file    Forced sexual activity: Not on file  Other Topics Concern  . Not on file  Social History Narrative  . Not on file    FAMILY HISTORY: Family History  Problem Relation Age of Onset  . Leukemia Father 63  . Non-Hodgkin's lymphoma Mother 52  . Colon cancer Paternal Grandmother        age unknown  . Breast cancer Cousin        paternal cancer    ALLERGIES:  is allergic to statins; iodine; shellfish allergy; and sulfa antibiotics.  MEDICATIONS:  Current Outpatient Medications  Medication Sig Dispense Refill  . albuterol (PROVENTIL HFA;VENTOLIN HFA) 108 (90 Base) MCG/ACT inhaler Inhale into the lungs.    Marland Kitchen aspirin EC 81 MG tablet Take 1 tablet by mouth daily.    . Calcium Carbonate-Vitamin D 600-400 MG-UNIT tablet Take 1 tablet by mouth 2 (two) times daily.    . fexofenadine (ALLEGRA) 180 MG tablet Take 180 mg by mouth daily as needed.    . montelukast (SINGULAIR) 10 MG tablet Take by mouth.    . Multiple Vitamins-Minerals (MULTIVITAMIN GUMMIES ADULT PO) Take 1 each by mouth daily.    . NEOMYCIN-POLYMYXIN-HYDROCORTISONE (CORTISPORIN) 1 % SOLN OTIC solution Apply 1-2 drops to toe BID after soaking 10 mL 1  . pravastatin (PRAVACHOL) 10 MG tablet Take 1 tablet by mouth at bedtime.    . traZODone (DESYREL) 50 MG tablet Take 1 tablet by mouth at bedtime.    . fluticasone (FLONASE) 50 MCG/ACT nasal spray Place 1 spray into both nostrils daily.    Marland Kitchen glucose blood test strip     . lisinopril (PRINIVIL,ZESTRIL) 2.5 MG tablet Take 1 tablet by mouth daily.     No current facility-administered medications for this visit.     PHYSICAL EXAMINATION: ECOG PERFORMANCE STATUS: 0 - Asymptomatic  Vitals:   09/22/19 1412  BP: 138/75  Pulse: 70  Resp: 20  Temp: 97.9 F (36.6 C)  SpO2: 98%   Filed Weights   09/22/19 1412  Weight: 156 lb 6.4 oz (70.9 kg)    Physical Exam  Constitutional: She is oriented to  person, place, and time and well-developed, well-nourished, and in no distress.  HENT:  Head: Normocephalic and atraumatic.  Mouth/Throat: Oropharynx is clear and moist. No oropharyngeal exudate.  Eyes: Conjunctivae are normal. No scleral icterus.  Neck: Normal range of motion. Neck supple.  Cardiovascular: Normal rate and regular rhythm.  Pulmonary/Chest: Effort normal and breath sounds normal. No respiratory distress. She has no wheezes.  Abdominal: Soft. Bowel sounds are normal. She exhibits no distension and no mass. There is no abdominal tenderness. There is no rebound and no guarding.  Musculoskeletal: Normal range of motion.        General: No tenderness or  edema.  Lymphadenopathy:    She has no cervical adenopathy.  Neurological: She is alert and oriented to person, place, and time.  Skin: Skin is warm and dry.  Psychiatric: Mood and affect normal.    LABORATORY DATA:  I have reviewed the data as listed Lab Results  Component Value Date   WBC 14.9 (H) 09/22/2019   HGB 13.5 09/22/2019   HCT 40.2 09/22/2019   MCV 93.3 09/22/2019   PLT 159 09/22/2019   Recent Labs    03/23/19 0949 09/22/19 1347  NA 136 139  K 3.5 3.8  CL 103 108  CO2 26 25  GLUCOSE 141* 183*  BUN 11 10  CREATININE 0.86 0.76  CALCIUM 8.9 8.9  GFRNONAA >60 >60  GFRAA >60 >60  PROT 7.0 6.8  ALBUMIN 4.3 4.1  AST 17 21  ALT 13 16  ALKPHOS 64 63  BILITOT 0.9 0.7    ASSESSMENT & PLAN:     No problem-specific Assessment & Plan notes found for this encounter.     Verlon Au, NP 09/22/2019 4:01 PM

## 2019-10-20 ENCOUNTER — Other Ambulatory Visit: Payer: Self-pay

## 2019-10-20 DIAGNOSIS — Z20822 Contact with and (suspected) exposure to covid-19: Secondary | ICD-10-CM

## 2019-10-21 LAB — NOVEL CORONAVIRUS, NAA: SARS-CoV-2, NAA: NOT DETECTED

## 2020-03-27 ENCOUNTER — Other Ambulatory Visit: Payer: Self-pay

## 2020-03-27 ENCOUNTER — Inpatient Hospital Stay (HOSPITAL_BASED_OUTPATIENT_CLINIC_OR_DEPARTMENT_OTHER): Payer: Medicare Other | Admitting: Internal Medicine

## 2020-03-27 ENCOUNTER — Inpatient Hospital Stay: Payer: Medicare Other | Attending: Internal Medicine

## 2020-03-27 DIAGNOSIS — F4321 Adjustment disorder with depressed mood: Secondary | ICD-10-CM | POA: Diagnosis not present

## 2020-03-27 DIAGNOSIS — C9110 Chronic lymphocytic leukemia of B-cell type not having achieved remission: Secondary | ICD-10-CM

## 2020-03-27 LAB — COMPREHENSIVE METABOLIC PANEL
ALT: 17 U/L (ref 0–44)
AST: 19 U/L (ref 15–41)
Albumin: 4.4 g/dL (ref 3.5–5.0)
Alkaline Phosphatase: 59 U/L (ref 38–126)
Anion gap: 9 (ref 5–15)
BUN: 11 mg/dL (ref 8–23)
CO2: 28 mmol/L (ref 22–32)
Calcium: 9.1 mg/dL (ref 8.9–10.3)
Chloride: 103 mmol/L (ref 98–111)
Creatinine, Ser: 0.91 mg/dL (ref 0.44–1.00)
GFR calc Af Amer: >60 mL/min (ref >60)
GFR calc non Af Amer: 60 mL/min — ABNORMAL LOW (ref >60)
Glucose, Bld: 160 mg/dL — ABNORMAL HIGH (ref 70–99)
Potassium: 3.9 mmol/L (ref 3.5–5.1)
Sodium: 140 mmol/L (ref 135–145)
Total Bilirubin: 0.7 mg/dL (ref 0.3–1.2)
Total Protein: 7 g/dL (ref 6.5–8.1)

## 2020-03-27 LAB — CBC WITH DIFFERENTIAL/PLATELET
Abs Immature Granulocytes: 0.02 10*3/uL (ref 0.00–0.07)
Basophils Absolute: 0.1 10*3/uL (ref 0.0–0.1)
Basophils Relative: 1 %
Eosinophils Absolute: 0.1 10*3/uL (ref 0.0–0.5)
Eosinophils Relative: 0 %
HCT: 43.6 % (ref 36.0–46.0)
Hemoglobin: 14.8 g/dL (ref 12.0–15.0)
Immature Granulocytes: 0 %
Lymphocytes Relative: 76 %
Lymphs Abs: 12.6 10*3/uL — ABNORMAL HIGH (ref 0.7–4.0)
MCH: 31.4 pg (ref 26.0–34.0)
MCHC: 33.9 g/dL (ref 30.0–36.0)
MCV: 92.6 fL (ref 80.0–100.0)
Monocytes Absolute: 0.5 10*3/uL (ref 0.1–1.0)
Monocytes Relative: 3 %
Neutro Abs: 3.3 10*3/uL (ref 1.7–7.7)
Neutrophils Relative %: 20 %
Platelets: 178 10*3/uL (ref 150–400)
RBC: 4.71 MIL/uL (ref 3.87–5.11)
RDW: 12.5 % (ref 11.5–15.5)
Smear Review: NORMAL
WBC: 16.6 10*3/uL — ABNORMAL HIGH (ref 4.0–10.5)
nRBC: 0 % (ref 0.0–0.2)

## 2020-03-27 LAB — LACTATE DEHYDROGENASE: LDH: 137 U/L (ref 98–192)

## 2020-03-27 NOTE — Assessment & Plan Note (Addendum)
#  CLL - IgVH Mutated/13 q del-FISH. White count elevated but improved/stable!16; normal hemoglobin/platelets.  #Clinically asymptomatic.  Continue to hold any further therapies at this time.  Continue surveillance every 6 months  DISPOSITION:  # follow up in 14m - labs (CBC, CMP, LDH), Dr. Jacinto Reap

## 2020-03-27 NOTE — Progress Notes (Signed)
[facility] CONSULT NOTE  Patient Care Team: Sharyne Peach, MD as PCP - General (Family Medicine)  CHIEF COMPLAINTS/PURPOSE OF CONSULTATION:   Oncology History Overview Note  # OCT 2016- CLL [peripheral blood flow] Rai Stage 0; CD-38-NEG; FISH- POS 13q del/ IgVH MUTATED [Good Prog] CT C/A/P- NO LN/spleen.    # 1983- Hx of Right breast ca?; s/p Mastecomy bil [as per pt]; no anti-hormonal therapy/chemo/RT   Chronic lymphocytic leukemia (HCC)     HISTORY OF PRESENTING ILLNESS:  Melissa Curtis 79 y.o.  female  above history of CLL- currently on surveillance Is here for follow-up.  Patient denies any lumps or bumps with any weight loss.  No night sweats.  No recent infection or admissions to the hospital.  Review of Systems  Constitutional: Negative for chills, diaphoresis, fever, malaise/fatigue and weight loss.  HENT: Negative for nosebleeds and sore throat.   Eyes: Negative for double vision.  Respiratory: Negative for cough, hemoptysis, sputum production, shortness of breath and wheezing.   Cardiovascular: Negative for chest pain, palpitations, orthopnea and leg swelling.  Gastrointestinal: Negative for abdominal pain, blood in stool, constipation, diarrhea, heartburn, melena, nausea and vomiting.  Genitourinary: Negative for dysuria, frequency and urgency.  Musculoskeletal: Negative for back pain and joint pain.  Skin: Negative.  Negative for itching and rash.  Neurological: Negative for dizziness, tingling, focal weakness, weakness and headaches.  Endo/Heme/Allergies: Does not bruise/bleed easily.  Psychiatric/Behavioral: Negative for depression. The patient is not nervous/anxious and does not have insomnia.      MEDICAL HISTORY:  Past Medical History:  Diagnosis Date  . Breast cancer ([place]) 1983   had double mastectomy  . Cataracts, bilateral   . CLL (chronic lymphocytic leukemia) ([place])   . Diabetes ([place])   . GERD (gastroesophageal reflux disease)    . Hypertension   . Leukocytosis 01/2014  . Osteopenia     SURGICAL HISTORY: Past Surgical History:  Procedure Laterality Date  . APPENDECTOMY    . AUGMENTATION MAMMAPLASTY Bilateral 1983  . COLONOSCOPY     by Dr. Vira Agar over 5 years ago  . double mastectomy with breast rescontruction  1983  . MASTECTOMY Bilateral 1983   right breast ca mastecomy only no chemo no rad bilat implants     SOCIAL HISTORY: lives in [place].  Social History   Socioeconomic History  . Marital status: Married    Spouse name: Not on file  . Number of children: Not on file  . Years of education: Not on file  . Highest education level: Not on file  Occupational History  . Not on file  Tobacco Use  . Smoking status: Former Smoker    Quit date: 12/03/1995    Years since quitting: 24.3  . Smokeless tobacco: Never Used  Substance and Sexual Activity  . Alcohol use: No    Alcohol/week: 0.0 standard drinks  . Drug use: No  . Sexual activity: Yes    Partners: Male  Other Topics Concern  . Not on file  Social History Narrative  . Not on file   Social Determinants of Health   Financial Resource Strain:   . Difficulty of Paying Living Expenses:   Food Insecurity:   . Worried About Charity fundraiser in the Last Year:   . Arboriculturist in the Last Year:   Transportation Needs:   . Film/video editor (Medical):   Marland Kitchen Lack of Transportation (Non-Medical):   Physical Activity:   . Days  of Exercise per Week:   . Minutes of Exercise per Session:   Stress:   . Feeling of Stress :   Social Connections:   . Frequency of Communication with Friends and Family:   . Frequency of Social Gatherings with Friends and Family:   . Attends Religious Services:   . Active Member of Clubs or Organizations:   . Attends Archivist Meetings:   Marland Kitchen Marital Status:   Intimate Partner Violence:   . Fear of Current or Ex-Partner:   . Emotionally Abused:   Marland Kitchen Physically Abused:   . Sexually Abused:      FAMILY HISTORY: Family History  Problem Relation Age of Onset  . Leukemia Father 81  . Non-Hodgkin's lymphoma Mother 38  . Colon cancer Paternal Grandmother        age unknown  . Breast cancer Cousin        paternal cancer    ALLERGIES:  is allergic to statins; iodine; shellfish allergy; and sulfa antibiotics.  MEDICATIONS:  Current Outpatient Medications  Medication Sig Dispense Refill  . aspirin EC 81 MG tablet Take 1 tablet by mouth daily.    . Calcium Carbonate-Vitamin D 600-400 MG-UNIT tablet Take 1 tablet by mouth 2 (two) times daily.    Marland Kitchen glucose blood test strip     . lisinopril (PRINIVIL,ZESTRIL) 2.5 MG tablet Take 1 tablet by mouth daily.    . montelukast (SINGULAIR) 10 MG tablet Take by mouth.    . Multiple Vitamins-Minerals (MULTIVITAMIN GUMMIES ADULT PO) Take 1 each by mouth daily.    Marland Kitchen omeprazole (PRILOSEC) 20 MG capsule Take 20 mg by mouth daily.    . pravastatin (PRAVACHOL) 10 MG tablet Take 1 tablet by mouth at bedtime.    . traZODone (DESYREL) 50 MG tablet Take 1 tablet by mouth at bedtime.    . fexofenadine (ALLEGRA) 180 MG tablet Take 180 mg by mouth daily as needed.    . fluticasone (FLONASE) 50 MCG/ACT nasal spray Place 1 spray into both nostrils daily.    . NEOMYCIN-POLYMYXIN-HYDROCORTISONE (CORTISPORIN) 1 % SOLN OTIC solution Apply 1-2 drops to toe BID after soaking (Patient not taking: Reported on 03/27/2020) 10 mL 1   No current facility-administered medications for this visit.      Marland Kitchen  PHYSICAL EXAMINATION: ECOG PERFORMANCE STATUS: 0 - Asymptomatic  There were no vitals filed for this visit. Filed Weights   03/27/20 1415  Weight: 151 lb (68.5 kg)    Physical Exam  Constitutional: She is oriented to person, place, and time and well-developed, well-nourished, and in no distress.  HENT:  Head: Normocephalic and atraumatic.  Mouth/Throat: Oropharynx is clear and moist. No oropharyngeal exudate.  Eyes: Pupils are equal, round, and reactive to  light.  Cardiovascular: Normal rate and regular rhythm.  Pulmonary/Chest: No respiratory distress. She has no wheezes.  Abdominal: Soft. Bowel sounds are normal. She exhibits no distension and no mass. There is no abdominal tenderness. There is no rebound and no guarding.  Musculoskeletal:        General: No tenderness or edema. Normal range of motion.     Cervical back: Normal range of motion and neck supple.  Neurological: She is alert and oriented to person, place, and time.  Skin: Skin is warm.  Psychiatric: Affect normal.     LABORATORY DATA:  I have reviewed the data as listed Lab Results  Component Value Date   WBC 16.6 (H) 03/27/2020   HGB 14.8 03/27/2020   HCT  43.6 03/27/2020   MCV 92.6 03/27/2020   PLT 178 03/27/2020   Recent Labs    09/22/19 1347 03/27/20 1350  NA 139 140  K 3.8 3.9  CL 108 103  CO2 25 28  GLUCOSE 183* 160*  BUN 10 11  CREATININE 0.76 0.91  CALCIUM 8.9 9.1  GFRNONAA >60 60*  GFRAA >60 >60  PROT 6.8 7.0  ALBUMIN 4.1 4.4  AST 21 19  ALT 16 17  ALKPHOS 63 59  BILITOT 0.7 0.7    ASSESSMENT & PLAN:   Chronic lymphocytic leukemia (HCC) #CLL - IgVH Mutated/13 q del-FISH. White count elevated but improved/stable at 14.9. ALC 11 7 (improved), hemoglobin 13.5 (stable).  Clinically asymptomatic.  No new symptoms or concerns for initiation of treatment.  Recommend continued surveillance.  # Depression/Adjustment Disorder-recommended patient discuss symptoms with her primary care provider and may consider counseling.  DISPOSITION:  # follow up in 50m - labs (CBC, CMP, LDH), Dr. Dallie Piles, MD 03/27/2020 2:22 PM

## 2020-05-12 ENCOUNTER — Other Ambulatory Visit: Payer: Self-pay | Admitting: Neurosurgery

## 2020-05-12 DIAGNOSIS — D329 Benign neoplasm of meninges, unspecified: Secondary | ICD-10-CM

## 2020-06-14 ENCOUNTER — Ambulatory Visit
Admission: RE | Admit: 2020-06-14 | Discharge: 2020-06-14 | Disposition: A | Discharge status: Home / Self Care | Payer: Medicare Other | Source: Ambulatory Visit | Attending: Neurosurgery | Admitting: Neurosurgery

## 2020-06-14 ENCOUNTER — Other Ambulatory Visit: Payer: Self-pay

## 2020-06-14 DIAGNOSIS — D329 Benign neoplasm of meninges, unspecified: Secondary | ICD-10-CM | POA: Insufficient documentation

## 2020-06-14 MED ORDER — GADOBUTROL 1 MMOL/ML IV SOLN
6.0000 mL | Freq: Once | INTRAVENOUS | Status: AC | PRN
  Administered 2020-06-14: 6 mL via INTRAVENOUS

## 2020-08-10 ENCOUNTER — Other Ambulatory Visit: Payer: Self-pay | Admitting: Family Medicine

## 2020-08-10 DIAGNOSIS — Z1231 Encounter for screening mammogram for malignant neoplasm of breast: Secondary | ICD-10-CM

## 2020-08-10 DIAGNOSIS — Z780 Asymptomatic menopausal state: Secondary | ICD-10-CM

## 2020-09-26 ENCOUNTER — Inpatient Hospital Stay: Payer: Medicare Other | Attending: Internal Medicine

## 2020-09-26 ENCOUNTER — Inpatient Hospital Stay: Payer: Medicare Other | Admitting: Internal Medicine

## 2020-10-19 IMAGING — MR MR HEAD WO/W CM
14 series · 44 of 48 positions shown · IV contrast (gadavist)
Comparison: Brain MRI 10/11/2006

CLINICAL DATA: Provided history: Meningioma. Additional history
provided by scanning [HOSPITAL]year follow-up meningioma,
chronic hearing loss on left side.

EXAM:
MRI HEAD WITHOUT AND WITH CONTRAST
TECHNIQUE: Multiplanar, multiecho pulse sequences of the brain and surrounding
structures were obtained without and with intravenous contrast.
CONTRAST:  6mL GADAVIST GADOBUTROL 1 MMOL/ML IV SOLN

[Series 5: ax dwi_tracew · axial · 3.0mm · 0.60mm/px · z∈[-83,+69]mm · 4 of 48 slices shown]
[im 1/48]
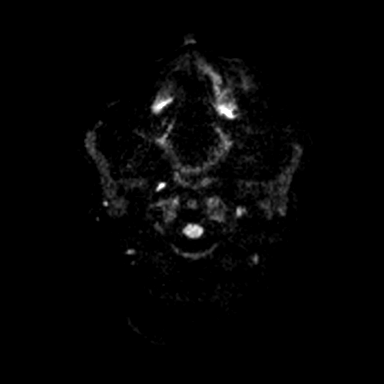
[im 16/48]
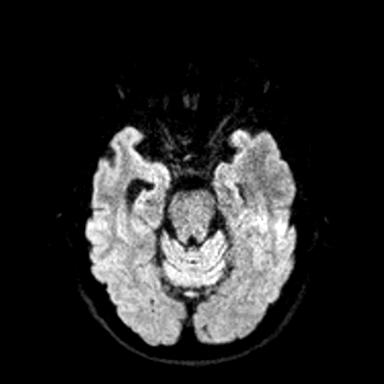
[im 32/48]
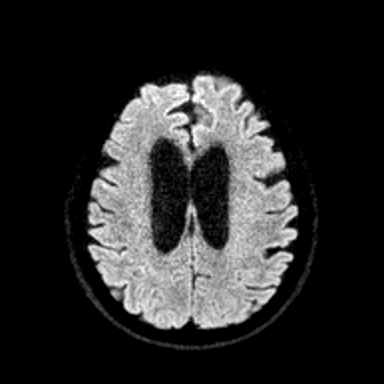
[im 48/48]
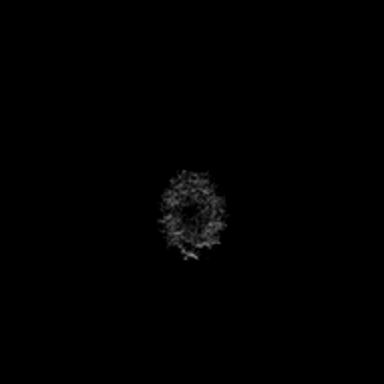

[Series 6: ax dwi_adc · axial · 3.0mm · 0.60mm/px · z∈[-83,+69]mm · 4 of 48 slices shown]
[im 1/48]
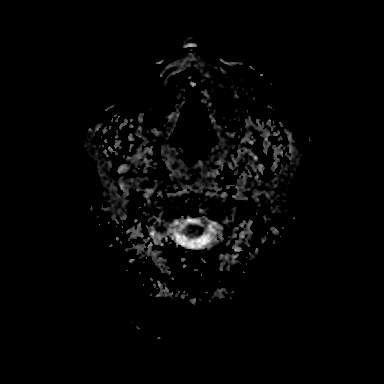
[im 16/48]
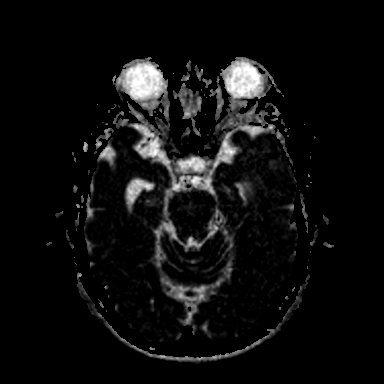
[im 32/48]
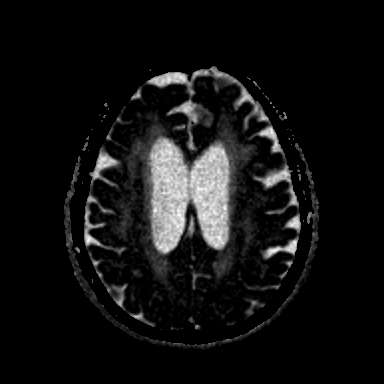
[im 48/48]
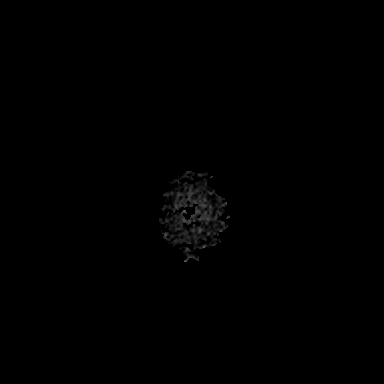

[Series 7: cor dwi_tracew · coronal · 5.0mm · 0.60mm/px · 2 of 40 slices shown]
[im 1/40]
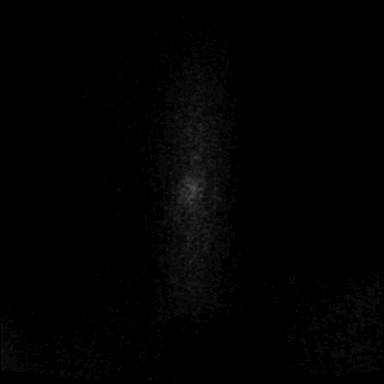
[im 40/40]
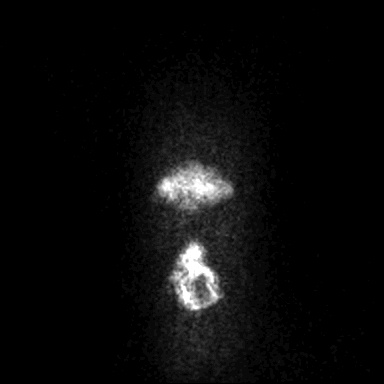

[Series 8: cor dwi_adc · coronal · 5.0mm · 0.60mm/px · 2 of 39 slices shown]
[im 1/39]
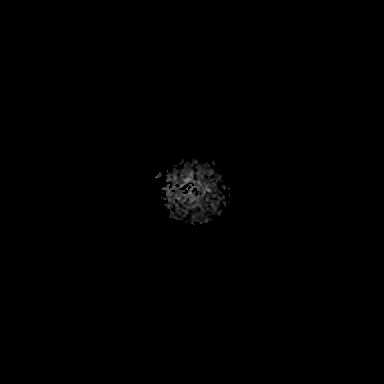
[im 39/39]
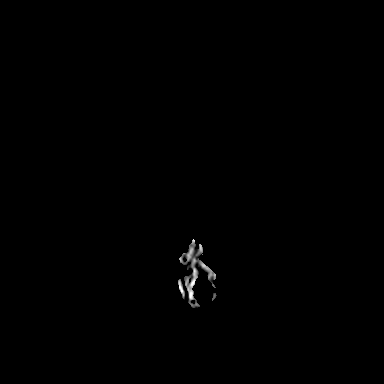

[Series 9: T1 · sagittal · 5.0mm · 0.62mm/px · 1 of 21 slices shown (1 of 2)]
[im 1/21]
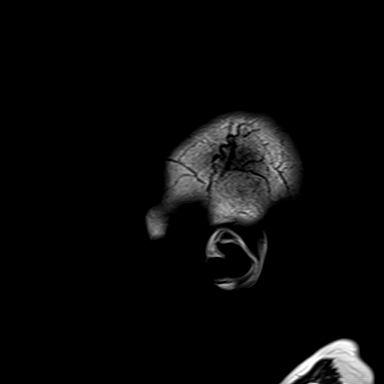

[Series 10: T2 · axial · 5.0mm · 0.53mm/px · 1 of 25 slices shown]
[im 1/25]
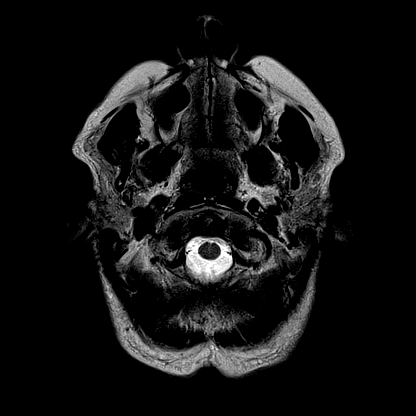

[Series 12: pha_images · axial · 3.0mm · 0.90mm/px · z∈[-93,+75]mm · 3 of 56 slices shown]
[im 1/56]
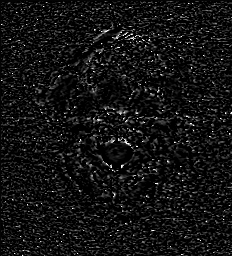
[im 28/56]
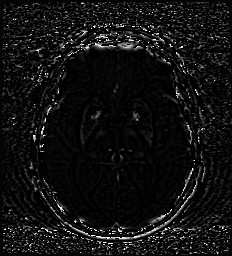
[im 56/56]
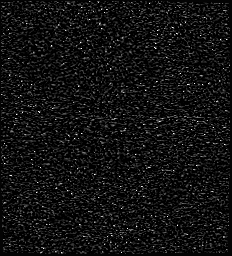

[Series 13: swi_images · axial · 3.0mm · 0.90mm/px · z∈[-96,+78]mm · 3 of 60 slices shown]
[im 1/60]
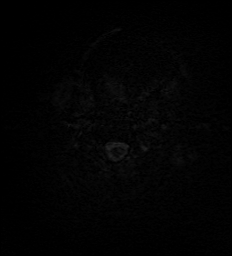
[im 30/60]
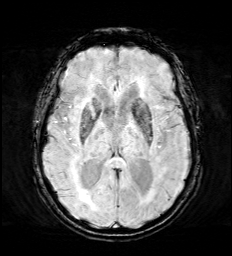
[im 60/60]
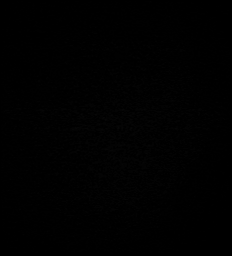

[Series 15: FLAIR · axial · 3.0mm · 0.53mm/px · z∈[-88,+70]mm · 3 of 55 slices shown]
[im 1/55]
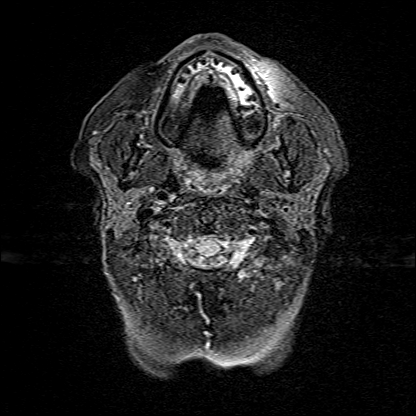
[im 28/55]
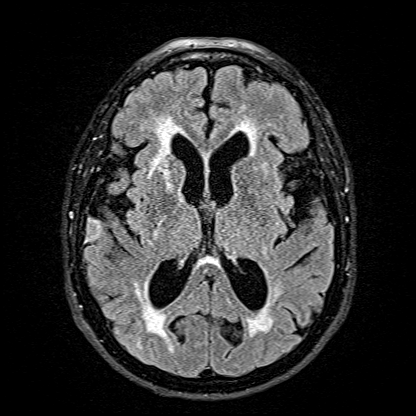
[im 55/55]
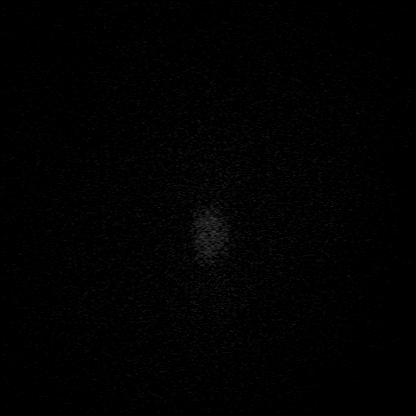

[Series 16: T1 · axial · 1.0mm · 0.98mm/px · z∈[-92,+79]mm · 8 of 176 slices shown (2 of 2)]
[im 1/176]
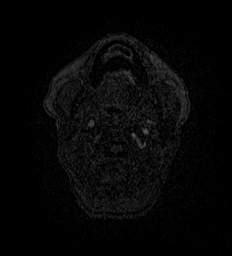
[im 20/176]
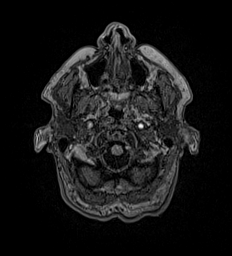
[im 59/176]
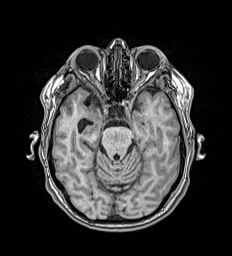
[im 78/176]
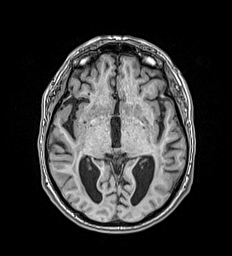
[im 98/176]
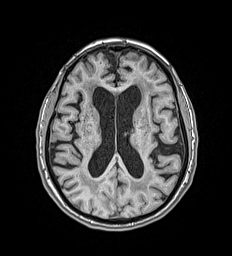
[im 117/176]
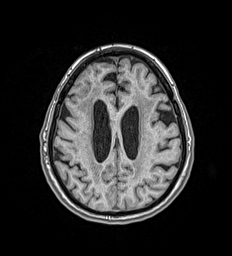
[im 156/176]
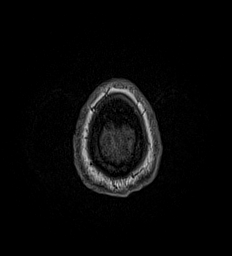
[im 176/176]
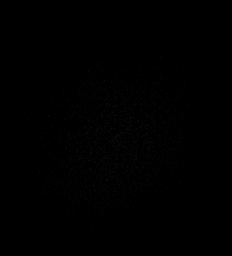

[Series 17: T2 post-contrast · coronal · 5.0mm · 0.57mm/px · 2 of 29 slices shown]
[im 1/29]
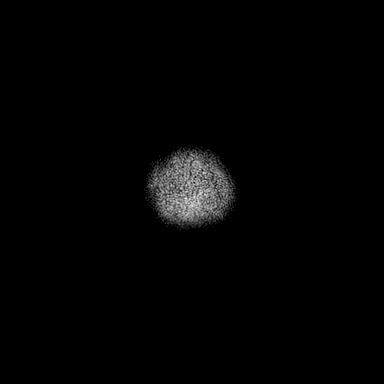
[im 29/29]
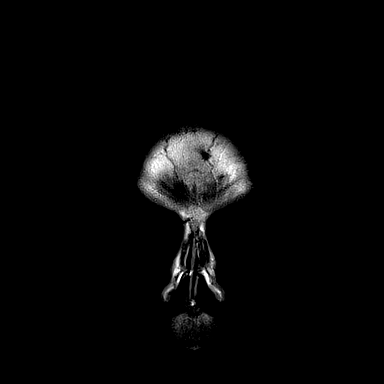

[Series 18: T1 post-contrast · axial · 1.0mm · 0.98mm/px · z∈[-92,+79]mm · 8 of 176 slices shown (1 of 3)]
[im 1/176]
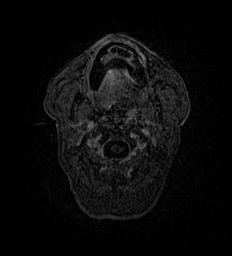
[im 20/176]
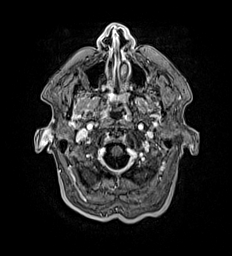
[im 59/176]
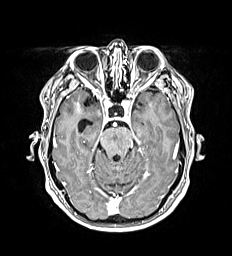
[im 78/176]
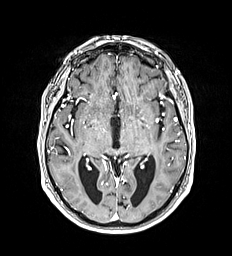
[im 98/176]
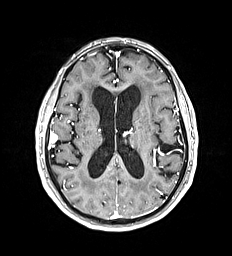
[im 117/176]
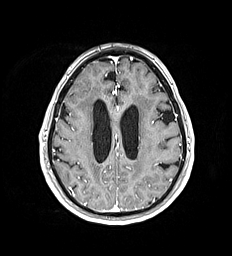
[im 156/176]
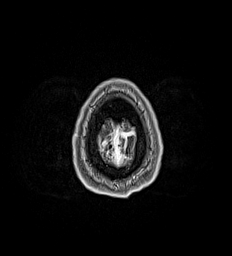
[im 176/176]
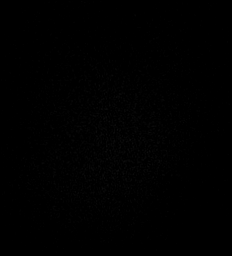

[Series 19: T1 post-contrast · coronal · 5.0mm · 0.57mm/px · 2 of 29 slices shown (2 of 3)]
[im 1/29]
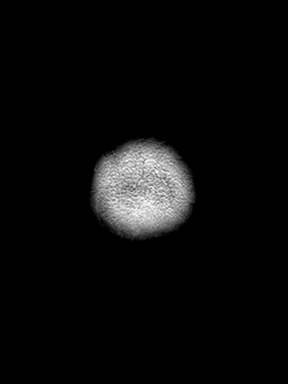
[im 29/29]
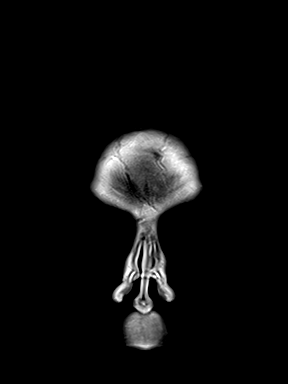

[Series 20: T1 post-contrast · sagittal · 5.0mm · 0.62mm/px · 1 of 21 slices shown (3 of 3)]
[im 1/21]
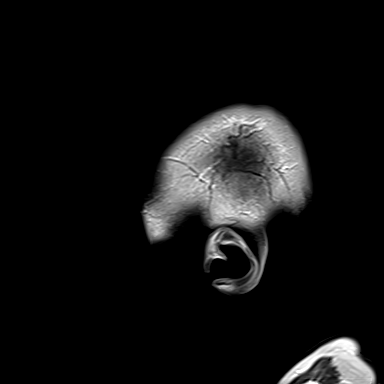

[44 of 48 positions shown; findings below may reference images not displayed]

FINDINGS: Brain:

Stable, mild generalized parenchymal atrophy.

There is a homogeneously enhancing dural-based mass overlying the
right frontotemporal operculum with associated dural tail consistent
with meningioma. The mass measures 1.3 x 1.1 x 1.4 cm. This mass was
not present on prior MRI 10/11/2006. There is minimal mass effect
upon the underlying right temporal lobe without parenchymal edema.

Moderate to advanced patchy and confluent T2/FLAIR hyperintensity
within the cerebral white matter is nonspecific, but consistent with
chronic small vessel ischemic disease. Findings have progressed from
the prior MRI. Chronic lacunar infarcts within the bilateral basal
ganglia have also progressed. To a lesser degree, chronic small
vessel ischemic changes are present within the pons.

There is no acute infarct.

No chronic intracranial blood products.

No extra-axial fluid collection.

No midline shift.

Vascular: Expected proximal arterial flow voids.

Skull and upper cervical spine: No focal marrow lesion.

Sinuses/Orbits: Visualized orbits show no acute finding. No
significant paranasal sinus disease or mastoid effusion at the
imaged levels
IMPRESSION: 1.4 cm extra-axial dural-based mass overlying the right
frontotemporal operculum consistent with meningioma. This mass was
not present on the prior MRI of 10/11/2006.

Progressive moderate to advanced chronic small vessel ischemic
changes. Additionally, chronic lacunar infarcts within the bilateral
basal ganglia have progressed as compared to the prior MRI.

Stable, mild generalized parenchymal atrophy.

## 2020-11-20 ENCOUNTER — Other Ambulatory Visit: Payer: Self-pay

## 2020-11-20 ENCOUNTER — Ambulatory Visit
Admission: RE | Admit: 2020-11-20 | Discharge: 2020-11-20 | Disposition: A | Discharge status: Home / Self Care | Payer: Medicare Other | Source: Ambulatory Visit | Attending: Family Medicine | Admitting: Family Medicine

## 2020-11-20 ENCOUNTER — Other Ambulatory Visit: Payer: Self-pay | Admitting: Family Medicine

## 2020-11-20 DIAGNOSIS — Z780 Asymptomatic menopausal state: Secondary | ICD-10-CM | POA: Diagnosis not present

## 2020-11-20 DIAGNOSIS — Z1231 Encounter for screening mammogram for malignant neoplasm of breast: Secondary | ICD-10-CM

## 2021-03-27 IMAGING — MG DIGITAL SCREENING BREAST BILAT IMPLANT W/ TOMO W/ CAD
8 of 16 series · 8 of 40 positions shown · non-contrast
Comparison: Previous exam(s).

ACR Breast Density Category a: The breast tissue is almost entirely
fatty.

CLINICAL DATA: Screening.

EXAM:
DIGITAL SCREENING BILATERAL MAMMOGRAM WITH IMPLANTS, CAD AND TOMO
The patient has retropectoral implants. Standard and implant
displaced views were performed.

[L MLO]
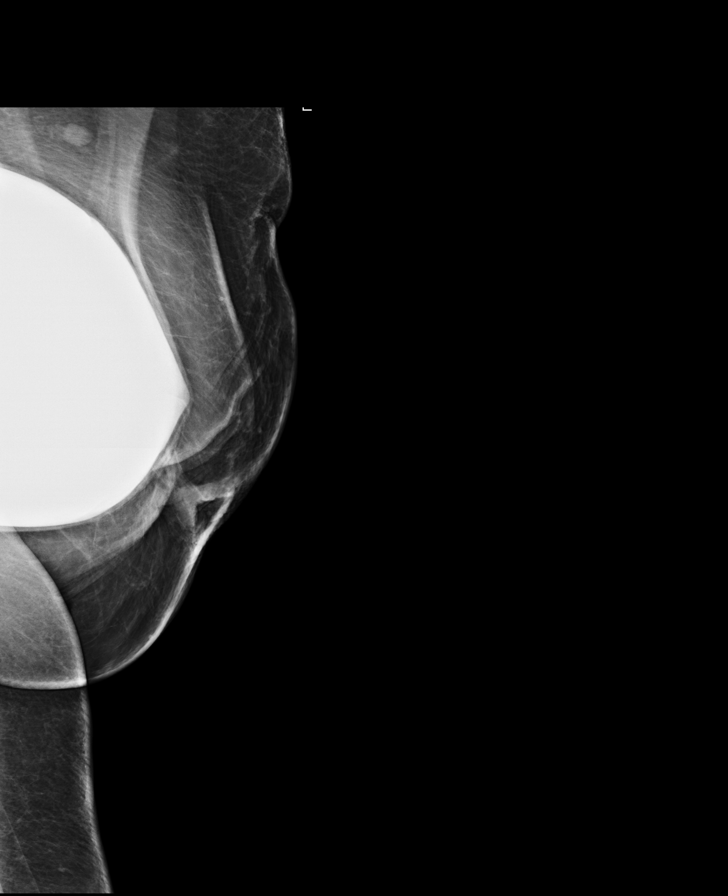

[R CC]
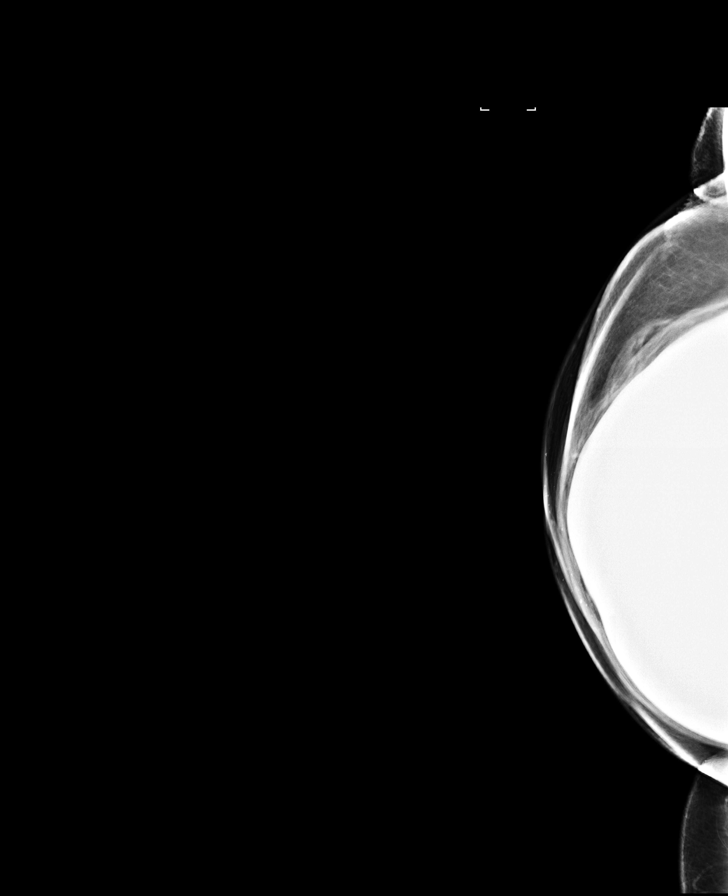

[R MLO]
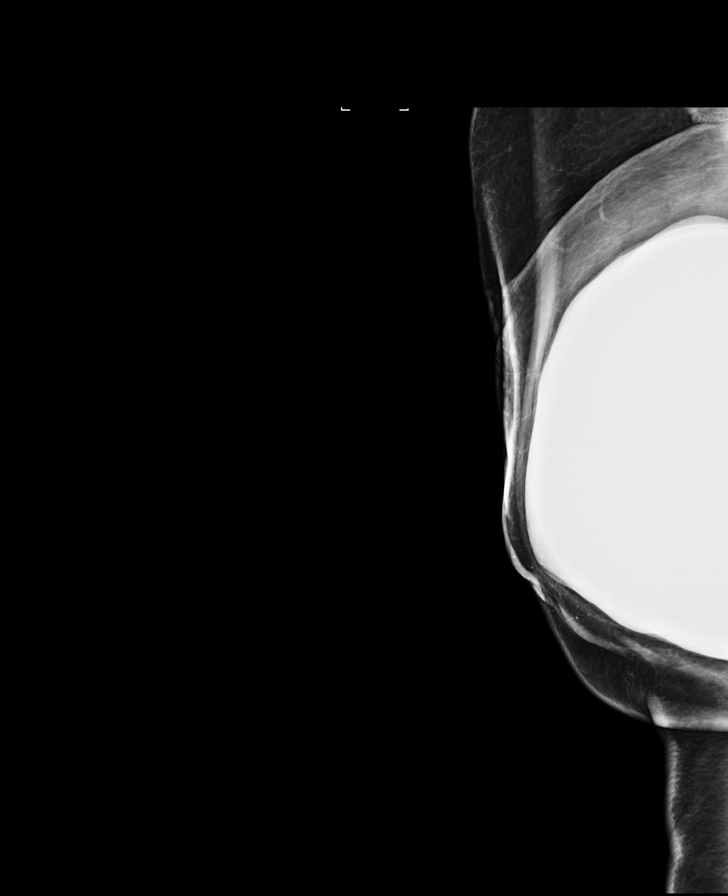

[L CC]
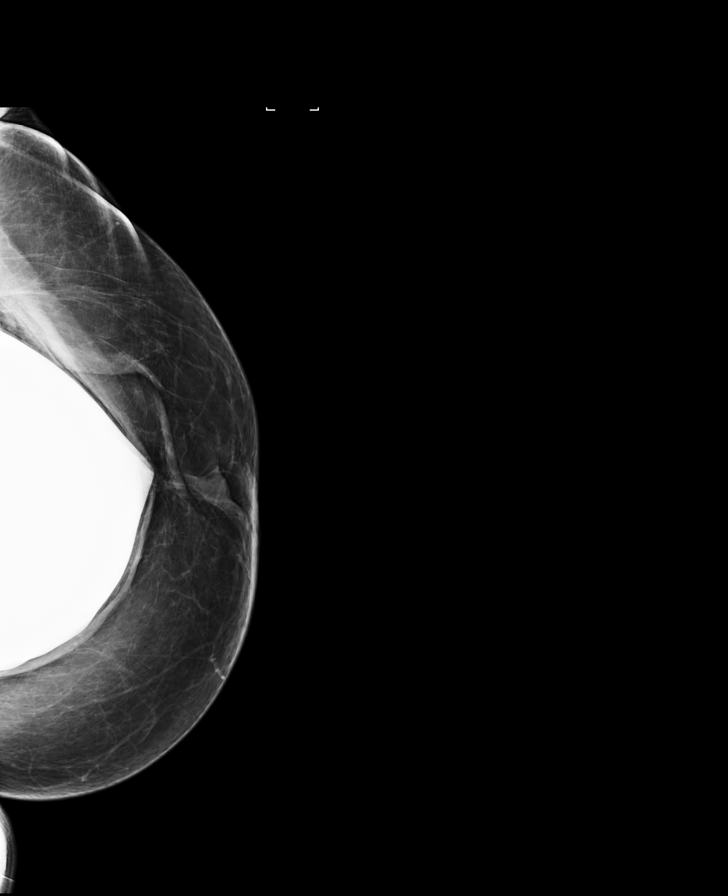

[R MLO synth-2D]
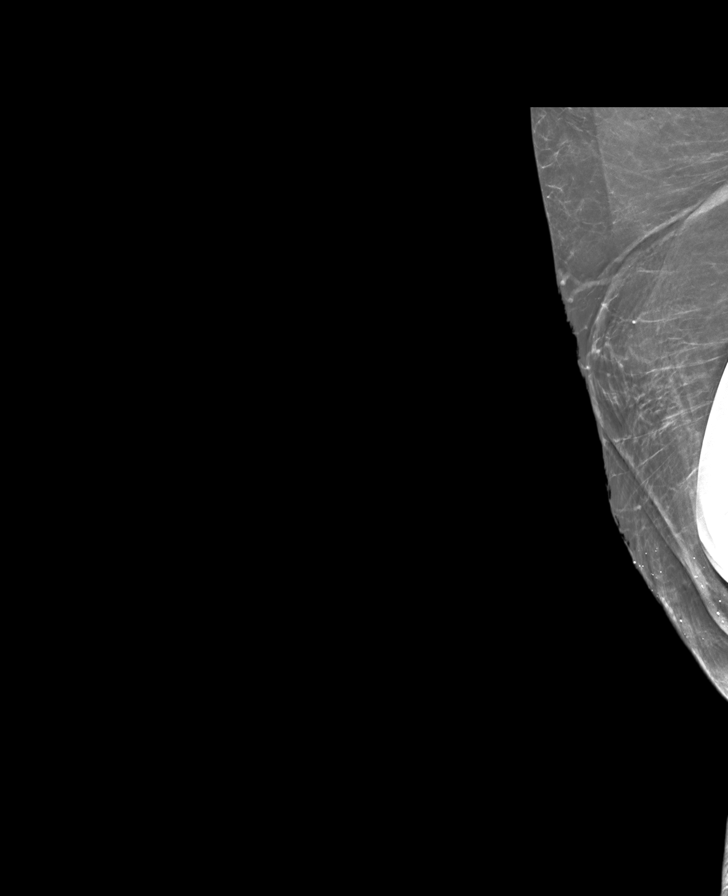

[L CC synth-2D]
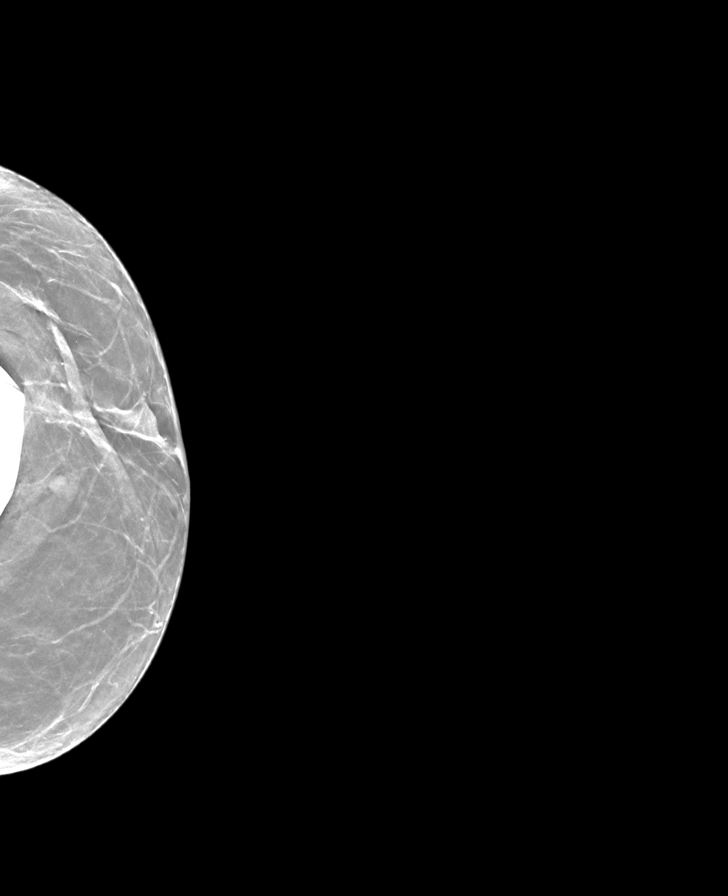

[R CC synth-2D]
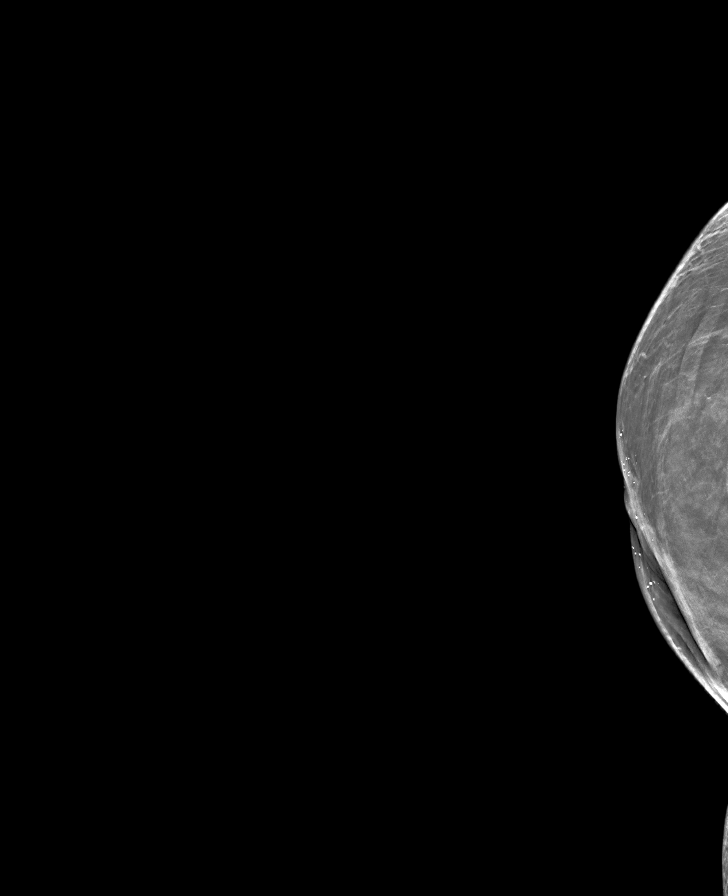

[L MLO synth-2D]
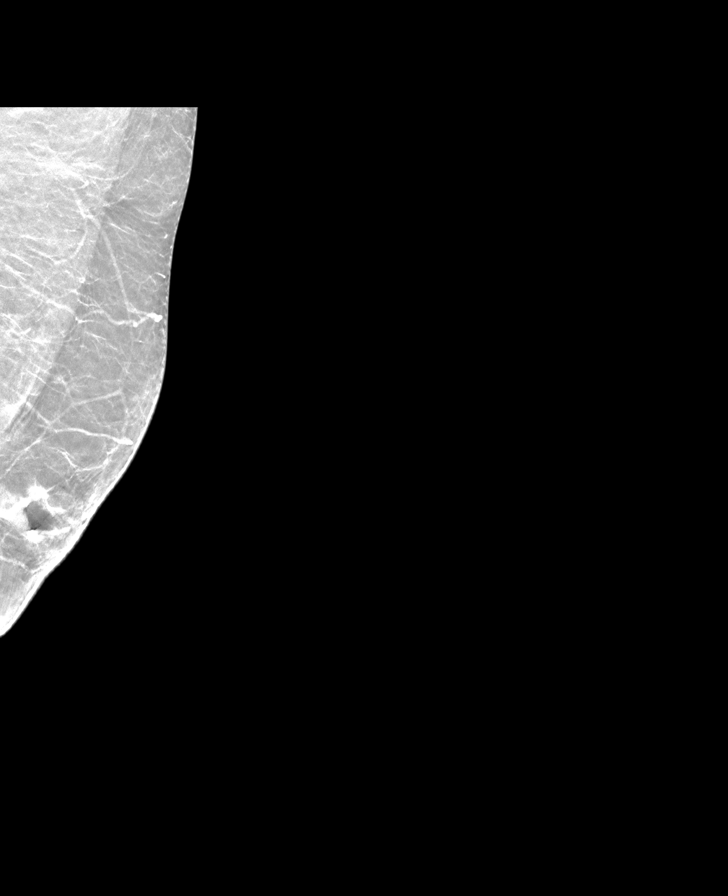

[8 of 40 positions shown; findings below may reference images not displayed]

FINDINGS: There are no findings suspicious for malignancy. Images were
processed with CAD.
IMPRESSION: No mammographic evidence of malignancy. A result letter of this
screening mammogram will be mailed directly to the patient.

RECOMMENDATION:
Screening mammogram in one year. (Code:[facility]-E-JAA)

BI-RADS CATEGORY  1:  Negative.

## 2021-05-24 ENCOUNTER — Other Ambulatory Visit: Payer: Self-pay | Admitting: Family Medicine

## 2021-05-24 DIAGNOSIS — Z780 Asymptomatic menopausal state: Secondary | ICD-10-CM

## 2021-05-24 DIAGNOSIS — Z1231 Encounter for screening mammogram for malignant neoplasm of breast: Secondary | ICD-10-CM

## 2021-06-05 ENCOUNTER — Encounter: Payer: Self-pay | Admitting: General Surgery

## 2021-06-06 ENCOUNTER — Ambulatory Visit
Admission: RE | Admit: 2021-06-06 | Discharge: 2021-06-06 | Disposition: A | Discharge status: Home / Self Care | Payer: Medicare Other | Attending: General Surgery | Admitting: General Surgery

## 2021-06-06 ENCOUNTER — Encounter
Admission: RE | Disposition: A | Discharge status: Home / Self Care | Payer: Self-pay | Source: Home / Self Care | Attending: General Surgery

## 2021-06-06 ENCOUNTER — Ambulatory Visit: Payer: Medicare Other | Admitting: Certified Registered"

## 2021-06-06 ENCOUNTER — Encounter: Payer: Self-pay | Admitting: General Surgery

## 2021-06-06 DIAGNOSIS — Z91013 Allergy to seafood: Secondary | ICD-10-CM | POA: Diagnosis not present

## 2021-06-06 DIAGNOSIS — Z882 Allergy status to sulfonamides status: Secondary | ICD-10-CM | POA: Diagnosis not present

## 2021-06-06 DIAGNOSIS — C9110 Chronic lymphocytic leukemia of B-cell type not having achieved remission: Secondary | ICD-10-CM | POA: Diagnosis not present

## 2021-06-06 DIAGNOSIS — E119 Type 2 diabetes mellitus without complications: Secondary | ICD-10-CM | POA: Insufficient documentation

## 2021-06-06 DIAGNOSIS — K219 Gastro-esophageal reflux disease without esophagitis: Secondary | ICD-10-CM | POA: Diagnosis not present

## 2021-06-06 DIAGNOSIS — Z888 Allergy status to other drugs, medicaments and biological substances status: Secondary | ICD-10-CM | POA: Insufficient documentation

## 2021-06-06 DIAGNOSIS — Z7982 Long term (current) use of aspirin: Secondary | ICD-10-CM | POA: Insufficient documentation

## 2021-06-06 DIAGNOSIS — K222 Esophageal obstruction: Secondary | ICD-10-CM | POA: Insufficient documentation

## 2021-06-06 DIAGNOSIS — Z87891 Personal history of nicotine dependence: Secondary | ICD-10-CM | POA: Diagnosis not present

## 2021-06-06 DIAGNOSIS — Z79899 Other long term (current) drug therapy: Secondary | ICD-10-CM | POA: Diagnosis not present

## 2021-06-06 DIAGNOSIS — R1310 Dysphagia, unspecified: Secondary | ICD-10-CM | POA: Insufficient documentation

## 2021-06-06 HISTORY — PX: ESOPHAGOGASTRODUODENOSCOPY: SHX5428

## 2021-06-06 LAB — GLUCOSE, CAPILLARY: Glucose-Capillary: 120 mg/dL — ABNORMAL HIGH (ref 70–99)

## 2021-06-06 SURGERY — EGD (ESOPHAGOGASTRODUODENOSCOPY)
Anesthesia: General

## 2021-06-06 MED ORDER — LIDOCAINE HCL (CARDIAC) PF 100 MG/5ML IV SOSY
PREFILLED_SYRINGE | INTRAVENOUS | Status: DC | PRN
  Administered 2021-06-06: 50 mg via INTRAVENOUS

## 2021-06-06 MED ORDER — PROPOFOL 10 MG/ML IV BOLUS
INTRAVENOUS | Status: DC | PRN
  Administered 2021-06-06 (×4): 20 mg via INTRAVENOUS
  Administered 2021-06-06: 10 mg via INTRAVENOUS
  Administered 2021-06-06: 50 mg via INTRAVENOUS

## 2021-06-06 MED ORDER — SODIUM CHLORIDE 0.9 % IV SOLN
INTRAVENOUS | Status: DC
  Administered 2021-06-06: 1000 mL via INTRAVENOUS

## 2021-06-06 MED ORDER — SUCRALFATE 1 GM/10ML PO SUSP
1.0000 g | Freq: Once | ORAL | Status: AC
  Administered 2021-06-06: 1 g via ORAL
  Filled 2021-06-06: qty 10

## 2021-06-06 MED ORDER — PROPOFOL 10 MG/ML IV BOLUS
INTRAVENOUS | Status: AC
  Filled 2021-06-06: qty 20

## 2021-06-06 NOTE — H&P (Signed)
Melissa Curtis [id number] 1941-10-01     HPI:  80 y/o woman with a year with intermittent dysphagia.  Prior dilatation in the distant past.   Occasional need to regurgitate food (without mucous).  Occasional just slow passage.   Medications Prior to Admission  Medication Sig Dispense Refill Last Dose   aspirin EC 81 MG tablet Take 1 tablet by mouth daily.   06/05/2021   Calcium Carbonate-Vitamin D 600-400 MG-UNIT tablet Take 1 tablet by mouth 2 (two) times daily.   06/05/2021   fexofenadine (ALLEGRA) 180 MG tablet Take 180 mg by mouth daily as needed.   Past Month   fluticasone (FLONASE) 50 MCG/ACT nasal spray Place 1 spray into both nostrils daily.   Past Month   glucose blood test strip    06/06/2021   lisinopril (PRINIVIL,ZESTRIL) 2.5 MG tablet Take 1 tablet by mouth daily.   06/05/2021   montelukast (SINGULAIR) 10 MG tablet Take by mouth.   06/05/2021   Multiple Vitamins-Minerals (MULTIVITAMIN GUMMIES ADULT PO) Take 1 each by mouth daily.   06/05/2021   NEOMYCIN-POLYMYXIN-HYDROCORTISONE (CORTISPORIN) 1 % SOLN OTIC solution Apply 1-2 drops to toe BID after soaking 10 mL 1 Past Month   omeprazole (PRILOSEC) 20 MG capsule Take 20 mg by mouth daily.   06/05/2021   pravastatin (PRAVACHOL) 10 MG tablet Take 1 tablet by mouth at bedtime.   06/05/2021   traZODone (DESYREL) 50 MG tablet Take 1 tablet by mouth at bedtime.   06/05/2021   venlafaxine (EFFEXOR) 75 MG tablet Take 75 mg by mouth daily.   06/05/2021   Allergies  Allergen Reactions   Statins Other (See Comments)    Could not tolerate Liptor due to aching legs and joints; However, she has been able to tolerate pravachol   Iodine    Shellfish Allergy    Sulfa Antibiotics    Past Medical History:  Diagnosis Date   Breast cancer ([place]) 1983   had double mastectomy   Cataracts, bilateral    CLL (chronic lymphocytic leukemia) ([facility])    Diabetes ([place])    GERD (gastroesophageal reflux disease)    Hypertension    Leukocytosis 01/2014   Osteopenia     Past Surgical History:  Procedure Laterality Date   APPENDECTOMY     AUGMENTATION MAMMAPLASTY Bilateral 1983   COLONOSCOPY     by Dr. Vira Agar over 5 years ago   double mastectomy with breast rescontruction  [place] Bilateral 1983   right breast ca mastecomy only no chemo no rad bilat implants    Social History   Socioeconomic History   Marital status: Married    Spouse name: Not on file   Number of children: Not on file   Years of education: Not on file   Highest education level: Not on file  Occupational History   Not on file  Tobacco Use   Smoking status: Former    Pack years: 0.00    Types: Cigarettes    Quit date: 12/03/1995    Years since quitting: 25.5   Smokeless tobacco: Never  Vaping Use   Vaping Use: Never used  Substance and Sexual Activity   Alcohol use: No    Alcohol/week: 0.0 standard drinks   Drug use: No   Sexual activity: Yes    Partners: Male  Other Topics Concern   Not on file  Social History Narrative   Not on file   Social Determinants of Health   Financial Resource Strain: Not on  file  Food Insecurity: Not on file  Transportation Needs: Not on file  Physical Activity: Not on file  Stress: Not on file  Social Connections: Not on file  Intimate Partner Violence: Not on file   Social History   Social History Narrative   Not on file     ROS: Negative.     PE: HEENT: Negative. Lungs: Clear. Cardio: RR.    Assessment/Plan:  Proceed with planned endoscopy. Possible dilatation.    Forest Gleason [facility] 06/06/2021

## 2021-06-06 NOTE — Op Note (Signed)
[facility] Gastroenterology Patient Name: Melissa Curtis Procedure Date: 06/06/2021 1:02 PM MRN: [id number] Account #: [id number] Date of Birth: [birth year] Admit Type: Outpatient Age: 80 Room: [facility] ENDO ROOM 1 Gender: Female Note Status: Finalized Procedure:             Upper GI endoscopy Indications:           Dysphagia Providers:             Robert Bellow, MD Referring MD:          Rubbie Battiest. Iona Beard MD, MD (Referring MD) Medicines:             Propofol per Anesthesia Complications:         No immediate complications. Procedure:             Pre-Anesthesia Assessment:                        - Prior to the procedure, a History and Physical was                         performed, and patient medications, allergies and                         sensitivities were reviewed. The patient's tolerance                         of previous anesthesia was reviewed.                        - The risks and benefits of the procedure and the                         sedation options and risks were discussed with the                         patient. All questions were answered and informed                         consent was obtained.                        After obtaining informed consent, the endoscope was                         passed under direct vision. Throughout the procedure,                         the patient's blood pressure, pulse, and oxygen                         saturations were monitored continuously. The Endoscope                         was introduced through the mouth, and advanced to the                         second part of duodenum. The upper GI endoscopy was  accomplished without difficulty. The patient tolerated                         the procedure well. Findings:      One benign-appearing, intrinsic moderate stenosis was found. This       stenosis measured 1 cm (inner diameter) x less than one cm (in length).       The stenosis  was traversed. The dilation site was examined and showed       mild mucosal disruption and complete resolution of luminal narrowing.       Estimated blood loss was minimal. A small hiatal hernia was noted.      The stomach was normal.      The examined duodenum was normal. Impression:            - Benign-appearing esophageal stenosis.                        - Normal examined duodenum.                        - No specimens collected. Recommendation:        - Discharge patient to home (via wheelchair).                        - Soft diet today. Procedure Code(s):     --- Professional ---                        [phone], Esophagogastroduodenoscopy, flexible,                         transoral; diagnostic, including collection of                         specimen(s) by brushing or washing, when performed                         (separate procedure) Diagnosis Code(s):     --- Professional ---                        K22.2, Esophageal obstruction                        R13.10, Dysphagia, unspecified CPT copyright 2019 American Medical Association. All rights reserved. The codes documented in this report are preliminary and upon coder review may  be revised to meet current compliance requirements. Robert Bellow, MD 06/06/2021 1:35:53 PM This report has been signed electronically. Number of Addenda: 0 Note Initiated On: 06/06/2021 1:02 PM Estimated Blood Loss:  Estimated blood loss was minimal.      [facility]

## 2021-06-06 NOTE — Transfer of Care (Signed)
Immediate Anesthesia Transfer of Care Note  Patient: ZULA HOVSEPIAN  Procedure(s) Performed: ESOPHAGOGASTRODUODENOSCOPY (EGD)  Patient Location: PACU and Endoscopy Unit  Anesthesia Type:General  Level of Consciousness: drowsy and patient cooperative  Airway & Oxygen Therapy: Patient Spontanous Breathing  Post-op Assessment: Report given to RN and Post -op Vital signs reviewed and stable  Post vital signs: Reviewed and stable  Last Vitals:  Vitals Value Taken Time  BP 142/65 06/06/21 1333  Temp    Pulse 55 06/06/21 1334  Resp 10 06/06/21 1334  SpO2 97 % 06/06/21 1334  Vitals shown include unvalidated device data.  Last Pain:  Vitals:   06/06/21 1108  TempSrc: Temporal  PainSc: 0-No pain         Complications: No notable events documented.

## 2021-06-06 NOTE — Anesthesia Postprocedure Evaluation (Signed)
Anesthesia Post Note  Patient: Melissa Curtis  Procedure(s) Performed: ESOPHAGOGASTRODUODENOSCOPY (EGD)  Patient location during evaluation: Endoscopy Anesthesia Type: General Level of consciousness: awake and alert Pain management: pain level controlled Vital Signs Assessment: post-procedure vital signs reviewed and stable Respiratory status: spontaneous breathing, nonlabored ventilation, respiratory function stable and patient connected to nasal cannula oxygen Cardiovascular status: blood pressure returned to baseline and stable Postop Assessment: no apparent nausea or vomiting Anesthetic complications: no   No notable events documented.   Last Vitals:  Vitals:   06/06/21 1354 06/06/21 1404  BP: (!) 161/74 (!) 156/67  Pulse: (!) 56 (!) 57  Resp: 13 17  Temp:    SpO2: 99% 99%    Last Pain:  Vitals:   06/06/21 1404  TempSrc:   PainSc: 0-No pain                 Martha Clan

## 2021-06-06 NOTE — Anesthesia Preprocedure Evaluation (Signed)
Anesthesia Evaluation  Patient identified by MRN, date of birth, ID band Patient awake    Reviewed: Allergy & Precautions, H&P , NPO status , Patient's Chart, lab work & pertinent test results, reviewed documented beta blocker date and time   History of Anesthesia Complications Negative for: history of anesthetic complications  Airway Mallampati: II  TM Distance: >3 FB Neck ROM: full    Dental  (+) Dental Advidsory Given, Caps, Upper Dentures, Edentulous Upper   Pulmonary neg pulmonary ROS, former smoker,    Pulmonary exam normal breath sounds clear to auscultation       Cardiovascular Exercise Tolerance: Good hypertension, (-) angina+ CAD  (-) Past MI and (-) Cardiac Stents Normal cardiovascular exam(-) dysrhythmias + Valvular Problems/Murmurs  Rhythm:regular Rate:Normal     Neuro/Psych negative neurological ROS  negative psych ROS   GI/Hepatic Neg liver ROS, GERD  ,  Endo/Other  diabetes  Renal/GU negative Renal ROS  negative genitourinary   Musculoskeletal   Abdominal   Peds  Hematology negative hematology ROS (+)   Anesthesia Other Findings Past Medical History: 1983: Breast cancer ([place])     Comment:  had double mastectomy No date: Cataracts, bilateral No date: CLL (chronic lymphocytic leukemia) ([facility]CC) No date: Diabetes ([place]) No date: GERD (gastroesophageal reflux disease) No date: Hypertension 01/2014: Leukocytosis No date: Osteopenia   Reproductive/Obstetrics negative OB ROS                             Anesthesia Physical Anesthesia Plan  ASA: 2  Anesthesia Plan: General   Post-op Pain Management:    Induction: Intravenous  PONV Risk Score and Plan: 3 and TIVA and Propofol infusion  Airway Management Planned: Natural Airway and Nasal Cannula  Additional Equipment:   Intra-op Plan:   Post-operative Plan:   Informed Consent: I have reviewed the patients History  and Physical, chart, labs and discussed the procedure including the risks, benefits and alternatives for the proposed anesthesia with the patient or authorized representative who has indicated his/her understanding and acceptance.     Dental Advisory Given  Plan Discussed with: Anesthesiologist, CRNA and Surgeon  Anesthesia Plan Comments:         Anesthesia Quick Evaluation

## 2021-06-06 NOTE — Anesthesia Procedure Notes (Signed)
Procedure Name: MAC Date/Time: 06/06/2021 1:11 PM Performed by: Jerrye Noble, CRNA Pre-anesthesia Checklist: Patient identified, Emergency Drugs available, Suction available and Patient being monitored Patient Re-evaluated:Patient Re-evaluated prior to induction Oxygen Delivery Method: Nasal cannula

## 2021-06-07 ENCOUNTER — Encounter: Payer: Self-pay | Admitting: General Surgery

## 2021-09-03 ENCOUNTER — Other Ambulatory Visit: Payer: Self-pay

## 2021-09-03 ENCOUNTER — Ambulatory Visit (INDEPENDENT_AMBULATORY_CARE_PROVIDER_SITE_OTHER): Payer: Medicare Other | Admitting: Podiatry

## 2021-09-03 DIAGNOSIS — L600 Ingrowing nail: Secondary | ICD-10-CM

## 2021-09-04 NOTE — Progress Notes (Signed)
  Subjective:  Patient ID: Melissa Curtis, female    DOB: Jun 16, 1941,  MRN: [id number]  Chief Complaint  Patient presents with   Nail Problem     Right foot big toe-[facility] pt didnt want to wait for Dr Fuller Mandril appt.    80 y.o. female presents with the above complaint. History confirmed with patient.  She is having pain in the outside of the right hallux nail she is getting an ingrown, she has had this bruise in the left side, Dr. Milinda Pointer had treated this previously with partial permanent nail avulsion  Objective:  Physical Exam: warm, good capillary refill, no trophic changes or ulcerative lesions, normal DP and PT pulses, and normal sensory exam.  Right Foot: Ingrown toenail lateral border  Assessment:   1. Ingrowing right great toenail      Plan:  Patient was evaluated and treated and all questions answered.  Evaluated the ingrown nails mostly at the distal tip.  I debrided the nail in a slant back fashion.  I will see her back in a few weeks to reevaluate.  If it still bothersome we will plan for partial permanent nail avulsion at that time.  Otherwise will debride routinely  Return in about 3 weeks (around 09/24/2021) for nail re-check.

## 2021-09-26 ENCOUNTER — Ambulatory Visit (INDEPENDENT_AMBULATORY_CARE_PROVIDER_SITE_OTHER): Payer: Medicare Other | Admitting: Podiatry

## 2021-09-26 ENCOUNTER — Other Ambulatory Visit: Payer: Self-pay

## 2021-09-26 DIAGNOSIS — L600 Ingrowing nail: Secondary | ICD-10-CM | POA: Diagnosis not present

## 2021-09-27 NOTE — Progress Notes (Signed)
  Subjective:  Patient ID: Melissa Curtis, female    DOB: [birth year],  MRN: [id number]  Chief Complaint  Patient presents with   Ingrown Toenail    Nail check right great toenail    80 y.o. female returns for follow-up with the above complaint. History confirmed with patient.  Doing much better the procedure alleviated all the pressure Objective:  Physical Exam: warm, good capillary refill, no trophic changes or ulcerative lesions, normal DP and PT pulses, and normal sensory exam.  Right Foot: No current ingrown  Assessment:   1. Ingrowing right great toenail      Plan:  Patient was evaluated and treated and all questions answered.  Doing well after his performing a slant back.  I encouraged her to continue to monitor this and we can debride as needed when it bothers her.  She will return to see me or Dr. Milinda Pointer if this continues to worsen and she needs permanent partial nail avulsion  Return if symptoms worsen or fail to improve.

## 2022-03-26 ENCOUNTER — Ambulatory Visit
Admission: RE | Admit: 2022-03-26 | Discharge: 2022-03-26 | Disposition: A | Discharge status: Home / Self Care | Payer: Medicare Other | Source: Ambulatory Visit | Attending: Family Medicine | Admitting: Family Medicine

## 2022-03-26 DIAGNOSIS — Z1231 Encounter for screening mammogram for malignant neoplasm of breast: Secondary | ICD-10-CM | POA: Insufficient documentation

## 2022-05-09 ENCOUNTER — Other Ambulatory Visit: Payer: Self-pay | Admitting: Neurosurgery

## 2022-05-09 DIAGNOSIS — D329 Benign neoplasm of meninges, unspecified: Secondary | ICD-10-CM

## 2022-06-19 ENCOUNTER — Ambulatory Visit
Admission: RE | Admit: 2022-06-19 | Discharge: 2022-06-19 | Disposition: A | Discharge status: Home / Self Care | Payer: Medicare Other | Source: Ambulatory Visit | Attending: Neurosurgery | Admitting: Neurosurgery

## 2022-06-19 DIAGNOSIS — D329 Benign neoplasm of meninges, unspecified: Secondary | ICD-10-CM | POA: Insufficient documentation

## 2022-06-19 MED ORDER — GADOBUTROL 1 MMOL/ML IV SOLN
6.0000 mL | Freq: Once | INTRAVENOUS | Status: AC | PRN
  Administered 2022-06-19: 6 mL via INTRAVENOUS

## 2022-07-31 IMAGING — MG DIGITAL SCREENING BREAST BILAT IMPLANT W/ TOMO W/ CAD
8 of 14 series · 8 of 34 positions shown · non-contrast
Comparison: Previous exam(s).

CLINICAL DATA: Screening.

EXAM:
DIGITAL SCREENING BILATERAL MAMMOGRAM WITH IMPLANTS, CAD AND
TOMOSYNTHESIS
TECHNIQUE: Bilateral screening digital craniocaudal and mediolateral oblique
mammograms were obtained. Bilateral screening digital breast
tomosynthesis was performed. The images were evaluated with
computer-aided detection. Standard and/or implant displaced views
were performed.

[R CC]
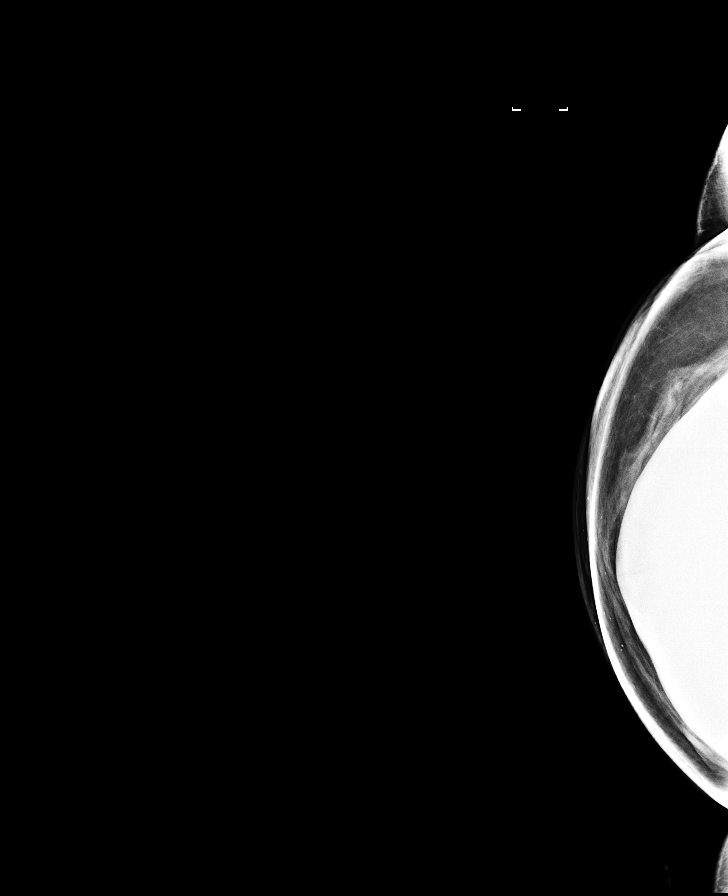

[L MLO]
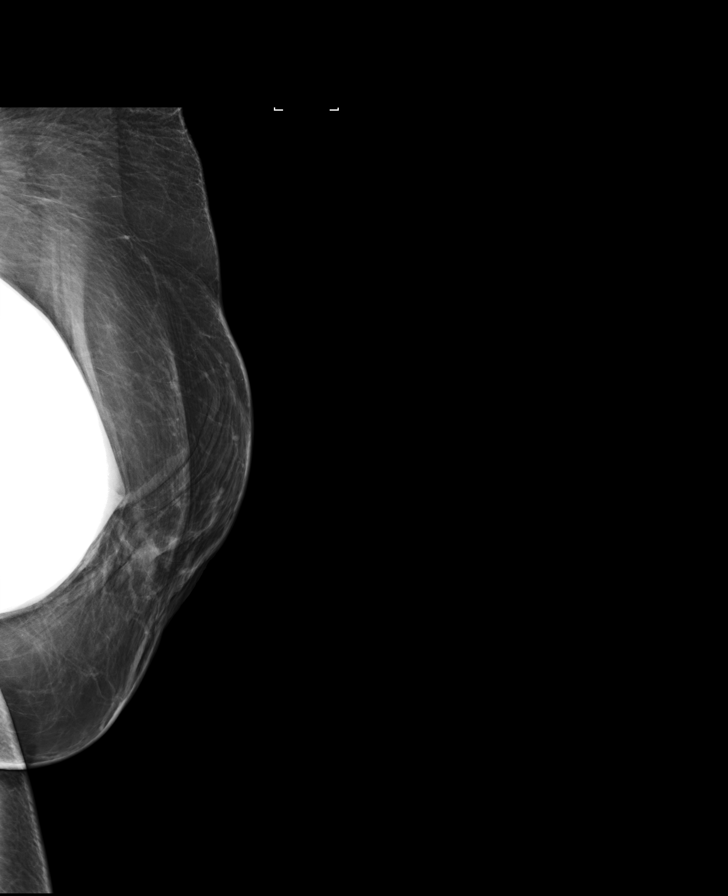

[L CC]
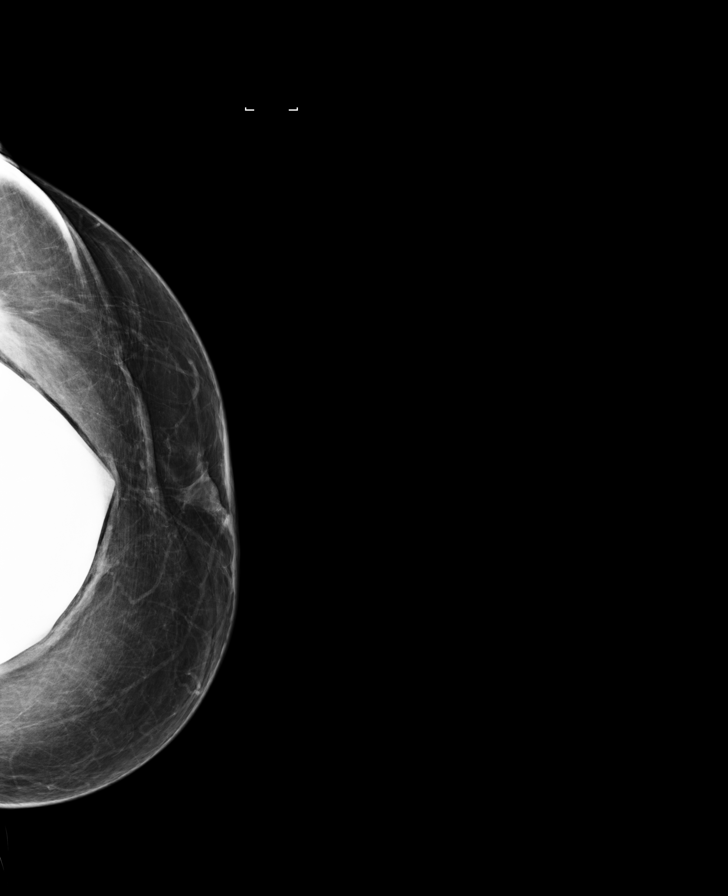

[R MLO]
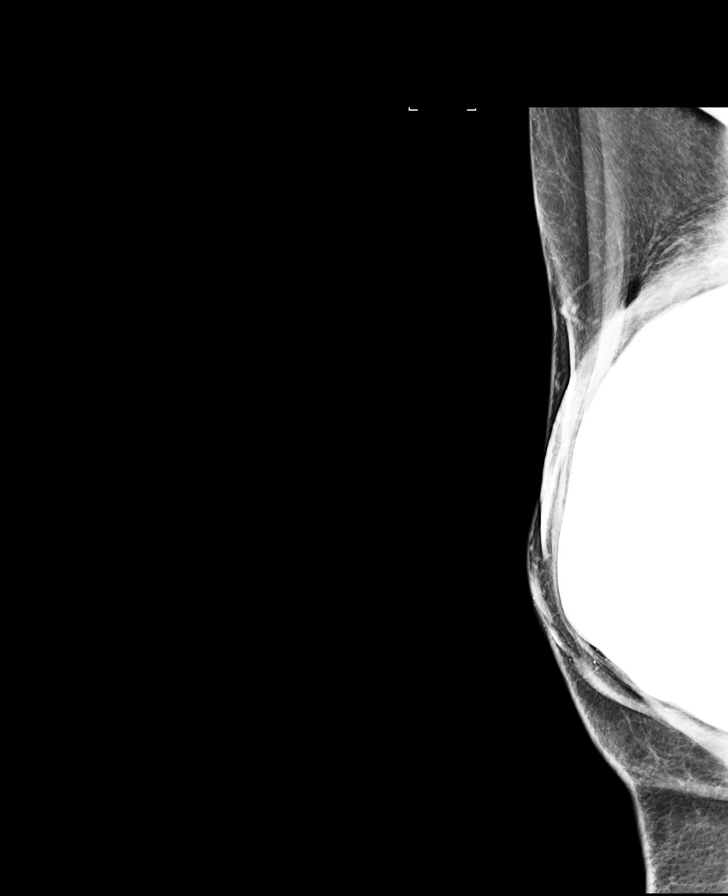

[L CC synth-2D]
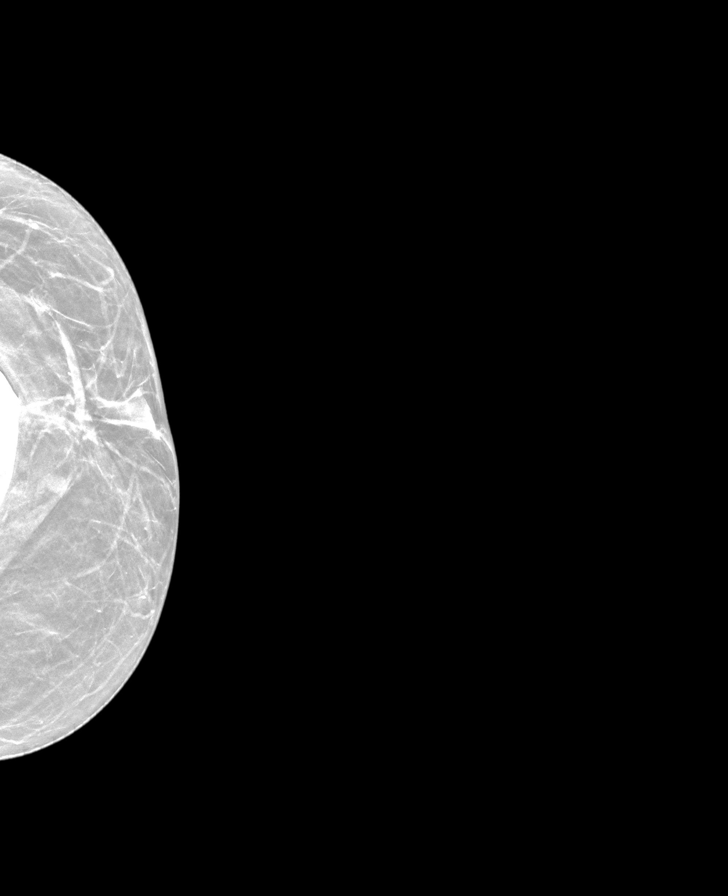

[R CC synth-2D]
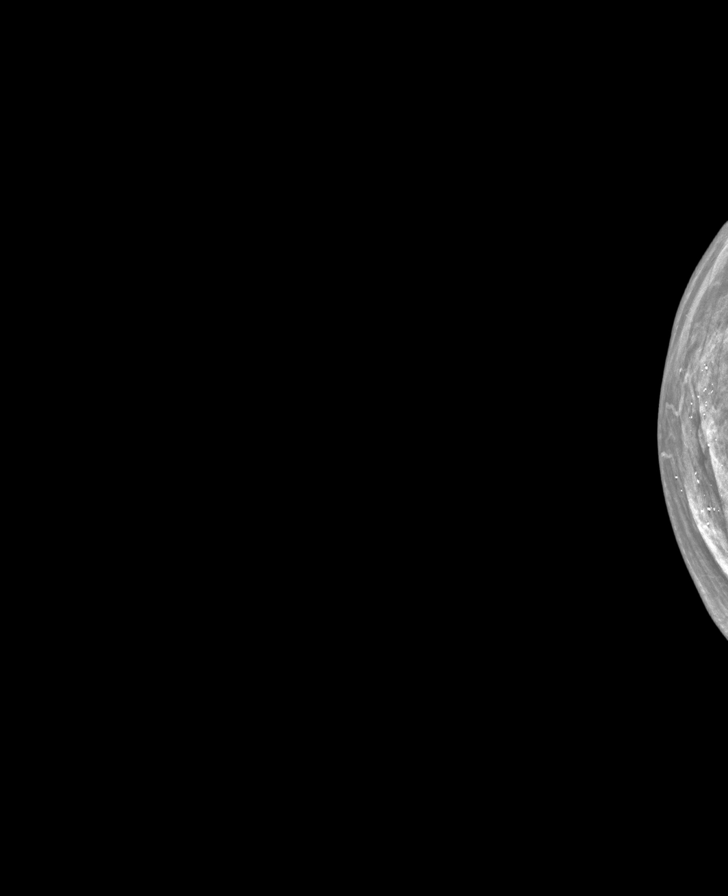

[L MLO synth-2D (1 of 2)]
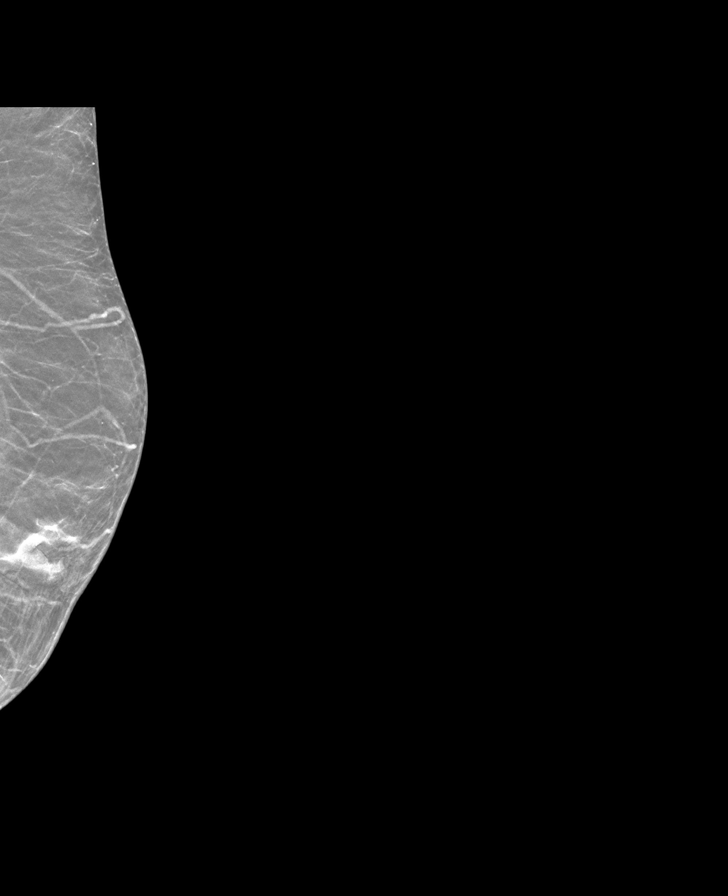

[L MLO synth-2D (2 of 2)]
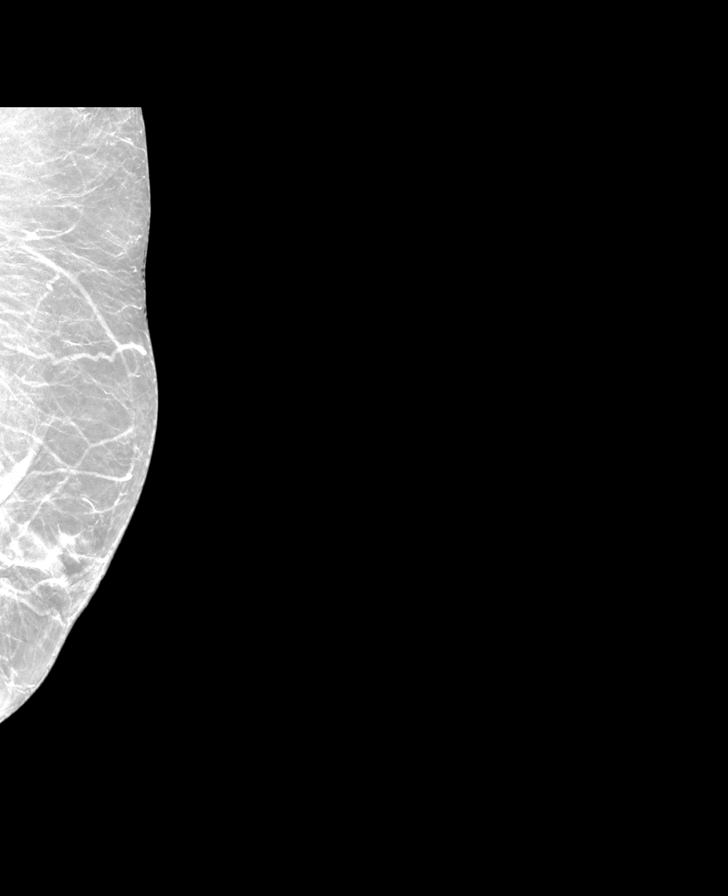

[8 of 34 positions shown; findings below may reference images not displayed]

ACR Breast Density Category b: There are scattered areas of
fibroglandular density.
FINDINGS: The patient has retropectoral implants. There are no findings
suspicious for malignancy.
IMPRESSION: No mammographic evidence of malignancy. A result letter of this
screening mammogram will be mailed directly to the patient.

RECOMMENDATION:
Screening mammogram in one year. (Code:SE-S-JMG)

BI-RADS CATEGORY  1:  Negative.

## 2022-11-29 ENCOUNTER — Other Ambulatory Visit: Payer: Self-pay | Admitting: Family Medicine

## 2022-11-29 DIAGNOSIS — R519 Headache, unspecified: Secondary | ICD-10-CM

## 2022-12-03 ENCOUNTER — Ambulatory Visit
Admission: RE | Admit: 2022-12-03 | Discharge: 2022-12-03 | Disposition: A | Discharge status: Home / Self Care | Payer: Medicare Other | Source: Ambulatory Visit | Attending: Family Medicine | Admitting: Family Medicine

## 2022-12-03 DIAGNOSIS — R519 Headache, unspecified: Secondary | ICD-10-CM | POA: Insufficient documentation

## 2023-03-11 ENCOUNTER — Other Ambulatory Visit: Payer: Self-pay | Admitting: Family Medicine

## 2023-03-11 DIAGNOSIS — Z1231 Encounter for screening mammogram for malignant neoplasm of breast: Secondary | ICD-10-CM

## 2023-03-24 DIAGNOSIS — I38 Endocarditis, valve unspecified: Secondary | ICD-10-CM | POA: Insufficient documentation

## 2023-03-24 DIAGNOSIS — I77810 Thoracic aortic ectasia: Secondary | ICD-10-CM | POA: Insufficient documentation

## 2023-04-02 ENCOUNTER — Ambulatory Visit
Admission: RE | Admit: 2023-04-02 | Discharge: 2023-04-02 | Disposition: A | Discharge status: Home / Self Care | Payer: Medicare Other | Source: Ambulatory Visit | Attending: Family Medicine | Admitting: Family Medicine

## 2023-04-02 DIAGNOSIS — Z1231 Encounter for screening mammogram for malignant neoplasm of breast: Secondary | ICD-10-CM

## 2023-07-22 ENCOUNTER — Inpatient Hospital Stay
Admission: RE | Admit: 2023-07-22 | Discharge: 2023-07-22 | Disposition: A | Discharge status: Home / Self Care | Payer: Self-pay | Source: Ambulatory Visit | Attending: Orthopedic Surgery | Admitting: Orthopedic Surgery

## 2023-07-22 ENCOUNTER — Other Ambulatory Visit: Payer: Self-pay | Admitting: Family Medicine

## 2023-07-22 DIAGNOSIS — Z049 Encounter for examination and observation for unspecified reason: Secondary | ICD-10-CM

## 2023-07-25 NOTE — Progress Notes (Unsigned)
Referring Physician:  No referring provider defined for this encounter.  Primary Physician:  Rayetta Humphrey, MD  History of Present Illness: 07/30/2023 Ms. Melissa Curtis has a history of HTN, GERD, DM, osteopenia, breast CA, CLL.   Dr. Adriana Simas has been following her for a meningioma that appeared stable when last MRI was done 06/19/22. He recommended follow up MRI in 2 years.   She's had mild dizziness since 2021, but it is getting worse.  About a week ago, she bent down to pick something in the yard, stood up, got dizzy, and fell fracturing her coccyx. Since that time, she notes intermittent dizziness that is her baseline.   She had her vision checked in April and was told she had 20/20 vision. No vision changes but she feels like her eyes get "tired." She has a history of dry eyes.   She has no weakness, numbness, headaches, visual changes, or speech changes.   Review of Systems:  A 10 point review of systems is negative, except for the pertinent positives and negatives detailed in the HPI.  Past Medical History: Past Medical History:  Diagnosis Date   Breast cancer (HCC) 1983   had double mastectomy   Cataracts, bilateral    CLL (chronic lymphocytic leukemia) (HCC)    Diabetes (HCC)    GERD (gastroesophageal reflux disease)    Hypertension    Leukocytosis 01/2014   Osteopenia     Past Surgical History: Past Surgical History:  Procedure Laterality Date   APPENDECTOMY     AUGMENTATION MAMMAPLASTY Bilateral 1983   COLONOSCOPY     by Dr. Mechele Collin over 5 years ago   double mastectomy with breast rescontruction  1983   ESOPHAGOGASTRODUODENOSCOPY N/A 06/06/2021   Procedure: ESOPHAGOGASTRODUODENOSCOPY (EGD);  Surgeon: Earline Mayotte, MD;  [facility]COPY;  Service: Endoscopy;  Laterality: N/A;   MASTECTOMY Bilateral 1983   right breast ca mastecomy only no chemo no rad bilat implants     Allergies: Allergies as of 07/30/2023 - Review Complete 07/30/2023   Allergen Reaction Noted   Statins Other (See Comments) 10/03/2015   Iodine  06/16/2014   Shellfish allergy  10/03/2015   Sulfa antibiotics  06/16/2014    Medications: Outpatient Encounter Medications as of 07/30/2023  Medication Sig   Accu-Chek Softclix Lancets lancets as directed Use as instructed.   aspirin EC 81 MG tablet Take 1 tablet by mouth daily.   Blood Glucose Monitoring Suppl (GLUCOCOM BLOOD GLUCOSE MONITOR) DEVI See admin instructions.   Calcium Carbonate-Vitamin D 600-400 MG-UNIT tablet Take 1 tablet by mouth 2 (two) times daily.   cyanocobalamin (VITAMIN B12) 1000 MCG tablet Take 1,000 mcg by mouth daily.   diltiazem (CARDIZEM CD) 120 MG 24 hr capsule Take 120 mg by mouth daily.   fexofenadine (ALLEGRA) 180 MG tablet Take 180 mg by mouth daily as needed.   fluticasone (FLONASE) 50 MCG/ACT nasal spray Place 1 spray into both nostrils daily.   glucose blood test strip    Multiple Vitamins-Minerals (MULTIVITAMIN GUMMIES ADULT PO) Take 1 each by mouth daily.   omeprazole (PRILOSEC) 20 MG capsule Take 20 mg by mouth daily.   pravastatin (PRAVACHOL) 10 MG tablet Take 1 tablet by mouth at bedtime.   traZODone (DESYREL) 50 MG tablet Take 1 tablet by mouth at bedtime.   venlafaxine (EFFEXOR) 75 MG tablet Take 75 mg by mouth daily.   [DISCONTINUED] Cysteamine Bitartrate (PROCYSBI) 300 MG PACK Use 1 each once daily e11.9   [DISCONTINUED] lisinopril (PRINIVIL,ZESTRIL)  2.5 MG tablet Take 1 tablet by mouth daily.   [DISCONTINUED] montelukast (SINGULAIR) 10 MG tablet Take by mouth.   [DISCONTINUED] NEOMYCIN-POLYMYXIN-HYDROCORTISONE (CORTISPORIN) 1 % SOLN OTIC solution Apply 1-2 drops to toe BID after soaking   No facility-administered encounter medications on file as of 07/30/2023.    Social History: Social History   Tobacco Use   Smoking status: Former    Current packs/day: 0.00    Types: Cigarettes    Quit date: 12/03/1995    Years since quitting: 27.6   Smokeless tobacco:  Never  Vaping Use   Vaping status: Never Used  Substance Use Topics   Alcohol use: No    Alcohol/week: 0.0 standard drinks of alcohol   Drug use: No    Family Medical History: Family History  Problem Relation Age of Onset   Leukemia Father 28   Non-Hodgkin's lymphoma Mother 76   Colon cancer Paternal Grandmother        age unknown   Breast cancer Cousin        paternal cancer    Physical Examination: Vitals:   07/30/23 1123  BP: 138/84    General: Patient is well developed, well nourished, calm, collected, and in no apparent distress. Attention to examination is appropriate.  Respiratory: Patient is breathing without any difficulty.   NEUROLOGICAL:     Awake, alert, oriented to person, place, and time.  Speech is clear and fluent. Fund of knowledge is appropriate.   Cranial Nerves: Pupils equal round and reactive to light.  Facial tone is symmetric.    CRANIAL NERVES: Extraocular muscles are intact  Facial sensation is intact bilaterally  Facial strength is intact bilaterally  Shoulder shrug strength is intact  Tongue protrudes midline   No abnormal lesions on exposed skin.   Strength: Side Biceps Triceps Deltoid Interossei Grip Wrist Ext. Wrist Flex.  R 5 5 5 5 5 5 5   L 5 5 5 5 5 5 5    Side Iliopsoas Quads Hamstring PF DF EHL  R 5 5 5 5 5 5   L 5 5 5 5 5 5    Reflexes are 2+ and symmetric at the biceps, brachioradialis, patella and achilles.   Hoffman's is absent.  Clonus is not present.   Bilateral upper and lower extremity sensation is intact to light touch.     Gait is slow.   Medical Decision Making  Imaging: No recent imaging.   Assessment and Plan: Ms. Sacchetti is a pleasant 82 y.o. female had an episode about t a week ago, she bent down to pick something in the yard, stood up, got dizzy, and fell fracturing her coccyx.   No other episodes of dizziness. She has history of mild dizziness for years and feels like she is back to her baseline. She  has no weakness, numbness, headaches, visual changes, or speech changes.   She does have known meningioma that was stable on last MRI in 2023.   Treatment options discussed with patient and following plan made:   - MRI of brain with and without contrast to evaluate meningioma.  - Will schedule phone visit to review MRI results once I get them back.   I spent a total of 20 minutes in face-to-face and non-face-to-face activities related to this patient's care today including review of outside records, review of imaging, review of symptoms, physical exam, discussion of differential diagnosis, discussion of treatment options, and documentation.   Thank you for involving me in the care of this patient.  Drake Leach PA-C Dept. of Neurosurgery

## 2023-07-30 ENCOUNTER — Encounter: Payer: Self-pay | Admitting: Orthopedic Surgery

## 2023-07-30 ENCOUNTER — Ambulatory Visit (INDEPENDENT_AMBULATORY_CARE_PROVIDER_SITE_OTHER): Payer: Medicare Other | Admitting: Orthopedic Surgery

## 2023-07-30 VITALS — BP 138/84 | Ht 62.5 in | Wt 147.0 lb

## 2023-07-30 DIAGNOSIS — D329 Benign neoplasm of meninges, unspecified: Secondary | ICD-10-CM | POA: Diagnosis not present

## 2023-07-30 NOTE — Patient Instructions (Signed)
It was so nice to see you today. Thank you so much for coming in.    I want to get an updated MRI of your brain. This will be with and without contrast (just like the last one you had done).   We will get this approved through your insurance and [facility] will call you to schedule the appointment.   [facility] (building with the white pillars) is located off of [address]. The address is [address], [place], [place] [address].  After you have the MRI, it takes 7-10 days for me to get the results back. Once I have them, we will call you to schedule a follow up phone visit with me to review them.    Please do not hesitate to call if you have any questions or concerns. You can also message me in MyChart.   Drake Leach PA-C [phone]

## 2023-08-05 ENCOUNTER — Ambulatory Visit
Admission: RE | Admit: 2023-08-05 | Discharge: 2023-08-05 | Disposition: A | Discharge status: Home / Self Care | Payer: Medicare Other | Source: Ambulatory Visit | Attending: Orthopedic Surgery | Admitting: Orthopedic Surgery

## 2023-08-05 DIAGNOSIS — D329 Benign neoplasm of meninges, unspecified: Secondary | ICD-10-CM | POA: Insufficient documentation

## 2023-08-05 MED ORDER — GADOBUTROL 1 MMOL/ML IV SOLN
6.0000 mL | Freq: Once | INTRAVENOUS | Status: AC | PRN
  Administered 2023-08-05: 6 mL via INTRAVENOUS

## 2023-08-25 ENCOUNTER — Encounter: Payer: Self-pay | Admitting: Orthopedic Surgery

## 2023-08-25 NOTE — Progress Notes (Signed)
MRI of brain dated 08/05/23:  FINDINGS: Brain: Redemonstrated right frontotemporal convexity meningioma measuring up to 1.6 x 1.0 x 1.8 cm (AP x TR x CC) (series 16, image 91 and series 17, image 14), previously 1.7 x 0.9 x 1.8 cm, overall unchanged. No significant mass effect or edema.   No restricted diffusion to suggest acute or subacute infarct. No abnormal parenchymal enhancement. No new no abnormal meningeal enhancement.   No acute hemorrhage, parenchymal mass, mass effect, or midline shift. No hydrocephalus or extra-axial collection. Partial empty sella. Normal craniocervical junction.   Scattered foci of hemosiderin deposition in the fell a and bilateral cerebral hemispheres, likely sequela prior hypertensive microhemorrhages. Advanced cerebral atrophy for age. Confluent T2 hyperintense signal in the periventricular white matter, likely the sequela of moderate to severe chronic small vessel ischemic disease. Dilated perivascular spaces in the basal ganglia.   Vascular: Normal arterial flow voids. Normal arterial and venous enhancement.   Skull and upper cervical spine: Normal marrow signal.   Sinuses/Orbits: Clear paranasal sinuses. Status post bilateral lens replacements.   Other: The mastoid air cells are well aerated.   IMPRESSION: 1. Stable right frontotemporal convexity meningioma. No significant mass effect or edema. 2. No acute intracranial process.  No new enhancing lesion.     Electronically Signed   By: Wiliam Ke M.D.   On: 08/22/2023 02:39  Above MRI reviewed with Dr. Myer Haff. He thinks it is very unlikely that her symptoms are from the meningioma.   If she is really worried about the meningioma, I can offer her a repeat scan in 1 to 2 years.  If she is not, she is fine to return to clinic for evaluation as needed.   If she has ongoing issues of dizziness, she could see ENT to evaluate.   Will discuss with patient when we review her results.

## 2023-08-27 ENCOUNTER — Ambulatory Visit (INDEPENDENT_AMBULATORY_CARE_PROVIDER_SITE_OTHER): Payer: Medicare Other | Admitting: Orthopedic Surgery

## 2023-08-27 ENCOUNTER — Encounter: Payer: Self-pay | Admitting: Orthopedic Surgery

## 2023-08-27 DIAGNOSIS — D329 Benign neoplasm of meninges, unspecified: Secondary | ICD-10-CM

## 2023-08-27 DIAGNOSIS — R42 Dizziness and giddiness: Secondary | ICD-10-CM | POA: Diagnosis not present

## 2023-08-27 NOTE — Progress Notes (Signed)
Telephone Visit- Progress Note: Referring Physician:  Rayetta Humphrey, MD [address] [place],  [place] [address]  Primary Physician:  Rayetta Humphrey, MD  This visit was performed via telephone.  Patient location: home Provider location: office  I spent a total of 10 minutes non-face-to-face activities for this visit on the date of this encounter including review of current clinical condition and response to treatment.    Patient has given verbal consent to this telephone visits and we reviewed the limitations of a telephone visit. Patient wishes to proceed.    Chief Complaint:  review MRI results  History of Present Illness: Melissa Curtis is a 82 y.o. female has a history of HTN, GERD, DM, osteopenia, breast CA, CLL.    Dr. Adriana Simas has been following her for a meningioma that appeared stable when last MRI was done 06/19/22. He recommended follow up MRI in 2 years.   I saw her on 07/30/23. A week prior, she had bent down to pick something in the yard, stood up, got dizzy, and fell fracturing her coccyx. Since that time, she note intermittent dizziness (this is her baseline).   MRI of brain was ordered and phone visit scheduled to review the results.   She is doing well. She has some intermittent dizziness (this is her baseline), but nothing new. She has no weakness, numbness, headaches, visual changes, or speech changes.     Exam: No exam done as this was a telephone encounter.     Imaging: MRI of brain dated 08/05/23:  FINDINGS: Brain: Redemonstrated right frontotemporal convexity meningioma measuring up to 1.6 x 1.0 x 1.8 cm (AP x TR x CC) (series 16, image 91 and series 17, image 14), previously 1.7 x 0.9 x 1.8 cm, overall unchanged. No significant mass effect or edema.   No restricted diffusion to suggest acute or subacute infarct. No abnormal parenchymal enhancement. No new no abnormal meningeal enhancement.   No acute hemorrhage, parenchymal mass, mass  effect, or midline shift. No hydrocephalus or extra-axial collection. Partial empty sella. Normal craniocervical junction.   Scattered foci of hemosiderin deposition in the fell a and bilateral cerebral hemispheres, likely sequela prior hypertensive microhemorrhages. Advanced cerebral atrophy for age. Confluent T2 hyperintense signal in the periventricular white matter, likely the sequela of moderate to severe chronic small vessel ischemic disease. Dilated perivascular spaces in the basal ganglia.   Vascular: Normal arterial flow voids. Normal arterial and venous enhancement.   Skull and upper cervical spine: Normal marrow signal.   Sinuses/Orbits: Clear paranasal sinuses. Status post bilateral lens replacements.   Other: The mastoid air cells are well aerated.   IMPRESSION: 1. Stable right frontotemporal convexity meningioma. No significant mass effect or edema. 2. No acute intracranial process.  No new enhancing lesion.     Electronically Signed   By: Wiliam Ke M.D.   On: 08/22/2023 02:39   I have personally reviewed the images and agree with the above interpretation.  Above imaging reviewed with Dr. Myer Haff prior to her visit.   Assessment and Plan: [name] is a pleasant 82 y.o. female had an episode prior to her last visit, she bent down to pick something in the yard, stood up, got dizzy, and fell fracturing her coccyx.    She does have known meningioma that was stable on last MRI in 2023.   She has some intermittent dizziness (this is her baseline), but nothing new. She has no weakness, numbness, headaches, visual changes, or speech  changes.    Above MRI reviewed with Dr. Myer Haff and it is stable.   Treatment options discussed with patient and following plan made:   - He does not think that above episode was from meningioma.  - If she continues with dizziness, then we can refer to ENT. She sees Dr. Jenne Campus and has a f/u scheduled. Will discuss with him  further.  - She is okay to follow up prn, but we can repeat MRI in 1-2 years if she is worried. She prefers to follow up prn.   Drake Leach PA-C Neurosurgery

## 2023-08-27 NOTE — Patient Instructions (Signed)
It was so nice to talk to you today.   The MRI of your brain looked stable. Dr. Myer Haff does not think that your falling episode was from the meningioma.   We recommend you discuss your chronic dizziness with Dr. Jenne Campus at your follow up appointment.   We can repeat MRI in 1-2 years if you are worried, but he thinks it is okay for you to follow up as needed if you don't want to do this.   Call if you have any new dizziness, weakness, numbness, headaches, vision changes, or speech changes.   Take care!  Drake Leach PA-C [phone]

## 2024-01-07 DIAGNOSIS — J309 Allergic rhinitis, unspecified: Secondary | ICD-10-CM | POA: Insufficient documentation

## 2024-03-11 ENCOUNTER — Telehealth: Payer: Self-pay | Admitting: *Deleted

## 2024-03-11 NOTE — Telephone Encounter (Signed)
 Dr. Greggory Stallion wants Dr. Donneta Romberg to see her again for her CLL. The last time Dr. Donneta Romberg is 03/27/20.If you need to call Dr. Greggory Stallion gave me [phone]- it is the number for the clinic

## 2024-04-12 ENCOUNTER — Other Ambulatory Visit: Payer: Self-pay | Admitting: *Deleted

## 2024-04-12 DIAGNOSIS — C9110 Chronic lymphocytic leukemia of B-cell type not having achieved remission: Secondary | ICD-10-CM

## 2024-04-13 ENCOUNTER — Inpatient Hospital Stay (HOSPITAL_BASED_OUTPATIENT_CLINIC_OR_DEPARTMENT_OTHER): Payer: Self-pay | Admitting: Nurse Practitioner

## 2024-04-13 ENCOUNTER — Inpatient Hospital Stay: Attending: Internal Medicine

## 2024-04-13 VITALS — BP 129/80 | HR 83 | Temp 98.8°F | Resp 16 | Wt 146.5 lb

## 2024-04-13 DIAGNOSIS — C9110 Chronic lymphocytic leukemia of B-cell type not having achieved remission: Secondary | ICD-10-CM | POA: Diagnosis present

## 2024-04-13 DIAGNOSIS — I129 Hypertensive chronic kidney disease with stage 1 through stage 4 chronic kidney disease, or unspecified chronic kidney disease: Secondary | ICD-10-CM | POA: Diagnosis not present

## 2024-04-13 DIAGNOSIS — N189 Chronic kidney disease, unspecified: Secondary | ICD-10-CM | POA: Insufficient documentation

## 2024-04-13 DIAGNOSIS — D329 Benign neoplasm of meninges, unspecified: Secondary | ICD-10-CM | POA: Insufficient documentation

## 2024-04-13 DIAGNOSIS — Z803 Family history of malignant neoplasm of breast: Secondary | ICD-10-CM | POA: Insufficient documentation

## 2024-04-13 DIAGNOSIS — Z807 Family history of other malignant neoplasms of lymphoid, hematopoietic and related tissues: Secondary | ICD-10-CM | POA: Insufficient documentation

## 2024-04-13 DIAGNOSIS — E1122 Type 2 diabetes mellitus with diabetic chronic kidney disease: Secondary | ICD-10-CM | POA: Diagnosis not present

## 2024-04-13 DIAGNOSIS — D696 Thrombocytopenia, unspecified: Secondary | ICD-10-CM | POA: Diagnosis not present

## 2024-04-13 DIAGNOSIS — Z79899 Other long term (current) drug therapy: Secondary | ICD-10-CM | POA: Insufficient documentation

## 2024-04-13 DIAGNOSIS — Z806 Family history of leukemia: Secondary | ICD-10-CM | POA: Diagnosis not present

## 2024-04-13 DIAGNOSIS — Z800 Family history of malignant neoplasm of digestive organs: Secondary | ICD-10-CM | POA: Diagnosis not present

## 2024-04-13 DIAGNOSIS — Z87891 Personal history of nicotine dependence: Secondary | ICD-10-CM | POA: Diagnosis not present

## 2024-04-13 LAB — CBC WITH DIFFERENTIAL (CANCER CENTER ONLY)
Abs Immature Granulocytes: 0.01 10*3/uL (ref 0.00–0.07)
Basophils Absolute: 0 10*3/uL (ref 0.0–0.1)
Basophils Relative: 0 %
Eosinophils Absolute: 0.1 10*3/uL (ref 0.0–0.5)
Eosinophils Relative: 1 %
HCT: 39.9 % (ref 36.0–46.0)
Hemoglobin: 13.1 g/dL (ref 12.0–15.0)
Immature Granulocytes: 0 %
Lymphocytes Relative: 79 %
Lymphs Abs: 9.8 10*3/uL — ABNORMAL HIGH (ref 0.7–4.0)
MCH: 32.3 pg (ref 26.0–34.0)
MCHC: 32.8 g/dL (ref 30.0–36.0)
MCV: 98.3 fL (ref 80.0–100.0)
Monocytes Absolute: 0.3 10*3/uL (ref 0.1–1.0)
Monocytes Relative: 3 %
Neutro Abs: 2.1 10*3/uL (ref 1.7–7.7)
Neutrophils Relative %: 17 %
Platelet Count: 142 10*3/uL — ABNORMAL LOW (ref 150–400)
RBC: 4.06 MIL/uL (ref 3.87–5.11)
RDW: 12.7 % (ref 11.5–15.5)
WBC Count: 12.3 10*3/uL — ABNORMAL HIGH (ref 4.0–10.5)
nRBC: 0 % (ref 0.0–0.2)

## 2024-04-13 LAB — LACTATE DEHYDROGENASE: LDH: 121 U/L (ref 98–192)

## 2024-04-13 LAB — CMP (CANCER CENTER ONLY)
ALT: 12 U/L (ref 0–44)
AST: 19 U/L (ref 15–41)
Albumin: 4 g/dL (ref 3.5–5.0)
Alkaline Phosphatase: 58 U/L (ref 38–126)
Anion gap: 7 (ref 5–15)
BUN: 13 mg/dL (ref 8–23)
CO2: 27 mmol/L (ref 22–32)
Calcium: 9 mg/dL (ref 8.9–10.3)
Chloride: 105 mmol/L (ref 98–111)
Creatinine: 0.9 mg/dL (ref 0.44–1.00)
GFR, Estimated: >60 mL/min (ref >60)
Glucose, Bld: 170 mg/dL — ABNORMAL HIGH (ref 70–99)
Potassium: 3.8 mmol/L (ref 3.5–5.1)
Sodium: 139 mmol/L (ref 135–145)
Total Bilirubin: 0.6 mg/dL (ref 0.0–1.2)
Total Protein: 6.6 g/dL (ref 6.5–8.1)

## 2024-04-13 NOTE — Progress Notes (Signed)
 [facility] CONSULT NOTE  Patient Care Team: Alexander Anes, MD as PCP - General (Family Medicine) Gwyn Leos, MD as Consulting Physician (Oncology)  CHIEF COMPLAINTS/PURPOSE OF CONSULTATION:   Oncology History Overview Note  # OCT 2016- CLL [peripheral blood flow] Rai Stage 0; CD-38-NEG; FISH- POS 13q del/ IgVH MUTATED [Good Prog] CT C/A/P- NO LN/spleen.    # 1983- Hx of Right breast ca?; s/p Mastecomy bil [as per pt]; no anti-hormonal therapy/chemo/RT   Chronic lymphocytic leukemia (HCC)    HISTORY OF PRESENTING ILLNESS:  Melissa Curtis 83 y.o.  female with above history of CLL, on surveillance, CKD, HTN, T2DM, Mengioma, chronic back pain, arrhythmia, who returns to clinic to reestablish care. She was last seen by Dr. Valentine Gasmen 03/27/20. She denies new lumps or bumps. Denies weight loss or drenching night sweats. Denies infections. She denies other complaints.   Review of Systems  Constitutional:  Negative for chills, diaphoresis, fever, malaise/fatigue and weight loss.  HENT:  Negative for nosebleeds and sore throat.   Eyes:  Negative for double vision.  Respiratory:  Negative for cough, hemoptysis, sputum production, shortness of breath and wheezing.   Cardiovascular:  Negative for chest pain, palpitations, orthopnea and leg swelling.  Gastrointestinal:  Negative for abdominal pain, blood in stool, constipation, diarrhea, heartburn, melena, nausea and vomiting.  Genitourinary:  Negative for dysuria, frequency and urgency.  Musculoskeletal:  Negative for back pain and joint pain.  Skin: Negative.  Negative for itching and rash.  Neurological:  Negative for dizziness, tingling, focal weakness, weakness and headaches.  Endo/Heme/Allergies:  Does not bruise/bleed easily.  Psychiatric/Behavioral:  Negative for depression. The patient is not nervous/anxious and does not have insomnia.    MEDICAL HISTORY:  Past Medical History:  Diagnosis Date   Breast  cancer (HCC) 1983   had double mastectomy   Cataracts, bilateral    CLL (chronic lymphocytic leukemia) (HCC)    Diabetes (HCC)    GERD (gastroesophageal reflux disease)    Hypertension    Leukocytosis 01/2014   Osteopenia     SURGICAL HISTORY: Past Surgical History:  Procedure Laterality Date   APPENDECTOMY     AUGMENTATION MAMMAPLASTY Bilateral 1983   COLONOSCOPY     by Dr. Felicita Horns over 5 years ago   double mastectomy with breast rescontruction  1983   ESOPHAGOGASTRODUODENOSCOPY N/A 06/06/2021   Procedure: ESOPHAGOGASTRODUODENOSCOPY (EGD);  Surgeon: Marshall Skeeter, MD;  Location: [facility] ENDOSCOPY;  Service: Endoscopy;  Laterality: N/A;   MASTECTOMY Bilateral 1983   right breast ca mastecomy only no chemo no rad bilat implants     SOCIAL HISTORY: lives in [place].  Social History   Socioeconomic History   Marital status: Married    Spouse name: Not on file   Number of children: Not on file   Years of education: Not on file   Highest education level: Not on file  Occupational History   Not on file  Tobacco Use   Smoking status: Former    Current packs/day: 0.00    Types: Cigarettes    Quit date: 12/03/1995    Years since quitting: 28.3   Smokeless tobacco: Never  Vaping Use   Vaping status: Never Used  Substance and Sexual Activity   Alcohol use: No    Alcohol/week: 0.0 standard drinks of alcohol   Drug use: No   Sexual activity: Yes    Partners: Male  Other Topics Concern   Not on file  Social History Narrative  Not on file   Social Drivers of Health   Financial Resource Strain: Low Risk  (03/28/2024)   Received from [facility]   Overall Financial Resource Strain (CARDIA)    Difficulty of Paying Living Expenses: Not very hard  Food Insecurity: No Food Insecurity (03/28/2024)   Received from [facility]   Hunger Vital Sign    Worried About Running Out of Food in the Last Year: Never true    Ran Out of Food in the Last  Year: Never true  Transportation Needs: No Transportation Needs (03/28/2024)   Received from [facility] - Transportation    In the past 12 months, has lack of transportation kept you from medical appointments or from getting medications?: No    Lack of Transportation (Non-Medical): No  Physical Activity: Not on file  Stress: Not on file  Social Connections: Not on file  Intimate Partner Violence: Not on file    FAMILY HISTORY: Family History  Problem Relation Age of Onset   Leukemia Father 54   Non-Hodgkin's lymphoma Mother 72   Colon cancer Paternal Grandmother        age unknown   Breast cancer Cousin        paternal cancer    ALLERGIES:  is allergic to statins, iodine, shellfish allergy, and sulfa antibiotics.   MEDICATIONS:  Current Outpatient Medications  Medication Sig Dispense Refill   aspirin EC 81 MG tablet Take 1 tablet by mouth daily.     Blood Glucose Monitoring Suppl (GLUCOCOM BLOOD GLUCOSE MONITOR) DEVI See admin instructions.     Calcium Carbonate-Vitamin D 600-400 MG-UNIT tablet Take 1 tablet by mouth 2 (two) times daily.     cyanocobalamin (VITAMIN B12) 1000 MCG tablet Take 1,000 mcg by mouth daily.     diltiazem (CARDIZEM CD) 120 MG 24 hr capsule Take 120 mg by mouth daily.     fluticasone (FLONASE) 50 MCG/ACT nasal spray Place 1 spray into both nostrils daily.     glucose blood test strip      Multiple Vitamins-Minerals (MULTIVITAMIN GUMMIES ADULT PO) Take 1 each by mouth daily.     omeprazole (PRILOSEC) 20 MG capsule Take 20 mg by mouth daily.     pravastatin (PRAVACHOL) 10 MG tablet Take 1 tablet by mouth at bedtime.     traZODone (DESYREL) 100 MG tablet Take 1 tablet by mouth at bedtime.     venlafaxine (EFFEXOR) 75 MG tablet Take 75 mg by mouth daily.     Accu-Chek Softclix Lancets lancets as directed Use as instructed.     azithromycin (ZITHROMAX) 250 MG tablet Take by mouth. (Patient not taking: Reported on 04/13/2024)      benzonatate (TESSALON) 200 MG capsule Take by mouth. (Patient not taking: Reported on 04/13/2024)     fexofenadine (ALLEGRA) 180 MG tablet Take 180 mg by mouth daily as needed. (Patient not taking: Reported on 04/13/2024)     mometasone (NASONEX) 50 MCG/ACT nasal spray Place 2 sprays into the nose. (Patient not taking: Reported on 04/13/2024)     predniSONE  (DELTASONE ) 10 MG tablet Take 10 mg by mouth 2 (two) times daily. (Patient not taking: Reported on 04/13/2024)     traZODone (DESYREL) 50 MG tablet Take 1 tablet by mouth at bedtime.     No current facility-administered medications for this visit.    PHYSICAL EXAMINATION: ECOG PERFORMANCE STATUS: 0 - Asymptomatic  Vitals:   04/13/24 1129  BP: 129/80  Pulse: 83  Resp: 16  Temp: 98.8 F (37.1 C)  SpO2: 96%   Filed Weights   04/13/24 1129  Weight: 146 lb 8 oz (66.5 kg)    Physical Exam Vitals reviewed.  Constitutional:      Appearance: She is not ill-appearing.  HENT:     Head: Normocephalic and atraumatic.     Mouth/Throat:     Pharynx: No oropharyngeal exudate.  Cardiovascular:     Rate and Rhythm: Normal rate and regular rhythm.  Pulmonary:     Effort: No respiratory distress.     Breath sounds: No wheezing.  Abdominal:     General: There is no distension.     Palpations: Abdomen is soft.     Tenderness: There is no abdominal tenderness. There is no guarding.  Musculoskeletal:        General: No tenderness.  Lymphadenopathy:     Cervical: No cervical adenopathy.  Skin:    General: Skin is warm.     Coloration: Skin is not pale.  Neurological:     Mental Status: She is alert and oriented to person, place, and time.  Psychiatric:        Mood and Affect: Mood and affect normal.        Behavior: Behavior normal.    LABORATORY DATA:  I have reviewed the data as listed Lab Results  Component Value Date   WBC 12.3 (H) 04/13/2024   HGB 13.1 04/13/2024   HCT 39.9 04/13/2024   MCV 98.3 04/13/2024   PLT 142 (L)  04/13/2024      Latest Ref Rng & Units 04/13/2024   11:13 AM 03/27/2020    1:50 PM 09/22/2019    1:47 PM  CMP  Glucose 70 - 99 mg/dL 161  096  045   BUN 8 - 23 mg/dL 13  11  10    Creatinine 0.44 - 1.00 mg/dL 4.09  8.11  9.14   Sodium 135 - 145 mmol/L 139  140  139   Potassium 3.5 - 5.1 mmol/L 3.8  3.9  3.8   Chloride 98 - 111 mmol/L 105  103  108   CO2 22 - 32 mmol/L 27  28  25    Calcium 8.9 - 10.3 mg/dL 9.0  9.1  8.9   Total Protein 6.5 - 8.1 g/dL 6.6  7.0  6.8   Total Bilirubin 0.0 - 1.2 mg/dL 0.6  0.7  0.7   Alkaline Phos 38 - 126 U/L 58  59  63   AST 15 - 41 U/L 19  19  21    ALT 0 - 44 U/L 12  17  16        ASSESSMENT & PLAN:   # CLL - IgVH Mutated/13 q del-FISH. White count elevated but improved/stable. Normal hemoglobin/platelets. We again reviewed indications for treatment of CLL including significant disease related symptoms (fatigue, night sweats, weight loss, fever without infection, thratened end organ function, progressive bulky disease (spleen >6cm below costal margin, lymph nodes >10 cm), progressive anemia, progressive thrombocytopenia. WBC today 12.3. Clinically asymptomatic. Continue surveillance annually with labs.   # Thrombocytopenia- plt 148. Mildly reduced. Monitor.   # Meningioma- has been followed by Dr. Debrah Fan with stable MRI July 2023 and plan to repeat July 2025.    DISPOSITION:  # follow up in 1 year - labs (CBC, CMP, LDH), Dr. Valentine Gasmen or myself- la  No problem-specific Assessment & Plan notes found for this encounter.   Nelda Balsam, NP 04/13/2024

## 2024-04-19 ENCOUNTER — Other Ambulatory Visit: Payer: Self-pay

## 2024-04-19 ENCOUNTER — Encounter: Payer: Self-pay | Admitting: *Deleted

## 2024-04-19 ENCOUNTER — Ambulatory Visit: Admitting: Anesthesiology

## 2024-04-19 ENCOUNTER — Ambulatory Visit
Admission: RE | Admit: 2024-04-19 | Discharge: 2024-04-19 | Disposition: A | Discharge status: Home / Self Care | Payer: Self-pay | Attending: Gastroenterology | Admitting: Gastroenterology

## 2024-04-19 ENCOUNTER — Encounter
Admission: RE | Disposition: A | Discharge status: Home / Self Care | Payer: Self-pay | Source: Home / Self Care | Attending: Gastroenterology

## 2024-04-19 DIAGNOSIS — I2510 Atherosclerotic heart disease of native coronary artery without angina pectoris: Secondary | ICD-10-CM | POA: Insufficient documentation

## 2024-04-19 DIAGNOSIS — E119 Type 2 diabetes mellitus without complications: Secondary | ICD-10-CM | POA: Diagnosis not present

## 2024-04-19 DIAGNOSIS — C9110 Chronic lymphocytic leukemia of B-cell type not having achieved remission: Secondary | ICD-10-CM | POA: Insufficient documentation

## 2024-04-19 DIAGNOSIS — K222 Esophageal obstruction: Secondary | ICD-10-CM | POA: Insufficient documentation

## 2024-04-19 DIAGNOSIS — R1310 Dysphagia, unspecified: Secondary | ICD-10-CM | POA: Diagnosis present

## 2024-04-19 DIAGNOSIS — Z87891 Personal history of nicotine dependence: Secondary | ICD-10-CM | POA: Diagnosis not present

## 2024-04-19 DIAGNOSIS — K219 Gastro-esophageal reflux disease without esophagitis: Secondary | ICD-10-CM | POA: Insufficient documentation

## 2024-04-19 DIAGNOSIS — K449 Diaphragmatic hernia without obstruction or gangrene: Secondary | ICD-10-CM | POA: Insufficient documentation

## 2024-04-19 DIAGNOSIS — I10 Essential (primary) hypertension: Secondary | ICD-10-CM | POA: Insufficient documentation

## 2024-04-19 HISTORY — PX: ESOPHAGOGASTRODUODENOSCOPY: SHX5428

## 2024-04-19 SURGERY — EGD (ESOPHAGOGASTRODUODENOSCOPY)
Anesthesia: General

## 2024-04-19 MED ORDER — ESMOLOL HCL 100 MG/10ML IV SOLN
INTRAVENOUS | Status: AC
  Filled 2024-04-19: qty 10

## 2024-04-19 MED ORDER — DEXMEDETOMIDINE HCL IN NACL 80 MCG/20ML IV SOLN
INTRAVENOUS | Status: DC | PRN
  Administered 2024-04-19: 4 ug via INTRAVENOUS

## 2024-04-19 MED ORDER — PROPOFOL 10 MG/ML IV BOLUS
INTRAVENOUS | Status: DC | PRN
  Administered 2024-04-19 (×2): 30 mg via INTRAVENOUS

## 2024-04-19 MED ORDER — PROPOFOL 10 MG/ML IV BOLUS
INTRAVENOUS | Status: AC
  Filled 2024-04-19: qty 20

## 2024-04-19 MED ORDER — LIDOCAINE HCL (PF) 2 % IJ SOLN
INTRAMUSCULAR | Status: AC
  Filled 2024-04-19: qty 5

## 2024-04-19 MED ORDER — SODIUM CHLORIDE 0.9 % IV SOLN: INTRAVENOUS | Status: DC

## 2024-04-19 MED ORDER — LIDOCAINE HCL (CARDIAC) PF 100 MG/5ML IV SOSY
PREFILLED_SYRINGE | INTRAVENOUS | Status: DC | PRN
  Administered 2024-04-19: 100 mg via INTRAVENOUS

## 2024-04-19 NOTE — Anesthesia Preprocedure Evaluation (Signed)
 Anesthesia Evaluation  Patient identified by MRN, date of birth, ID band Patient awake    Reviewed: Allergy & Precautions, H&P , NPO status , Patient's Chart, lab work & pertinent test results, reviewed documented beta blocker date and time   Airway Mallampati: II   Neck ROM: full    Dental  (+) Poor Dentition   Pulmonary neg pulmonary ROS, former smoker   Pulmonary exam normal        Cardiovascular Exercise Tolerance: Good hypertension, + CAD  Normal cardiovascular exam+ Valvular Problems/Murmurs  Rhythm:regular Rate:Normal     Neuro/Psych  Headaches  negative psych ROS   GI/Hepatic Neg liver ROS,GERD  Medicated,,  Endo/Other  negative endocrine ROSdiabetes    Renal/GU negative Renal ROS  negative genitourinary   Musculoskeletal   Abdominal   Peds  Hematology negative hematology ROS (+)   Anesthesia Other Findings Past Medical History: 1983: Breast cancer (HCC)     Comment:  had double mastectomy No date: Cataracts, bilateral No date: CLL (chronic lymphocytic leukemia) (HCC) No date: Diabetes (HCC) No date: GERD (gastroesophageal reflux disease) No date: Hypertension 01/2014: Leukocytosis No date: Osteopenia Past Surgical History: No date: APPENDECTOMY 1983: AUGMENTATION MAMMAPLASTY; Bilateral No date: COLONOSCOPY     Comment:  by Dr. Felicita Horns over 5 years ago 1983: double mastectomy with breast rescontruction 06/06/2021: ESOPHAGOGASTRODUODENOSCOPY; N/A     Comment:  Procedure: ESOPHAGOGASTRODUODENOSCOPY (EGD);  Surgeon:               Marshall Skeeter, MD;  Location: [facility] ENDOSCOPY;                Service: Endoscopy;  Laterality: N/A; 1983: MASTECTOMY; Bilateral     Comment:  right breast ca mastecomy only no chemo no rad bilat               implants  BMI    Body Mass Index: 26.45 kg/m     Reproductive/Obstetrics negative OB ROS                             Anesthesia  Physical Anesthesia Plan  ASA: 3  Anesthesia Plan: General   Post-op Pain Management:    Induction:   PONV Risk Score and Plan:   Airway Management Planned:   Additional Equipment:   Intra-op Plan:   Post-operative Plan:   Informed Consent: I have reviewed the patients History and Physical, chart, labs and discussed the procedure including the risks, benefits and alternatives for the proposed anesthesia with the patient or authorized representative who has indicated his/her understanding and acceptance.     Dental Advisory Given  Plan Discussed with: CRNA  Anesthesia Plan Comments:        Anesthesia Quick Evaluation

## 2024-04-19 NOTE — H&P (Signed)
 Outpatient short stay form Pre-procedure 04/19/2024  Shane Darling, MD  Primary Physician: George, Sionne A, MD  Reason for visit:  Dysphagia  History of present illness:    83 y/o lady with history of CLL, hypertension, and GERD here for EGD for solid food dysphagia. No blood thinners. No family history of GI malignancies. History of appendectomy.    Current Facility-Administered Medications:    0.9 %  sodium chloride  infusion, , Intravenous, Continuous, Anabell Swint, Leanora Prophet, MD, Last Rate: 20 mL/hr at 04/19/24 0843, New Bag at 04/19/24 0843  Medications Prior to Admission  Medication Sig Dispense Refill Last Dose/Taking   Calcium Carbonate-Vitamin D 600-400 MG-UNIT tablet Take 1 tablet by mouth 2 (two) times daily.   04/18/2024   cyanocobalamin (VITAMIN B12) 1000 MCG tablet Take 1,000 mcg by mouth daily.   04/18/2024   diltiazem (CARDIZEM CD) 120 MG 24 hr capsule Take 120 mg by mouth daily.   04/18/2024   fluticasone (FLONASE) 50 MCG/ACT nasal spray Place 1 spray into both nostrils daily.   04/18/2024   mometasone (NASONEX) 50 MCG/ACT nasal spray Place 2 sprays into the nose.   04/18/2024   Multiple Vitamins-Minerals (MULTIVITAMIN GUMMIES ADULT PO) Take 1 each by mouth daily.   04/18/2024   omeprazole (PRILOSEC) 20 MG capsule Take 20 mg by mouth daily.   04/18/2024   pravastatin (PRAVACHOL) 10 MG tablet Take 1 tablet by mouth at bedtime.   04/18/2024   traZODone (DESYREL) 100 MG tablet Take 1 tablet by mouth at bedtime.   04/18/2024   venlafaxine (EFFEXOR) 75 MG tablet Take 75 mg by mouth daily.   04/18/2024   Accu-Chek Softclix Lancets lancets as directed Use as instructed.      aspirin EC 81 MG tablet Take 1 tablet by mouth daily.      azithromycin (ZITHROMAX) 250 MG tablet Take by mouth. (Patient not taking: Reported on 04/13/2024)      benzonatate (TESSALON) 200 MG capsule Take by mouth. (Patient not taking: Reported on 04/13/2024)      Blood Glucose Monitoring Suppl (GLUCOCOM BLOOD  GLUCOSE MONITOR) DEVI See admin instructions.      fexofenadine (ALLEGRA) 180 MG tablet Take 180 mg by mouth daily as needed.      glucose blood test strip       predniSONE  (DELTASONE ) 10 MG tablet Take 10 mg by mouth 2 (two) times daily. (Patient not taking: Reported on 04/13/2024)      traZODone (DESYREL) 50 MG tablet Take 1 tablet by mouth at bedtime.        Allergies  Allergen Reactions   Statins Other (See Comments)    Could not tolerate Liptor due to aching legs and joints; However, she has been able to tolerate pravachol   Iodine    Shellfish Allergy    Sulfa Antibiotics      Past Medical History:  Diagnosis Date   Breast cancer (HCC) 1983   had double mastectomy   Cataracts, bilateral    CLL (chronic lymphocytic leukemia) (HCC)    Diabetes (HCC)    GERD (gastroesophageal reflux disease)    Hypertension    Leukocytosis 01/2014   Osteopenia     Review of systems:  Otherwise negative.    Physical Exam  Gen: Alert, oriented. Appears stated age.  HEENT: PERRLA. Lungs: No respiratory distress CV: RRR Abd: soft, benign, no masses Ext: No edema    Planned procedures: Proceed with EGD. The patient understands the nature of the planned procedure, indications,  risks, alternatives and potential complications including but not limited to bleeding, infection, perforation, damage to internal organs and possible oversedation/side effects from anesthesia. The patient agrees and gives consent to proceed.  Please refer to procedure notes for findings, recommendations and patient disposition/instructions.     Shane Darling, MD [facility]

## 2024-04-19 NOTE — Op Note (Signed)
 [facility] Patient Name: Melissa Curtis Procedure Date: 04/19/2024 9:09 AM MRN: [id number] Account #: [id number] Date of Birth: 1941/09/06 Admit Type: Outpatient Age: 83 Room: [facility] ENDO ROOM 3 Gender: Female Note Status: Finalized Instrument Name: Almyra Jain [id number] Procedure:             Upper GI endoscopy Indications:           Dysphagia Providers:             Leida Puna MD, MD Medicines:             Monitored Anesthesia Care Complications:         No immediate complications. Estimated blood loss:                         Minimal. Procedure:             Pre-Anesthesia Assessment:                        - Prior to the procedure, a History and Physical was                         performed, and patient medications and allergies were                         reviewed. The patient is competent. The risks and                         benefits of the procedure and the sedation options and                         risks were discussed with the patient. All questions                         were answered and informed consent was obtained.                         Patient identification and proposed procedure were                         verified by the physician, the nurse, the                         anesthesiologist, the anesthetist and the technician                         in the endoscopy suite. Mental Status Examination:                         alert and oriented. Airway Examination: normal                         oropharyngeal airway and neck mobility. Respiratory                         Examination: clear to auscultation. CV Examination:                         normal. Prophylactic Antibiotics: The patient does not  require prophylactic antibiotics. Prior                         Anticoagulants: The patient has taken no anticoagulant                         or antiplatelet agents. ASA Grade Assessment: III - A                          patient with severe systemic disease. After reviewing                         the risks and benefits, the patient was deemed in                         satisfactory condition to undergo the procedure. The                         anesthesia plan was to use monitored anesthesia care                         (MAC). Immediately prior to administration of                         medications, the patient was re-assessed for adequacy                         to receive sedatives. The heart rate, respiratory                         rate, oxygen saturations, blood pressure, adequacy of                         pulmonary ventilation, and response to care were                         monitored throughout the procedure. The physical                         status of the patient was re-assessed after the                         procedure.                        After obtaining informed consent, the endoscope was                         passed under direct vision. Throughout the procedure,                         the patient's blood pressure, pulse, and oxygen                         saturations were monitored continuously. The Endoscope                         was introduced through the mouth, and advanced to the  second part of duodenum. The upper GI endoscopy was                         accomplished without difficulty. The patient tolerated                         the procedure well. Findings:      A low-grade of narrowing Schatzki ring was found in the lower third of       the esophagus. A TTS dilator was passed through the scope. Dilation with       a 15-16.5-18 mm balloon dilator was performed to 18 mm. The dilation       site was examined and showed mild mucosal disruption. Estimated blood       loss was minimal.      A 2 cm hiatal hernia was present.      The entire examined stomach was normal.      The examined duodenum was normal. Impression:             - Low-grade of narrowing Schatzki ring. Dilated.                        - 2 cm hiatal hernia.                        - Normal stomach.                        - Normal examined duodenum.                        - No specimens collected. Recommendation:        - Discharge patient to home.                        - Resume previous diet.                        - Continue present medications.                        - Return to referring physician as previously                         scheduled. Procedure Code(s):     --- Professional ---                        [phone], Esophagogastroduodenoscopy, flexible,                         transoral; with transendoscopic balloon dilation of                         esophagus (less than 30 mm diameter) Diagnosis Code(s):     --- Professional ---                        K22.2, Esophageal obstruction                        K44.9, Diaphragmatic hernia without obstruction or  gangrene                        R13.10, Dysphagia, unspecified CPT copyright 2022 American Medical Association. All rights reserved. The codes documented in this report are preliminary and upon coder review may  be revised to meet current compliance requirements. Leida Puna MD, MD 04/19/2024 9:35:56 AM Number of Addenda: 0 Note Initiated On: 04/19/2024 9:09 AM Estimated Blood Loss:  Estimated blood loss was minimal.      [facility]

## 2024-04-19 NOTE — Interval H&P Note (Signed)
 History and Physical Interval Note:  04/19/2024 9:15 AM  Melissa Curtis  has presented today for surgery, with the diagnosis of R13.19 (ICD-10-CM) - Esophageal dysphagia.  The various methods of treatment have been discussed with the patient and family. After consideration of risks, benefits and other options for treatment, the patient has consented to  Procedure(s) with comments: EGD (ESOPHAGOGASTRODUODENOSCOPY) (N/A) - DM as a surgical intervention.  The patient's history has been reviewed, patient examined, no change in status, stable for surgery.  I have reviewed the patient's chart and labs.  Questions were answered to the patient's satisfaction.     Shane Darling  Ok to proceed with EGD

## 2024-04-19 NOTE — Anesthesia Procedure Notes (Signed)
 Procedure Name: General with mask airway Date/Time: 04/19/2024 9:25 AM  Performed by: Carolynn Citrin, CRNAPre-anesthesia Checklist: Patient identified, Emergency Drugs available, Suction available, Patient being monitored and Timeout performed Patient Re-evaluated:Patient Re-evaluated prior to induction Oxygen Delivery Method: Nasal cannula Preoxygenation: Pre-oxygenation with 100% oxygen Induction Type: IV induction Placement Confirmation: positive ETCO2

## 2024-04-19 NOTE — Transfer of Care (Signed)
 Immediate Anesthesia Transfer of Care Note  Patient: Melissa Curtis  Procedure(s) Performed: EGD (ESOPHAGOGASTRODUODENOSCOPY) Balloon dilation wire-guided  Patient Location: PACU and Endoscopy Unit  Anesthesia Type:General  Level of Consciousness: drowsy and responds to stimulation  Airway & Oxygen Therapy: Patient Spontanous Breathing  Post-op Assessment: Report given to RN and Post -op Vital signs reviewed and stable  Post vital signs: Reviewed and stable  Last Vitals:  Vitals Value Taken Time  BP 151/68 04/19/24 0934  Temp 35.6 C 04/19/24 0934  Pulse 55 04/19/24 0936  Resp 17 04/19/24 0936  SpO2 94 % 04/19/24 0936  Vitals shown include unfiled device data.  Last Pain:  Vitals:   04/19/24 0934  TempSrc: Temporal  PainSc: 0-No pain         Complications: No notable events documented.

## 2024-04-20 ENCOUNTER — Encounter: Payer: Self-pay | Admitting: Gastroenterology

## 2024-04-21 NOTE — Anesthesia Postprocedure Evaluation (Signed)
 Anesthesia Post Note  Patient: Melissa Curtis  Procedure(s) Performed: EGD (ESOPHAGOGASTRODUODENOSCOPY) Balloon dilation wire-guided  Patient location during evaluation: PACU Anesthesia Type: General Level of consciousness: awake and alert Pain management: pain level controlled Vital Signs Assessment: post-procedure vital signs reviewed and stable Respiratory status: spontaneous breathing, nonlabored ventilation, respiratory function stable and patient connected to nasal cannula oxygen Cardiovascular status: blood pressure returned to baseline and stable Postop Assessment: no apparent nausea or vomiting Anesthetic complications: no   No notable events documented.   Last Vitals:  Vitals:   04/19/24 0934 04/19/24 0944  BP: (!) 151/68 (!) 166/82  Pulse: (!) 55 65  Resp: 18 14  Temp: (!) 35.6 C   SpO2: 96% 97%    Last Pain:  Vitals:   04/20/24 0740  TempSrc:   PainSc: 0-No pain                 Zula Hitch

## 2024-07-23 ENCOUNTER — Emergency Department

## 2024-07-23 ENCOUNTER — Inpatient Hospital Stay

## 2024-07-23 ENCOUNTER — Inpatient Hospital Stay
Admission: EM | Admit: 2024-07-23 | Discharge: 2024-07-27 | DRG: 871 | Disposition: A | Discharge status: Home / Self Care

## 2024-07-23 ENCOUNTER — Other Ambulatory Visit: Payer: Self-pay

## 2024-07-23 DIAGNOSIS — I5031 Acute diastolic (congestive) heart failure: Secondary | ICD-10-CM | POA: Diagnosis not present

## 2024-07-23 DIAGNOSIS — T502X5A Adverse effect of carbonic-anhydrase inhibitors, benzothiadiazides and other diuretics, initial encounter: Secondary | ICD-10-CM | POA: Diagnosis not present

## 2024-07-23 DIAGNOSIS — I2510 Atherosclerotic heart disease of native coronary artery without angina pectoris: Secondary | ICD-10-CM | POA: Diagnosis present

## 2024-07-23 DIAGNOSIS — M7072 Other bursitis of hip, left hip: Secondary | ICD-10-CM | POA: Diagnosis present

## 2024-07-23 DIAGNOSIS — J9601 Acute respiratory failure with hypoxia: Secondary | ICD-10-CM | POA: Diagnosis present

## 2024-07-23 DIAGNOSIS — M8580 Other specified disorders of bone density and structure, unspecified site: Secondary | ICD-10-CM | POA: Diagnosis present

## 2024-07-23 DIAGNOSIS — D329 Benign neoplasm of meninges, unspecified: Secondary | ICD-10-CM | POA: Diagnosis present

## 2024-07-23 DIAGNOSIS — I110 Hypertensive heart disease with heart failure: Secondary | ICD-10-CM | POA: Diagnosis present

## 2024-07-23 DIAGNOSIS — Z807 Family history of other malignant neoplasms of lymphoid, hematopoietic and related tissues: Secondary | ICD-10-CM | POA: Diagnosis not present

## 2024-07-23 DIAGNOSIS — Z856 Personal history of leukemia: Secondary | ICD-10-CM | POA: Diagnosis not present

## 2024-07-23 DIAGNOSIS — K219 Gastro-esophageal reflux disease without esophagitis: Secondary | ICD-10-CM | POA: Diagnosis present

## 2024-07-23 DIAGNOSIS — D539 Nutritional anemia, unspecified: Secondary | ICD-10-CM | POA: Diagnosis present

## 2024-07-23 DIAGNOSIS — J189 Pneumonia, unspecified organism: Secondary | ICD-10-CM | POA: Diagnosis present

## 2024-07-23 DIAGNOSIS — E876 Hypokalemia: Secondary | ICD-10-CM | POA: Diagnosis not present

## 2024-07-23 DIAGNOSIS — Z806 Family history of leukemia: Secondary | ICD-10-CM

## 2024-07-23 DIAGNOSIS — Z800 Family history of malignant neoplasm of digestive organs: Secondary | ICD-10-CM | POA: Diagnosis not present

## 2024-07-23 DIAGNOSIS — Z91041 Radiographic dye allergy status: Secondary | ICD-10-CM

## 2024-07-23 DIAGNOSIS — J690 Pneumonitis due to inhalation of food and vomit: Secondary | ICD-10-CM | POA: Diagnosis not present

## 2024-07-23 DIAGNOSIS — Z9013 Acquired absence of bilateral breasts and nipples: Secondary | ICD-10-CM

## 2024-07-23 DIAGNOSIS — A419 Sepsis, unspecified organism: Secondary | ICD-10-CM | POA: Diagnosis not present

## 2024-07-23 DIAGNOSIS — Z853 Personal history of malignant neoplasm of breast: Secondary | ICD-10-CM

## 2024-07-23 DIAGNOSIS — E875 Hyperkalemia: Secondary | ICD-10-CM | POA: Diagnosis not present

## 2024-07-23 DIAGNOSIS — Z79899 Other long term (current) drug therapy: Secondary | ICD-10-CM

## 2024-07-23 DIAGNOSIS — Z7982 Long term (current) use of aspirin: Secondary | ICD-10-CM

## 2024-07-23 DIAGNOSIS — R112 Nausea with vomiting, unspecified: Secondary | ICD-10-CM | POA: Diagnosis present

## 2024-07-23 DIAGNOSIS — Z91013 Allergy to seafood: Secondary | ICD-10-CM

## 2024-07-23 DIAGNOSIS — Z87891 Personal history of nicotine dependence: Secondary | ICD-10-CM

## 2024-07-23 DIAGNOSIS — Z882 Allergy status to sulfonamides status: Secondary | ICD-10-CM

## 2024-07-23 DIAGNOSIS — Z803 Family history of malignant neoplasm of breast: Secondary | ICD-10-CM

## 2024-07-23 DIAGNOSIS — E1165 Type 2 diabetes mellitus with hyperglycemia: Secondary | ICD-10-CM | POA: Diagnosis present

## 2024-07-23 DIAGNOSIS — F32A Depression, unspecified: Secondary | ICD-10-CM | POA: Diagnosis present

## 2024-07-23 DIAGNOSIS — F419 Anxiety disorder, unspecified: Secondary | ICD-10-CM | POA: Diagnosis present

## 2024-07-23 LAB — CBG MONITORING, ED
Glucose-Capillary: 122 mg/dL — ABNORMAL HIGH (ref 70–99)
Glucose-Capillary: 113 mg/dL — ABNORMAL HIGH (ref 70–99)

## 2024-07-23 LAB — LACTIC ACID, PLASMA
Lactic Acid, Venous: 2.6 mmol/L (ref 0.5–1.9)
Lactic Acid, Venous: 2.6 mmol/L (ref 0.5–1.9)

## 2024-07-23 LAB — COMPREHENSIVE METABOLIC PANEL WITH GFR
ALT: 11 U/L (ref 0–44)
AST: 15 U/L (ref 15–41)
Albumin: 3.1 g/dL — ABNORMAL LOW (ref 3.5–5.0)
Alkaline Phosphatase: 53 U/L (ref 38–126)
Anion gap: 11 (ref 5–15)
BUN: 21 mg/dL (ref 8–23)
CO2: 25 mmol/L (ref 22–32)
Calcium: 8.7 mg/dL — ABNORMAL LOW (ref 8.9–10.3)
Chloride: 101 mmol/L (ref 98–111)
Creatinine, Ser: 0.89 mg/dL (ref 0.44–1.00)
GFR, Estimated: >60 mL/min (ref >60)
Glucose, Bld: 188 mg/dL — ABNORMAL HIGH (ref 70–99)
Potassium: 4.2 mmol/L (ref 3.5–5.1)
Sodium: 137 mmol/L (ref 135–145)
Total Bilirubin: 0.7 mg/dL (ref 0.0–1.2)
Total Protein: 5.5 g/dL — ABNORMAL LOW (ref 6.5–8.1)

## 2024-07-23 LAB — CBC WITH DIFFERENTIAL/PLATELET
Abs Immature Granulocytes: 0.06 K/uL (ref 0.00–0.07)
Basophils Absolute: 0.1 K/uL (ref 0.0–0.1)
Basophils Relative: 0 %
Eosinophils Absolute: 0 K/uL (ref 0.0–0.5)
Eosinophils Relative: 0 %
HCT: 41.5 % (ref 36.0–46.0)
Hemoglobin: 13.4 g/dL (ref 12.0–15.0)
Immature Granulocytes: 0 %
Lymphocytes Relative: 88 %
Lymphs Abs: 36.3 K/uL — ABNORMAL HIGH (ref 0.7–4.0)
MCH: 32.4 pg (ref 26.0–34.0)
MCHC: 32.3 g/dL (ref 30.0–36.0)
MCV: 100.5 fL — ABNORMAL HIGH (ref 80.0–100.0)
Monocytes Absolute: 0.1 K/uL (ref 0.1–1.0)
Monocytes Relative: 0 %
Neutro Abs: 4.8 K/uL (ref 1.7–7.7)
Neutrophils Relative %: 12 %
Platelets: 150 K/uL (ref 150–400)
RBC: 4.13 MIL/uL (ref 3.87–5.11)
RDW: 13.7 % (ref 11.5–15.5)
Smear Review: NORMAL
WBC: 41.3 K/uL — ABNORMAL HIGH (ref 4.0–10.5)
nRBC: 0 % (ref 0.0–0.2)

## 2024-07-23 LAB — URINALYSIS, COMPLETE (UACMP) WITH MICROSCOPIC
Bilirubin Urine: NEGATIVE
Glucose, UA: NEGATIVE mg/dL
Hgb urine dipstick: NEGATIVE
Ketones, ur: NEGATIVE mg/dL
Leukocytes,Ua: NEGATIVE
Nitrite: NEGATIVE
Protein, ur: NEGATIVE mg/dL
Specific Gravity, Urine: 1.016 (ref 1.005–1.030)
pH: 5 (ref 5.0–8.0)

## 2024-07-23 LAB — TROPONIN I (HIGH SENSITIVITY)
Troponin I (High Sensitivity): 5 ng/L (ref <18)
Troponin I (High Sensitivity): 6 ng/L (ref <18)

## 2024-07-23 LAB — CBC
HCT: 40.5 % (ref 36.0–46.0)
Hemoglobin: 13 g/dL (ref 12.0–15.0)
MCH: 32.4 pg (ref 26.0–34.0)
MCHC: 32.1 g/dL (ref 30.0–36.0)
MCV: 101 fL — ABNORMAL HIGH (ref 80.0–100.0)
Platelets: 156 K/uL (ref 150–400)
RBC: 4.01 MIL/uL (ref 3.87–5.11)
RDW: 13.8 % (ref 11.5–15.5)
WBC: 46.3 K/uL — ABNORMAL HIGH (ref 4.0–10.5)
nRBC: 0 % (ref 0.0–0.2)

## 2024-07-23 LAB — RESP PANEL BY RT-PCR (RSV, FLU A&B, COVID)  RVPGX2
Influenza A by PCR: NEGATIVE
Influenza B by PCR: NEGATIVE
Resp Syncytial Virus by PCR: NEGATIVE
SARS Coronavirus 2 by RT PCR: NEGATIVE

## 2024-07-23 LAB — GLUCOSE, CAPILLARY: Glucose-Capillary: 170 mg/dL — ABNORMAL HIGH (ref 70–99)

## 2024-07-23 LAB — MRSA NEXT GEN BY PCR, NASAL: MRSA by PCR Next Gen: NOT DETECTED

## 2024-07-23 LAB — HEMOGLOBIN A1C
Hgb A1c MFr Bld: 6.9 % — ABNORMAL HIGH (ref 4.8–5.6)
Mean Plasma Glucose: 151.33 mg/dL

## 2024-07-23 MED ORDER — PANTOPRAZOLE SODIUM 40 MG PO TBEC
40.0000 mg | DELAYED_RELEASE_TABLET | Freq: Every day | ORAL | Status: DC
  Administered 2024-07-24 – 2024-07-27 (×4): 40 mg via ORAL
  Filled 2024-07-23 (×4): qty 1

## 2024-07-23 MED ORDER — SODIUM CHLORIDE 0.9 % IV SOLN
3.0000 g | Freq: Once | INTRAVENOUS | Status: AC
  Administered 2024-07-23: 3 g via INTRAVENOUS
  Filled 2024-07-23: qty 8

## 2024-07-23 MED ORDER — CHLORHEXIDINE GLUCONATE CLOTH 2 % EX PADS
6.0000 | MEDICATED_PAD | Freq: Every day | CUTANEOUS | Status: DC
  Administered 2024-07-24 – 2024-07-27 (×4): 6 via TOPICAL

## 2024-07-23 MED ORDER — ENOXAPARIN SODIUM 40 MG/0.4ML IJ SOSY
40.0000 mg | PREFILLED_SYRINGE | INTRAMUSCULAR | Status: DC
  Administered 2024-07-23 – 2024-07-26 (×4): 40 mg via SUBCUTANEOUS
  Filled 2024-07-23 (×4): qty 0.4

## 2024-07-23 MED ORDER — TRAZODONE HCL 100 MG PO TABS
100.0000 mg | ORAL_TABLET | Freq: Every day | ORAL | Status: DC
Start: 2024-07-23 — End: 2024-07-27
  Administered 2024-07-23 – 2024-07-26 (×4): 100 mg via ORAL
  Filled 2024-07-23 (×4): qty 1

## 2024-07-23 MED ORDER — INSULIN ASPART 100 UNIT/ML IJ SOLN
0.0000 [IU] | Freq: Three times a day (TID) | INTRAMUSCULAR | Status: DC
  Administered 2024-07-24: 1 [IU] via SUBCUTANEOUS
  Administered 2024-07-24: 5 [IU] via SUBCUTANEOUS
  Administered 2024-07-25: 2 [IU] via SUBCUTANEOUS
  Administered 2024-07-25: 1 [IU] via SUBCUTANEOUS
  Administered 2024-07-26: 5 [IU] via SUBCUTANEOUS
  Administered 2024-07-26 (×2): 3 [IU] via SUBCUTANEOUS
  Administered 2024-07-27: 1 [IU] via SUBCUTANEOUS
  Administered 2024-07-27: 2 [IU] via SUBCUTANEOUS
  Filled 2024-07-23 (×9): qty 1

## 2024-07-23 MED ORDER — DILTIAZEM HCL ER COATED BEADS 120 MG PO CP24
120.0000 mg | ORAL_CAPSULE | Freq: Every day | ORAL | Status: DC
  Administered 2024-07-23: 120 mg via ORAL
  Filled 2024-07-23 (×3): qty 1

## 2024-07-23 MED ORDER — LACTATED RINGERS IV BOLUS
250.0000 mL | Freq: Once | INTRAVENOUS | Status: AC
  Administered 2024-07-23: 250 mL via INTRAVENOUS

## 2024-07-23 MED ORDER — ASPIRIN 81 MG PO TBEC
81.0000 mg | DELAYED_RELEASE_TABLET | Freq: Every day | ORAL | Status: DC
  Administered 2024-07-23 – 2024-07-27 (×5): 81 mg via ORAL
  Filled 2024-07-23 (×6): qty 1

## 2024-07-23 MED ORDER — ONDANSETRON HCL 4 MG/2ML IJ SOLN
4.0000 mg | Freq: Once | INTRAMUSCULAR | Status: AC
  Administered 2024-07-23: 4 mg via INTRAVENOUS
  Filled 2024-07-23: qty 2

## 2024-07-23 MED ORDER — LACTATED RINGERS IV SOLN: INTRAVENOUS | Status: DC

## 2024-07-23 MED ORDER — FLUTICASONE PROPIONATE 50 MCG/ACT NA SUSP: 1.0000 | Freq: Every day | NASAL | Status: DC | PRN

## 2024-07-23 MED ORDER — GUAIFENESIN ER 600 MG PO TB12
1200.0000 mg | ORAL_TABLET | Freq: Two times a day (BID) | ORAL | Status: DC
  Administered 2024-07-23 – 2024-07-27 (×8): 1200 mg via ORAL
  Filled 2024-07-23 (×9): qty 2

## 2024-07-23 MED ORDER — ONDANSETRON HCL 4 MG PO TABS: 4.0000 mg | ORAL_TABLET | Freq: Four times a day (QID) | ORAL | Status: DC | PRN

## 2024-07-23 MED ORDER — SODIUM CHLORIDE 0.9 % IV SOLN
3.0000 g | Freq: Four times a day (QID) | INTRAVENOUS | Status: DC
  Administered 2024-07-23 – 2024-07-27 (×15): 3 g via INTRAVENOUS
  Filled 2024-07-23 (×18): qty 8

## 2024-07-23 MED ORDER — ONDANSETRON HCL 4 MG/2ML IJ SOLN
4.0000 mg | Freq: Four times a day (QID) | INTRAMUSCULAR | Status: DC | PRN
  Administered 2024-07-24: 4 mg via INTRAVENOUS
  Filled 2024-07-23: qty 2

## 2024-07-23 MED ORDER — PRAVASTATIN SODIUM 20 MG PO TABS
10.0000 mg | ORAL_TABLET | Freq: Every day | ORAL | Status: DC
  Administered 2024-07-23 – 2024-07-25 (×3): 10 mg via ORAL
  Filled 2024-07-23 (×3): qty 1

## 2024-07-23 MED ORDER — LORATADINE 10 MG PO TABS
10.0000 mg | ORAL_TABLET | Freq: Every day | ORAL | Status: DC
  Administered 2024-07-24 – 2024-07-27 (×4): 10 mg via ORAL
  Filled 2024-07-23 (×4): qty 1

## 2024-07-23 MED ORDER — VENLAFAXINE HCL ER 75 MG PO CP24
75.0000 mg | ORAL_CAPSULE | Freq: Every day | ORAL | Status: DC
  Administered 2024-07-23 – 2024-07-27 (×5): 75 mg via ORAL
  Filled 2024-07-23 (×7): qty 1

## 2024-07-23 MED ORDER — SODIUM CHLORIDE 0.9 % IV BOLUS
1000.0000 mL | Freq: Once | INTRAVENOUS | Status: AC
  Administered 2024-07-23: 1000 mL via INTRAVENOUS

## 2024-07-23 MED ORDER — IPRATROPIUM-ALBUTEROL 0.5-2.5 (3) MG/3ML IN SOLN
3.0000 mL | Freq: Four times a day (QID) | RESPIRATORY_TRACT | Status: DC
  Administered 2024-07-23 – 2024-07-24 (×3): 3 mL via RESPIRATORY_TRACT
  Filled 2024-07-23 (×4): qty 3

## 2024-07-23 NOTE — ED Notes (Signed)
 Dinner provided.

## 2024-07-23 NOTE — H&P (Signed)
 History and Physical    Melissa Curtis FMW:[id number] DOB: [birth year] DOA: 07/23/2024  PCP: Zachary Idelia LABOR, MD (Confirm with patient/family/NH records and if not entered, this has to be entered at [facility] point of entry) Patient coming from: Home  I have personally briefly reviewed patient's old medical records in [facility]  Chief Complaint: SOB  HPI: Melissa Curtis is a 83 y.o. 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  Patient was doing fine yesterday but this morning she started to feel nauseous and vomited several times and so after family noticed patient skin color turned  totally blue and not sweaty, patient complained about feeling dizzy became unresponsive for few seconds, no fall no head or neck injury.  EMS arrived and found patient hypoxic blood pressure 95/60 and patient was given 500 mL of IV bolus.  Patient denied any abdominal pain but feeling nausea, denied any diarrhea no fever or chills.  Last week, patient had a flareup of knee pain, was prescribed with 6 days of prednisone  which she just completed yesterday. ED Course: Afebrile, tachycardia heart rate 110s, blood pressure 130/70 O2 saturation 93% on 6 L.  Chest x-ray showed bilateral lower fields infiltrates likely aspiration pneumonia, blood work showed WBC 46.3 compared to baseline 12-15, hemoglobin 13 BUN 21 creatinine 0.8 glucose was 88.  Patient was given Unasyn  x 1 in the ED.  Review of Systems: As per HPI otherwise 14 point review of systems negative.    Past Medical History:  Diagnosis Date   Breast cancer (HCC) 1983   had double mastectomy   Cataracts, bilateral    CLL (chronic lymphocytic leukemia) (HCC)    Diabetes (HCC)    GERD (gastroesophageal reflux disease)    Hypertension    Leukocytosis  01/2014   Osteopenia     Past Surgical History:  Procedure Laterality Date   APPENDECTOMY     AUGMENTATION MAMMAPLASTY Bilateral 1983   COLONOSCOPY     by Dr. Viktoria over 5 years ago   double mastectomy with breast rescontruction  1983   ESOPHAGOGASTRODUODENOSCOPY N/A 06/06/2021   Procedure: ESOPHAGOGASTRODUODENOSCOPY (EGD);  Surgeon: Dessa Reyes ORN, MD;  Location: [facility] ENDOSCOPY;  Service: Endoscopy;  Laterality: N/A;   ESOPHAGOGASTRODUODENOSCOPY N/A 04/19/2024   Procedure: EGD (ESOPHAGOGASTRODUODENOSCOPY);  Surgeon: Maryruth Ole DASEN, MD;  [facility] ENDOSCOPY;  Service: Endoscopy;  Laterality: N/A;  DM   MASTECTOMY Bilateral 1983   right breast ca mastecomy only no chemo no rad bilat implants      reports that she quit smoking about 28 years ago. Her smoking use included cigarettes. She has never used smokeless tobacco. She reports that she does not drink alcohol and does not use drugs.  Allergies  Allergen Reactions   Statins Other (See Comments)    Could not tolerate Liptor due to aching legs and joints; However, she has been able to tolerate pravachol    Iodine    Shellfish Allergy    Sulfa Antibiotics     Family History  Problem Relation Age of Onset   Leukemia Father 80   Non-Hodgkin's lymphoma Mother 28   Colon cancer Paternal Grandmother        age unknown   Breast cancer Cousin        paternal cancer    Prior to Admission medications   Medication Sig Start  Date End Date Taking? Authorizing Provider  Accu-Chek Softclix Lancets lancets as directed Use as instructed. 04/13/21   [provider]  aspirin  EC 81 MG tablet Take 1 tablet by mouth daily.    [provider]  azithromycin (ZITHROMAX) 250 MG tablet Take by mouth. Patient not taking: Reported on 04/13/2024 12/11/23   [provider]  benzonatate (TESSALON) 200 MG capsule Take by mouth. Patient not taking: Reported on 04/13/2024 12/11/23   [provider]  Blood Glucose  Monitoring Suppl (GLUCOCOM BLOOD GLUCOSE MONITOR) DEVI See admin instructions. 02/21/22   [provider]  Calcium Carbonate-Vitamin D 600-400 MG-UNIT tablet Take 1 tablet by mouth 2 (two) times daily. 01/15/15 03/24/38  [provider]  cyanocobalamin (VITAMIN B12) 1000 MCG tablet Take 1,000 mcg by mouth daily.    [provider]  diltiazem  (CARDIZEM  CD) 120 MG 24 hr capsule Take 120 mg by mouth daily. 06/12/23 06/11/24  [provider]  fexofenadine (ALLEGRA) 180 MG tablet Take 180 mg by mouth daily as needed. 03/05/17 03/24/38  [provider]  fluticasone  (FLONASE ) 50 MCG/ACT nasal spray Place 1 spray into both nostrils daily. 09/07/15   [provider]  glucose blood test strip  11/08/14   [provider]  mometasone (NASONEX) 50 MCG/ACT nasal spray Place 2 sprays into the nose. 01/01/24 12/31/24  [provider]  Multiple Vitamins-Minerals (MULTIVITAMIN GUMMIES ADULT PO) Take 1 each by mouth daily.    [provider]  omeprazole (PRILOSEC) 20 MG capsule Take 20 mg by mouth daily.    [provider]  pravastatin  (PRAVACHOL ) 10 MG tablet Take 1 tablet by mouth at bedtime. 09/12/15   [provider]  predniSONE  (DELTASONE ) 10 MG tablet Take 10 mg by mouth 2 (two) times daily. Patient not taking: Reported on 04/13/2024 12/11/23   [provider]  traZODone  (DESYREL ) 100 MG tablet Take 1 tablet by mouth at bedtime. 03/04/24 03/04/25  [provider]  traZODone  (DESYREL ) 50 MG tablet Take 1 tablet by mouth at bedtime. 06/28/15   [provider]  venlafaxine  (EFFEXOR ) 75 MG tablet Take 75 mg by mouth daily.    [provider]    Physical Exam: Vitals:   07/23/24 1435 07/23/24 1500 07/23/24 1519 07/23/24 1520  BP: (!) 155/83 131/73    Pulse: (!) 106  (!) 117 (!) 119  Resp: (!) 25 (!) 25 (!) 24 (!) 26  Temp:      TempSrc:      SpO2: 96%  93% 93%  Weight:      Height:         Constitutional: NAD, calm, comfortable Vitals:   07/23/24 1435 07/23/24 1500 07/23/24 1519 07/23/24 1520  BP: (!) 155/83 131/73    Pulse: (!) 106  (!) 117 (!) 119  Resp: (!) 25 (!) 25 (!) 24 (!) 26  Temp:      TempSrc:      SpO2: 96%  93% 93%  Weight:      Height:       Eyes: PERRL, lids and conjunctivae normal ENMT: Mucous membranes are moist. Posterior pharynx clear of any exudate or lesions.Normal dentition.  Neck: normal, supple, no masses, no thyromegaly Respiratory: clear to auscultation bilaterally, no wheezing, increasing crackles on right lower field, increasing respiratory effort. No accessory muscle use.  Cardiovascular: Regular rate and rhythm, no murmurs / rubs / gallops. No extremity edema. 2+ pedal pulses. No carotid bruits.  Abdomen: no tenderness, no masses palpated. No  hepatosplenomegaly. Bowel sounds positive.  Musculoskeletal: no clubbing / cyanosis. No joint deformity upper and lower extremities. Good ROM, no contractures. Normal muscle tone.  Skin: no rashes, lesions, ulcers. No induration Neurologic: CN 2-12 grossly intact. Sensation intact, DTR normal. Strength 5/5 in all 4.  Psychiatric: Normal judgment and insight. Alert and oriented x 3. Normal mood.    Labs on Admission: I have personally reviewed following labs and imaging studies  CBC: Recent Labs  Lab 07/23/24 1056  WBC 46.3*  HGB 13.0  HCT 40.5  MCV 101.0*  PLT 156   Basic Metabolic Panel: Recent Labs  Lab 07/23/24 1056  NA 137  K 4.2  CL 101  CO2 25  GLUCOSE 188*  BUN 21  CREATININE 0.89  CALCIUM 8.7*   GFR: Estimated Creatinine Clearance: 40.9 mL/min (by C-G formula based on SCr of 0.89 mg/dL). Liver Function Tests: Recent Labs  Lab 07/23/24 1056  AST 15  ALT 11  ALKPHOS 53  BILITOT 0.7  PROT 5.5*  ALBUMIN 3.1*   No results for input(s): LIPASE, AMYLASE in the last 168 hours. No results for input(s): AMMONIA in the last 168 hours. Coagulation Profile: No  results for input(s): INR, PROTIME in the last 168 hours. Cardiac Enzymes: No results for input(s): CKTOTAL, CKMB, CKMBINDEX, TROPONINI in the last 168 hours. BNP (last 3 results) No results for input(s): PROBNP in the last 8760 hours. HbA1C: No results for input(s): HGBA1C in the last 72 hours. CBG: Recent Labs  Lab 07/23/24 1337  GLUCAP 122*   Lipid Profile: No results for input(s): CHOL, HDL, LDLCALC, TRIG, CHOLHDL, LDLDIRECT in the last 72 hours. Thyroid Function Tests: No results for input(s): TSH, T4TOTAL, FREET4, T3FREE, THYROIDAB in the last 72 hours. Anemia Panel: No results for input(s): VITAMINB12, FOLATE, FERRITIN, TIBC, IRON, RETICCTPCT in the last 72 hours. Urine analysis:    Component Value Date/Time   COLORURINE YELLOW (A) 07/23/2024 1403   APPEARANCEUR CLEAR (A) 07/23/2024 1403   LABSPEC 1.016 07/23/2024 1403   PHURINE 5.0 07/23/2024 1403   GLUCOSEU NEGATIVE 07/23/2024 1403   HGBUR NEGATIVE 07/23/2024 1403   BILIRUBINUR NEGATIVE 07/23/2024 1403   KETONESUR NEGATIVE 07/23/2024 1403   PROTEINUR NEGATIVE 07/23/2024 1403   NITRITE NEGATIVE 07/23/2024 1403   LEUKOCYTESUR NEGATIVE 07/23/2024 1403    Radiological Exams on Admission: CT HEAD WO CONTRAST ( ) Result Date: 07/23/2024 CLINICAL DATA:  Vertigo, central. Vomiting. Syncopal episode. Dizziness. EXAM: CT HEAD WITHOUT CONTRAST TECHNIQUE: Contiguous axial images were obtained from the base of the skull through the vertex without intravenous contrast. RADIATION DOSE REDUCTION: This exam was performed according to the departmental dose-optimization program which includes automated exposure control, adjustment of the mA and/or kV according to patient size and/or use of iterative reconstruction technique. COMPARISON:  MRI 08/05/2023 FINDINGS: Brain: Age related atrophy. Advanced chronic small-vessel ischemic changes affecting the pons and cerebral hemispheric white  matter. No large vessel territory stroke. No intra-axial mass lesion. 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 Slight indentation of the lateral aspect of the right hemisphere but without evidence of mass effect or brain edema. Chronic ventriculomegaly probably due to central atrophy, not significantly changed. No extra-axial fluid collection. Vascular: There is atherosclerotic calcification of the major vessels at the base of the brain. Skull: Negative Sinuses/Orbits: Clear/normal Other: None IMPRESSION: 1. No acute CT finding. Age related atrophy. Advanced chronic small-vessel ischemic changes of the pons and  cerebral hemispheric white matter. 2. 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 Slight indentation of the lateral aspect of the right hemisphere but without evidence of mass effect or brain edema. 3. Chronic ventriculomegaly probably due to central atrophy, not significantly changed. Electronically Signed   By: Oneil Officer M.D.   On: 07/23/2024 12:39    EKG: Independently reviewed.  Sinus rhythm, frequent PVCs, no acute ST changes.  Assessment/Plan Principal Problem:   Aspiration pneumonia (HCC)  (please populate well all problems here in Problem List. (For example, if patient is on BP meds at home and you resume or decide to hold them, it is a problem that needs to be her. Same for CAD, COPD, HLD and so on)  Sepsis without acute endorgan damage - This is evidenced by hypoxia tachycardia leukocytosis, and source of infection is bilateral pneumonia likely aspiration pneumonia. - Continue Unasyn  - Check lactic acid, blood culture sent  Acute hypoxic respiratory failure -Consult RT, changed to high flow oxygen - Antibiotics as above - DuoNebs - Incentive spirometry  Syncope, likely vasovagal -  Secondary to nauseous vomiting - Resolved. - CT head reassuring  Leukocytosis History of CLL - Likely multifactorial from sepsis and possible leukemoid reaction from recent steroid use. - Clinically low suspicion for ALL transition, will check peripheral smear  Nauseous vomiting - Resolved, abdominal exam benign.  Check abdominal x-ray - As needed Zofran  - Recent EGD results reviewed,  IIDM with hyperglycemia -SSI  History of dysphagia secondary to Schatzki's ring - Status post EGD and dilation recently - Stable, no complaints, unlikely to cause her GI symptoms today  Total time spent on patient care 75 minutes.  DVT prophylaxis: Lovenox  Code Status: Full code Family Communication: Daughter at bedside Disposition Plan: Patient is sick with sepsis/pneumonia hypoxic requiring IV antibiotics, expect more than 2 midnight hospital stay Consults called: None Admission status: PCU admit   Cort Melissa Mana MD [facility] Pager [phone]  07/23/2024, 3:20 PM

## 2024-07-23 NOTE — ED Triage Notes (Signed)
 Pt to ED via [facility] from home. Pt called out for vomiting and syncopal episode this morning. Upon EMS arrival pt became dizzy, pale, and diaphoretic upon standing. EMS gave 500mL fluid. Initial pressure 95/60. Denies fever, CP, SOB.  EMS vitals  HR 92 BP 118/64 after NS SPO2 95%

## 2024-07-23 NOTE — ED Notes (Signed)
 X-ray at bedside.

## 2024-07-23 NOTE — ED Notes (Signed)
 CCMD called to place pt on monitor

## 2024-07-23 NOTE — ED Notes (Signed)
 Respirtatory therapy called to place pt on HFNC. MD made aware.

## 2024-07-23 NOTE — Significant Event (Addendum)
       CROSS COVER NOTE  NAME: Melissa Curtis MRN: [id number] DOB : [birth year] ATTENDING PHYSICIAN: Melissa Cort DASEN, MD    Date of Service   07/23/2024   HPI/Events of Note   Message received from nurse with report of lactic acid 2.9.  Patient admitted earlier today with sepsis from PNA, likely aspiration and is on Unasyn .  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 Viktoria Curtis  Meningioma (CMS/HHS-HCC)  Osteopenia  Palpitations  PONV (postoperative nausea and vomiting)  PVC (premature ventricular contraction)  Sleep apnea 2010  Does not use CPAP  Squamous cell carcinoma of forehead  Tubular adenoma of colon 10/20/2015  10/2015 colonoscopy   In addition Review of outpatient cardiac notes from 05/25/2024 Cardiac Problem List: CAD - coronary atherosclerosis by CT scan 10/13/2015 at [facility] - aortic atherosclerosis with calcification within the LAD - she takes aspirin  81mg  daily and pravastatin  20mg  daily HLD - she takes pravastatin  20mg  daily. Last LDL 69 mg/dL 5/69/74. HTN - diltiazem  CD 120 mg qd, stable CMP 03/04/24 Stage 3a CKD - stable CMP 03/04/24 Valvular regurgitation - mild AR, PR on 03/13/23 ECHO, trivial MR and TR; no valvular stenosis Mildly dilated ascending aorta - measured 3.4 cm on ECHO 03/13/23 Frequent PVCs (8% burden on Holter) actual echo report not located in ECHO  Patient  received 1 L of NS while in ER  Labs reviewed. Significant for leukocytosis with lymphocyte dominence consistent with her CLL Chest xray and KUB without acute findings  Interventions   Assessment/Plan:    07/23/2024    8:00 PM 07/23/2024    7:38 PM 07/23/2024    7:00 PM  Vitals with BMI  Systolic 116 93 89  Diastolic 63 69 72  Pulse  107    Nurse reports patient eating and drinking well  Given valvular disease specifically aortic regurg will hold additional fluids and continue close hemodynamic monitoring Continue to trend lactic acid   update at 1131 PM lactic acid remains 2.6, bp trending down  Secure chat message received  Melissa Curtis blood pressure is very soft 78/44 and I rechecked it and it was even lower- MAP-59. Her heart rate also is low with at beginning of shift 110's and now 62. She is arousable and still oriented but everything is trending down.  No increase in oxygen requirements  Lactated ringer  250 ml bolus followed by 75 ml/h continuous Repeat lactic 0300 Resp panel - pending Ddimer  -  0.95 not significant elevation Cortisol level  - 34.3 - may need further eval/endocrine referral Dayteam to reevaluate extended release diltiazem  (I have discontinued for now) and cardiology involvement may be needed     Melissa LITTIE Cone NP [facility] Cross Cover 7pm-7am - check amion for availability Pager [phone]

## 2024-07-23 NOTE — Significant Event (Incomplete Revision)
       CROSS COVER NOTE  NAME: Melissa Curtis MRN: [id number] DOB : 12/10/1940 ATTENDING PHYSICIAN: Laurita Cort DASEN, MD    Date of Service   07/23/2024   HPI/Events of Note   Message received from nurse with report of lactic acid 2.9.  Patient admitted earlier today with sepsis from PNA, likely aspiration and is on Unasyn .  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 Viktoria Glenn  Meningioma (CMS/HHS-HCC)  Osteopenia  Palpitations  PONV (postoperative nausea and vomiting)  PVC (premature ventricular contraction)  Sleep apnea 2010  Does not use CPAP  Squamous cell carcinoma of forehead  Tubular adenoma of colon 10/20/2015  10/2015 colonoscopy   In addition Review of outpatient cardiac notes from 05/25/2024 Cardiac Problem List: CAD - coronary atherosclerosis by CT scan 10/13/2015 at [facility] - aortic atherosclerosis with calcification within the LAD - she takes aspirin  81mg  daily and pravastatin  20mg  daily HLD - she takes pravastatin  20mg  daily. Last LDL 69 mg/dL 5/69/74. HTN - diltiazem  CD 120 mg qd, stable CMP 03/04/24 Stage 3a CKD - stable CMP 03/04/24 Valvular regurgitation - mild AR, PR on 03/13/23 ECHO, trivial MR and TR; no valvular stenosis Mildly dilated ascending aorta - measured 3.4 cm on ECHO 03/13/23 Frequent PVCs (8% burden on Holter) actual echo report not located in ECHO  Patient  received 1 L of NS while in ER  Labs reviewed. Significant for leukocytosis consistent with her CLL  Interventions   Assessment/Plan:    07/23/2024    8:00 PM 07/23/2024    7:38 PM 07/23/2024    7:00 PM  Vitals with BMI  Systolic 116 93 89  Diastolic 63 69 72  Pulse  107    Nurse reports patient eating and drinking well  Given valvular disease specifically aortic regurg will hold additional fluids and continue close hemodynamic monitoring Continue to trend lactic acid   update at 1131 PM lactic acid remains 2.6, bp trending down     07/23/2024    8:00 PM 07/23/2024    7:38 PM 07/23/2024    7:00 PM  Vitals with BMI  Systolic 116 93 89  Diastolic 63 69 72  Pulse  107      Lactated ringer  250 ml bolus followed by 75 ml/h continuous Repeat lactic 0300 Dayteam to reevaluate ordered cardizem  and cardiology involvement may be needed     Erminio LITTIE Cone NP [facility] Cross Cover 7pm-7am - check amion for availability Pager [phone]

## 2024-07-23 NOTE — ED Provider Notes (Signed)
 [facility] Provider Note    Event Date/Time   First MD Initiated Contact with Patient 07/23/24 1055     (approximate)  History   Chief Complaint: Dizziness  HPI  Melissa Curtis is a 83 y.o. female with a past medical history of diabetes, CLL, gastric reflux, hypertension, diabetes, presents to the emergency department for dizziness nausea and vomiting.  According to the patient for the past 2 weeks she has been dealing with bursitis of her left hip, and is currently following up with an orthopedic and is taking her second course of steroids/prednisone .  Patient states today she has been feeling nauseated was having vomiting this morning and felt lightheaded called EMS.  EMS states initial blood pressure of 95/60 received 500 cc of fluid.  Physical Exam   Triage Vital Signs: ED Triage Vitals [07/23/24 1056]  Encounter Vitals Group     BP 103/66     Girls Systolic BP Percentile      Girls Diastolic BP Percentile      Boys Systolic BP Percentile      Boys Diastolic BP Percentile      Pulse Rate 86     Resp 14     Temp 98.2 F (36.8 C)     Temp Source Oral     SpO2 95 %     Weight 138 lb (62.6 kg)     Height 5' 2 (1.575 m)     Head Circumference      Peak Flow      Pain Score 0     Pain Loc      Pain Education      Exclude from Growth Chart     Most recent vital signs: Vitals:   07/23/24 1105 07/23/24 1108  BP:    Pulse: 86 84  Resp: 19 18  Temp:    SpO2: 94% 97%    General: Awake, no distress.  Dried vomitus on her shirt. CV:  Good peripheral perfusion.  Regular rate and rhythm  Resp:  Normal effort.  Equal breath sounds bilaterally.  Abd:  No distention.  Soft, nontender.  No rebound or guarding.   ED Results / Procedures / Treatments   EKG  EKG viewed and interpreted by myself shows a sinus rhythm 85 bpm with a narrow QRS, normal axis, normal intervals, nonspecific ST changes.  Frequent PVC.  RADIOLOGY  I have reviewed  interpreted chest x-ray images.  Patient has opacities in bilateral lower lungs versus atelectasis. CT scan of the head is negative for acute abnormality.   MEDICATIONS ORDERED IN ED: Medications  sodium chloride  0.9 % bolus 1,000 mL (1,000 mLs Intravenous New Bag/Given 07/23/24 1101)  ondansetron  (ZOFRAN ) injection 4 mg (4 mg Intravenous Given 07/23/24 1101)     IMPRESSION / MDM / ASSESSMENT AND PLAN / ED COURSE  I reviewed the triage vital signs and the nursing notes.  Patient's presentation is most consistent with acute presentation with potential threat to life or bodily function.  Patient presents the emergency department for nausea vomiting lightheadedness this morning.  Patient states she is feeling much better, EMS states initial blood pressure in the 90s, received 500 cc of IV fluids and currently 111/69.  Vital signs otherwise reassuring as well.  We will check labs we will continue with IV hydration treat nausea and continue to closely monitor.  Patient CBC is resulted showing significant leukocytosis of 46,000 however the patient has a history of CLL and is currently  on prednisone .  Patient's chemistry shows no significant findings.  Troponin is reassuringly negative.  EKG does show PVCs we will continue to closely monitor on telemetry.      FINAL CLINICAL IMPRESSION(S) / ED DIAGNOSES   Dizziness Nausea vomiting Aspiration  Note:  This document was prepared using Dragon voice recognition software and may include unintentional dictation errors.   Dorothyann Drivers, MD 07/23/24 [phone]

## 2024-07-24 DIAGNOSIS — A419 Sepsis, unspecified organism: Secondary | ICD-10-CM

## 2024-07-24 LAB — CBC
HCT: 33.8 % — ABNORMAL LOW (ref 36.0–46.0)
Hemoglobin: 11.3 g/dL — ABNORMAL LOW (ref 12.0–15.0)
MCH: 33.1 pg (ref 26.0–34.0)
MCHC: 33.4 g/dL (ref 30.0–36.0)
MCV: 99.1 fL (ref 80.0–100.0)
Platelets: 133 K/uL — ABNORMAL LOW (ref 150–400)
RBC: 3.41 MIL/uL — ABNORMAL LOW (ref 3.87–5.11)
RDW: 14 % (ref 11.5–15.5)
WBC: 48 K/uL — ABNORMAL HIGH (ref 4.0–10.5)
nRBC: 0.1 % (ref 0.0–0.2)

## 2024-07-24 LAB — GLUCOSE, CAPILLARY
Glucose-Capillary: 149 mg/dL — ABNORMAL HIGH (ref 70–99)
Glucose-Capillary: 253 mg/dL — ABNORMAL HIGH (ref 70–99)
Glucose-Capillary: 99 mg/dL (ref 70–99)

## 2024-07-24 LAB — LACTIC ACID, PLASMA: Lactic Acid, Venous: 1.4 mmol/L (ref 0.5–1.9)

## 2024-07-24 LAB — BLOOD GAS, VENOUS
Acid-Base Excess: 1.3 mmol/L (ref 0.0–2.0)
Bicarbonate: 27.2 mmol/L (ref 20.0–28.0)
O2 Content: 8 L/min
O2 Saturation: 78.2 %
Patient temperature: 37
pCO2, Ven: 47 mmHg (ref 44–60)
pH, Ven: 7.37 (ref 7.25–7.43)
pO2, Ven: 46 mmHg — ABNORMAL HIGH (ref 32–45)

## 2024-07-24 LAB — D-DIMER, QUANTITATIVE: D-Dimer, Quant: 0.95 ug{FEU}/mL — ABNORMAL HIGH (ref 0.00–0.50)

## 2024-07-24 LAB — RESPIRATORY PANEL BY PCR

## 2024-07-24 LAB — BRAIN NATRIURETIC PEPTIDE: B Natriuretic Peptide: 146.5 pg/mL — ABNORMAL HIGH (ref 0.0–100.0)

## 2024-07-24 LAB — CORTISOL: Cortisol, Plasma: 34.3 ug/dL

## 2024-07-24 MED ORDER — MONTELUKAST SODIUM 10 MG PO TABS
10.0000 mg | ORAL_TABLET | Freq: Every day | ORAL | Status: DC
  Administered 2024-07-24 – 2024-07-27 (×4): 10 mg via ORAL
  Filled 2024-07-24 (×4): qty 1

## 2024-07-24 MED ORDER — IPRATROPIUM-ALBUTEROL 0.5-2.5 (3) MG/3ML IN SOLN
3.0000 mL | Freq: Two times a day (BID) | RESPIRATORY_TRACT | Status: DC
  Administered 2024-07-24 – 2024-07-27 (×6): 3 mL via RESPIRATORY_TRACT
  Filled 2024-07-24 (×6): qty 3

## 2024-07-24 MED ORDER — MIDODRINE HCL 5 MG PO TABS
20.0000 mg | ORAL_TABLET | Freq: Once | ORAL | Status: AC
  Administered 2024-07-24: 20 mg via ORAL
  Filled 2024-07-24: qty 4

## 2024-07-24 NOTE — Evaluation (Signed)
 Physical Therapy Evaluation Patient Details Name: Melissa Curtis MRN: [id number] DOB: 1941-07-19 Today's Date: 07/24/2024  History of Present Illness  Pt is an 83 y.o. female presenting to hospital 07/23/24 with c/o dizziness, nausea, and vomiting.  Per chart pt has been dealing with L hip bursitis past 2 weeks.  Pt admitted with sepsis, acute hypoxic respiratory failure, syncope (likely vasovagal), leukocytosis, N/V.  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  Clinical Impression  Prior to recent medical concerns, pt reports being ambulatory with RW use; lives with her granddaughter; bedroom on 2nd floor.  L hip pain 2/10 during session (nurse notified).  Currently pt is SBA with bed mobility; CGA with transfer from bed; and CGA to side step along bed a few feet with RW use.  SpO2 sats 93% or greater on 5 L O2 via nasal cannula during session (mild SOB noted with activities; pt reports not using supplemental O2 at baseline).  Pt would currently benefit from skilled PT to address noted impairments and functional limitations (see below for any additional details).  Upon hospital discharge, pt would benefit from ongoing therapy.     If plan is discharge home, recommend the following: A little help with walking and/or transfers;A little help with bathing/dressing/bathroom;Assistance with cooking/housework;Assist for transportation;Help with stairs or ramp for entrance   Can travel by private vehicle    Yes    Equipment Recommendations Rolling walker (2 wheels) (Pt has RW at home already)  Recommendations for Other Services       Functional Status Assessment Patient has had a recent decline in their functional status and demonstrates the ability to make significant improvements in function in a reasonable and predictable amount of time.     Precautions /  Restrictions Precautions Precautions: Fall Recall of Precautions/Restrictions: Intact Restrictions Weight Bearing Restrictions Per Provider Order: No      Mobility  Bed Mobility Overal bed mobility: Needs Assistance Bed Mobility: Supine to Sit, Sit to Supine     Supine to sit: Supervision, HOB elevated Sit to supine: Supervision, HOB elevated   General bed mobility comments: mild increased effort to perform on own; SBA for lines    Transfers Overall transfer level: Needs assistance Equipment used: Rolling walker (2 wheels) Transfers: Sit to/from Stand Sit to Stand: Contact guard assist           General transfer comment: CGA to stand from bed; vc's for UE placement    Ambulation/Gait Ambulation/Gait assistance: Contact guard assist Gait Distance (Feet):  (pt able to side step along bed a few feet) Assistive device: Rolling walker (2 wheels)   Gait velocity: decreased     General Gait Details: steady taking steps along bed; mild increased effort to take steps  Stairs            Wheelchair Mobility     Tilt Bed    Modified Rankin (Stroke Patients Only)       Balance Overall balance assessment: Needs assistance Sitting-balance support: No upper extremity supported, Feet supported Sitting balance-Leahy Scale: Good Sitting balance - Comments: steady in sitting reaching within BOS   Standing balance support: Bilateral upper extremity supported, Reliant on assistive device for balance Standing balance-Leahy Scale: Fair Standing balance comment: steady static standing with B UE support on RW  Pertinent Vitals/Pain Pain Assessment Pain Assessment: 0-10 Pain Score: 2  Pain Location: L hip Pain Descriptors / Indicators: Tender, Sore Pain Intervention(s): Limited activity within patient's tolerance, Monitored during session, Repositioned, Other (comment) (RN notified regarding pt's pain status) HR 70's to 90's  bpm during session.    Home Living Family/patient expects to be discharged to:: Private residence Living Arrangements: Other relatives (56 y.o. granddaughter) Available Help at Discharge: Family;Available PRN/intermittently Type of Home: House Home Access: Stairs to enter Entrance Stairs-Rails: Right;Left;Can reach both Entrance Stairs-Number of Steps: 3 Alternate Level Stairs-Number of Steps: 15 Home Layout: Two level;Bed/bath upstairs Home Equipment: Rolling Walker (2 wheels);Cane - single point;BSC/3in1;Shower seat Additional Comments: Pt plans to order grab bars for toilets.    Prior Function Prior Level of Function : Independent/Modified Independent             Mobility Comments: Pt has been receiving HHPT and HHOT last 2-3 weeks (1-2x's/week)       Extremity/Trunk Assessment   Upper Extremity Assessment Upper Extremity Assessment: Generalized weakness    Lower Extremity Assessment Lower Extremity Assessment: Generalized weakness    Cervical / Trunk Assessment Cervical / Trunk Assessment: Normal  Communication   Communication Communication: No apparent difficulties    Cognition Arousal: Alert Behavior During Therapy: WFL for tasks assessed/performed   PT - Cognitive impairments: No apparent impairments                         Following commands: Intact       Cueing Cueing Techniques: Verbal cues     General Comments  Nursing cleared pt for participation in physical therapy.  Pt agreeable to PT session.  NT present during session assisting with bed linen change and pt peri-care d/t bed being wet (pure-wick did not appear to be working).    Exercises     Assessment/Plan    PT Assessment Patient needs continued PT services  PT Problem List Decreased strength;Decreased activity tolerance;Decreased balance;Decreased mobility;Cardiopulmonary status limiting activity;Pain       PT Treatment Interventions DME instruction;Gait training;Stair  training;Functional mobility training;Therapeutic activities;Therapeutic exercise;Balance training;Patient/family education    PT Goals (Current goals can be found in the Care Plan section)  Acute Rehab PT Goals Patient Stated Goal: to improve strength and walking PT Goal Formulation: With patient Time For Goal Achievement: 08/07/24 Potential to Achieve Goals: Good    Frequency Min 3X/week     Co-evaluation               AM-PAC PT 6 Clicks Mobility  Outcome Measure Help needed turning from your back to your side while in a flat bed without using bedrails?: None Help needed moving from lying on your back to sitting on the side of a flat bed without using bedrails?: A Little Help needed moving to and from a bed to a chair (including a wheelchair)?: A Little Help needed standing up from a chair using your arms (e.g., wheelchair or bedside chair)?: A Little Help needed to walk in hospital room?: A Little Help needed climbing 3-5 steps with a railing? : A Lot 6 Click Score: 18    End of Session Equipment Utilized During Treatment: Gait belt Activity Tolerance: Patient tolerated treatment well Patient left: in bed;with call bell/phone within reach;with bed alarm set;with nursing/sitter in room Nurse Communication: Mobility status;Precautions;Other (comment) (Pt's L hip pain and SpO2 sats during session) PT Visit Diagnosis: Other abnormalities of gait and mobility (R26.89);Muscle weakness (generalized) (M62.81);Pain  Pain - Right/Left: Left Pain - part of body: Hip    Time: 8555-8488 PT Time Calculation (min) (ACUTE ONLY): 27 min   Charges:   PT Evaluation $PT Eval Low Complexity: 1 Low PT Treatments $Therapeutic Activity: 8-22 mins PT General Charges $$ ACUTE PT VISIT: 1 Visit        Damien Caulk, PT 07/24/24, 4:47 PM

## 2024-07-24 NOTE — Progress Notes (Signed)
 PROGRESS NOTE    Melissa Curtis  FMW:[id number] DOB: 04-09-1941 DOA: 07/23/2024 PCP: Zachary Idelia LABOR, MD  Chief Complaint  Patient presents with   Dizziness    Hospital Course:  Melissa Curtis is a 83 y.o. 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 Patient admitted for sepsis due to aspiration pneumonia and acute hypoxic respiratory failure.  Hospital course as below  Subjective: Patient was examined at the bedside, granddaughter present. Patient reports feeling much better today, denies any complaints except nausea Discussed updates at the bedside and answered all questions   Objective: Vitals:   07/24/24 0717 07/24/24 0730 07/24/24 0800 07/24/24 0900  BP:  (!) 111/101 (!) 149/66 (!) 113/59  Pulse:  88 94 77  Resp:  19 (!) 21 20  Temp:   98 F (36.7 C)   TempSrc:   Oral   SpO2: 95% 100% 92% 94%  Weight:      Height:        Intake/Output Summary (Last 24 hours) at 07/24/2024 0924 Last data filed at 07/24/2024 0700 Gross per 24 hour  Intake 697.8 ml  Output 500 ml  Net 197.8 ml   Filed Weights   07/23/24 1056 07/23/24 1812  Weight: 62.6 kg 62.5 kg    Examination: Constitutional: NAD, calm, comfortable  Neck: normal, supple, no masses, no thyromegaly Respiratory: clear to auscultation bilaterally, no wheezing, increasing crackles on right lower field, No inc WOB. No accessory muscle use.  Cardiovascular: Regular rate and rhythm, no murmurs / rubs / gallops. No extremity edema. 2+ pedal pulses Abdomen: no tenderness, no masses palpated. No hepatosplenomegaly. Bowel sounds positive.  Musculoskeletal: no clubbing / cyanosis. No joint deformity upper and lower extremities. Good ROM, no contractures. Normal muscle tone.  Skin: no rashes, lesions, ulcers. No  induration Neurologic: CN 2-12 grossly intact. Sensation intact, DTR normal. Strength 5/5 in all 4.   Assessment & Plan:  Principal Problem:   Aspiration pneumonia (HCC) Active Problems:   Sepsis (HCC)  Aspiration pneumonia Sepsis without acute endorgan damage - Presented with hypoxia, tachycardia, leukocytosis, lactic acidosis, and source of infection is bilateral pneumonia likely aspiration pneumonia. - Lactic acidosis resolved - Leukocytosis 46.3 -> 48.0 - COVID/Flu/RSV negative, Chest x-ray showed bilateral lower fields infiltrates likely aspiration pneumonia - d-dimer 0.95 (age adjusted d-dimer < 0.84) - symptoms improving (tachycardia resolved, hypotension and hypoxia improving) - consider CTA if suspicion high/ worsening - Continue Unasyn , discontinue IV fluids - Follow blood cultures, Rcx, Resp viral panel pending - SLP eval   Acute hypoxic respiratory failure - Consult RT, on high flow oxygen - Antibiotics as above - DuoNebs prn, Incentive spirometry   Syncope, likely vasovagal - Secondary to nauseous vomiting - Resolved. - CT head no acute findings.  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  No evidence of mass effect or brain edema.   Leukocytosis History of CLL - Likely multifactorial from sepsis and possible leukemoid reaction from recent steroid use. - Monitor wbc   Nauseous vomiting - Resolved, abdominal exam benign.  Abd Xray neg - As needed Zofran  - Recent EGD results reviewed   IIDM with hyperglycemia - HbA1c 6.9 SSI   History of dysphagia secondary to Schatzki's ring - Status post EGD and dilation recently - Stable, no complaints, unlikely to cause her GI symptoms today  CAD -  On asa, statin  HTN - Diltiazem , Losartan  held due to hypotension  -- PT eval pending  DVT prophylaxis: Lovenox  SQ   Code Status: Full Code Disposition:  TBD  Consultants:  None  Procedures:  None  Antimicrobials:  Anti-infectives  (From admission, onward)    Start     Dose/Rate Route Frequency Ordered Stop   07/23/24 2200  Ampicillin -Sulbactam (UNASYN ) 3 g in sodium chloride  0.9 % 100 mL IVPB        3 g 200 mL/hr over 30 Minutes Intravenous Every 6 hours 07/23/24 1545     07/23/24 1445  Ampicillin -Sulbactam (UNASYN ) 3 g in sodium chloride  0.9 % 100 mL IVPB        3 g 200 mL/hr over 30 Minutes Intravenous  Once 07/23/24 1434 07/23/24 1558       Data Reviewed: I have personally reviewed following labs and imaging studies CBC: Recent Labs  Lab 07/23/24 1056 07/23/24 1515 07/24/24 0426  WBC 46.3* 41.3* 48.0*  NEUTROABS  --  4.8  --   HGB 13.0 13.4 11.3*  HCT 40.5 41.5 33.8*  MCV 101.0* 100.5* 99.1  PLT 156 150 133*   Basic Metabolic Panel: Recent Labs  Lab 07/23/24 1056  NA 137  K 4.2  CL 101  CO2 25  GLUCOSE 188*  BUN 21  CREATININE 0.89  CALCIUM 8.7*   GFR: Estimated Creatinine Clearance: 40.9 mL/min (by C-G formula based on SCr of 0.89 mg/dL). Liver Function Tests: Recent Labs  Lab 07/23/24 1056  AST 15  ALT 11  ALKPHOS 53  BILITOT 0.7  PROT 5.5*  ALBUMIN 3.1*   CBG: Recent Labs  Lab 07/23/24 1337 07/23/24 1710 07/23/24 2119 07/24/24 0740  GLUCAP 122* 113* 170* 149*    Recent Results (from the past 240 hours)  Resp panel by RT-PCR (RSV, Flu A&B, Covid) Anterior Nasal Swab     Status: None   Collection Time: 07/23/24  1:48 PM   Specimen: Anterior Nasal Swab  Result Value Ref Range Status   SARS Coronavirus 2 by RT PCR NEGATIVE NEGATIVE Final    Comment: (NOTE) SARS-CoV-2 target nucleic acids are NOT DETECTED.  The SARS-CoV-2 RNA is generally detectable in upper respiratory specimens during the acute phase of infection. The lowest concentration of SARS-CoV-2 viral copies this assay can detect is 138 copies/mL. A negative result does not preclude SARS-Cov-2 infection and should not be used as the sole basis for treatment or other patient management decisions. A negative  result may occur with  improper specimen collection/handling, submission of specimen other than nasopharyngeal swab, presence of viral mutation(s) within the areas targeted by this assay, and inadequate number of viral copies(<138 copies/mL). A negative result must be combined with clinical observations, patient history, and epidemiological information. The expected result is Negative.  Fact Sheet for Patients:  BloggerCourse.com  Fact Sheet for Healthcare Providers:  SeriousBroker.it  This test is no t yet approved or cleared by the [place]  FDA and  has been authorized for detection and/or diagnosis of SARS-CoV-2 by FDA under an Emergency Use Authorization (EUA). This EUA will remain  in effect (meaning this test can be used) for the duration of the COVID-19 declaration under Section 564(b)(1) of the Act, 21 U.S.C.section 360bbb-3(b)(1), unless the authorization is terminated  or revoked sooner.       Influenza A by PCR NEGATIVE NEGATIVE Final   Influenza B by PCR NEGATIVE NEGATIVE Final    Comment: (NOTE) The Xpert Xpress SARS-CoV-2/FLU/RSV plus assay  is intended as an aid in the diagnosis of influenza from Nasopharyngeal swab specimens and should not be used as a sole basis for treatment. Nasal washings and aspirates are unacceptable for Xpert Xpress SARS-CoV-2/FLU/RSV testing.  Fact Sheet for Patients: BloggerCourse.com  Fact Sheet for Healthcare Providers: SeriousBroker.it  This test is not yet approved or cleared by the [place]  FDA and has been authorized for detection and/or diagnosis of SARS-CoV-2 by FDA under an Emergency Use Authorization (EUA). This EUA will remain in effect (meaning this test can be used) for the duration of the COVID-19 declaration under Section 564(b)(1) of the Act, 21 U.S.C. section 360bbb-3(b)(1), unless the authorization is  terminated or revoked.     Resp Syncytial Virus by PCR NEGATIVE NEGATIVE Final    Comment: (NOTE) Fact Sheet for Patients: BloggerCourse.com  Fact Sheet for Healthcare Providers: SeriousBroker.it  This test is not yet approved or cleared by the [place]  FDA and has been authorized for detection and/or diagnosis of SARS-CoV-2 by FDA under an Emergency Use Authorization (EUA). This EUA will remain in effect (meaning this test can be used) for the duration of the COVID-19 declaration under Section 564(b)(1) of the Act, 21 U.S.C. section 360bbb-3(b)(1), unless the authorization is terminated or revoked.  Performed at [facility], [address] [place], [place] [address]   Culture, blood (Routine X 2) w Reflex to ID Panel     Status: None (Preliminary result)   Collection Time: 07/23/24  5:46 PM   Specimen: BLOOD  Result Value Ref Range Status   Specimen Description BLOOD BLOOD RIGHT HAND  Final   Special Requests   Final    BOTTLES DRAWN AEROBIC AND ANAEROBIC Blood Culture results may not be optimal due to an inadequate volume of blood received in culture bottles   Culture   Final    NO GROWTH < 24 HOURS Performed at [facility], [address] [place], [place] [address]    Report Status PENDING  Incomplete  Culture, blood (Routine X 2) w Reflex to ID Panel     Status: None (Preliminary result)   Collection Time: 07/23/24  5:50 PM   Specimen: BLOOD  Result Value Ref Range Status   Specimen Description BLOOD LEFT ANTECUBITAL  Final   Special Requests   Final    BOTTLES DRAWN AEROBIC AND ANAEROBIC Blood Culture results may not be optimal due to an inadequate volume of blood received in culture bottles   Culture   Final    NO GROWTH < 24 HOURS Performed at [facility], [address] [place], [place] [address]    Report Status PENDING  Incomplete  MRSA Next Gen by PCR, Nasal     Status:  None   Collection Time: 07/23/24  6:14 PM   Specimen: Nasal Mucosa; Nasal Swab  Result Value Ref Range Status   MRSA by PCR Next Gen NOT DETECTED NOT DETECTED Final    Comment: (NOTE) The GeneXpert MRSA Assay (FDA approved for NASAL specimens only), is one component of a comprehensive MRSA colonization surveillance program. It is not intended to diagnose MRSA infection nor to guide or monitor treatment for MRSA infections. Test performance is not FDA approved in patients less than 7 years old. Performed at [facility], [address] [place], [place] [address]      Radiology Studies: DG Abd 2 Views Result Date: 07/23/2024 CLINICAL DATA:  Nausea and vomiting. EXAM: ABDOMEN - 2 VIEW COMPARISON:  CT abdomen and  pelvis 10/13/2015 FINDINGS: Scattered gas and stool in the colon. No small or large bowel distention. No radiopaque stones. Degenerative changes in the spine and hips. Old fracture deformities of the right hemipelvis. Suggestion of infiltration or atelectasis in the lung bases. Soft tissue contours appear intact. IMPRESSION: Normal nonobstructive bowel gas pattern. Electronically Signed   By: Elsie Gravely M.D.   On: 07/23/2024 16:18   DG Chest Portable 1 View Result Date: 07/23/2024 CLINICAL DATA:  Dizziness.  Syncope. EXAM: PORTABLE CHEST 1 VIEW COMPARISON:  March 17, 2018. FINDINGS: The heart size and mediastinal contours are within normal limits. Both lungs are clear. The visualized skeletal structures are unremarkable. IMPRESSION: No active disease. Electronically Signed   By: Lynwood Landy Raddle M.D.   On: 07/23/2024 15:45   CT HEAD WO CONTRAST ( ) Result Date: 07/23/2024 CLINICAL DATA:  Vertigo, central. Vomiting. Syncopal episode. Dizziness. EXAM: CT HEAD WITHOUT CONTRAST TECHNIQUE: Contiguous axial images were obtained from the base of the skull through the vertex without intravenous contrast. RADIATION DOSE REDUCTION: This exam was performed according to the  departmental dose-optimization program which includes automated exposure control, adjustment of the mA and/or kV according to patient size and/or use of iterative reconstruction technique. COMPARISON:  MRI 08/05/2023 FINDINGS: Brain: Age related atrophy. Advanced chronic small-vessel ischemic changes affecting the pons and cerebral hemispheric white matter. No large vessel territory stroke. No intra-axial mass lesion. 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 Slight indentation of the lateral aspect of the right hemisphere but without evidence of mass effect or brain edema. Chronic ventriculomegaly probably due to central atrophy, not significantly changed. No extra-axial fluid collection. Vascular: There is atherosclerotic calcification of the major vessels at the base of the brain. Skull: Negative Sinuses/Orbits: Clear/normal Other: None IMPRESSION: 1. No acute CT finding. Age related atrophy. Advanced chronic small-vessel ischemic changes of the pons and cerebral hemispheric white matter. 2. 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 Slight indentation of the lateral aspect of the right hemisphere but without evidence of mass effect or brain edema. 3. Chronic ventriculomegaly probably due to central atrophy, not significantly changed. Electronically Signed   By: Oneil Officer M.D.   On: 07/23/2024 12:39    Scheduled Meds:  aspirin  EC  81 mg Oral Daily   Chlorhexidine  Gluconate Cloth  6 each Topical Q0600   enoxaparin  (LOVENOX ) injection  40 mg Subcutaneous Q24H   guaiFENesin   1,200 mg Oral BID   insulin  aspart  0-9 Units Subcutaneous TID WC   ipratropium-albuterol   3 mL Nebulization Q6H   loratadine   10 mg Oral Daily   pantoprazole   40 mg Oral Daily   pravastatin   10 mg Oral QHS   traZODone   100 mg Oral QHS    venlafaxine  XR  75 mg Oral Daily   Continuous Infusions:  ampicillin -sulbactam (UNASYN ) IV Stopped (07/24/24 0534)   lactated ringers  75 mL/hr at 07/24/24 0700     LOS: 1 day  MDM: Patient is high risk for one or more organ failure.  They necessitate ongoing hospitalization for continued IV therapies and subsequent lab monitoring. Total time spent interpreting labs and vitals, reviewing the medical record, coordinating care amongst consultants and care team members, directly assessing and discussing care with the patient and/or family: 55 min  Laree Lock, MD [facility]  To  contact the attending physician between 7A-7P please use Epic Chat. To contact the covering physician during after hours 7P-7A, please review Amion.  07/24/2024, 9:24 AM   *This document has been created with the assistance of dictation software. Please excuse typographical errors. *

## 2024-07-25 LAB — CBC
HCT: 34.4 % — ABNORMAL LOW (ref 36.0–46.0)
Hemoglobin: 10.9 g/dL — ABNORMAL LOW (ref 12.0–15.0)
MCH: 33 pg (ref 26.0–34.0)
MCHC: 31.7 g/dL (ref 30.0–36.0)
MCV: 104.2 fL — ABNORMAL HIGH (ref 80.0–100.0)
Platelets: 109 K/uL — ABNORMAL LOW (ref 150–400)
RBC: 3.3 MIL/uL — ABNORMAL LOW (ref 3.87–5.11)
RDW: 14 % (ref 11.5–15.5)
WBC: 32.5 K/uL — ABNORMAL HIGH (ref 4.0–10.5)
nRBC: 0 % (ref 0.0–0.2)

## 2024-07-25 LAB — BASIC METABOLIC PANEL WITH GFR
Anion gap: 6 (ref 5–15)
BUN: 16 mg/dL (ref 8–23)
CO2: 26 mmol/L (ref 22–32)
Calcium: 8.3 mg/dL — ABNORMAL LOW (ref 8.9–10.3)
Chloride: 107 mmol/L (ref 98–111)
Creatinine, Ser: 0.68 mg/dL (ref 0.44–1.00)
GFR, Estimated: >60 mL/min (ref >60)
Glucose, Bld: 133 mg/dL — ABNORMAL HIGH (ref 70–99)
Potassium: 3.8 mmol/L (ref 3.5–5.1)
Sodium: 139 mmol/L (ref 135–145)

## 2024-07-25 LAB — GLUCOSE, CAPILLARY
Glucose-Capillary: 117 mg/dL — ABNORMAL HIGH (ref 70–99)
Glucose-Capillary: 164 mg/dL — ABNORMAL HIGH (ref 70–99)
Glucose-Capillary: 190 mg/dL — ABNORMAL HIGH (ref 70–99)
Glucose-Capillary: 139 mg/dL — ABNORMAL HIGH (ref 70–99)
Glucose-Capillary: 155 mg/dL — ABNORMAL HIGH (ref 70–99)

## 2024-07-25 LAB — FOLATE: Folate: 14.7 ng/mL (ref >5.9)

## 2024-07-25 LAB — MAGNESIUM: Magnesium: 2 mg/dL (ref 1.7–2.4)

## 2024-07-25 LAB — POTASSIUM: Potassium: 3.7 mmol/L (ref 3.5–5.1)

## 2024-07-25 MED ORDER — ACETAMINOPHEN 325 MG PO TABS
650.0000 mg | ORAL_TABLET | Freq: Four times a day (QID) | ORAL | Status: DC | PRN
  Administered 2024-07-25 – 2024-07-27 (×4): 650 mg via ORAL
  Filled 2024-07-25 (×4): qty 2

## 2024-07-25 MED ORDER — POTASSIUM CHLORIDE CRYS ER 20 MEQ PO TBCR
40.0000 meq | EXTENDED_RELEASE_TABLET | Freq: Once | ORAL | Status: AC
  Administered 2024-07-26: 40 meq via ORAL
  Filled 2024-07-25: qty 2

## 2024-07-25 MED ORDER — PREDNISONE 10 MG PO TABS
50.0000 mg | ORAL_TABLET | Freq: Four times a day (QID) | ORAL | Status: AC
  Administered 2024-07-25 – 2024-07-26 (×3): 50 mg via ORAL
  Filled 2024-07-25 (×3): qty 5

## 2024-07-25 MED ORDER — DIPHENHYDRAMINE HCL 25 MG PO CAPS
50.0000 mg | ORAL_CAPSULE | Freq: Once | ORAL | Status: AC
  Administered 2024-07-26: 50 mg via ORAL
  Filled 2024-07-25: qty 1
  Filled 2024-07-25: qty 2

## 2024-07-25 NOTE — Plan of Care (Signed)
  Problem: Education: Goal: Ability to describe self-care measures that may prevent or decrease complications (Diabetes Survival Skills Education) will improve Outcome: Progressing   Problem: Coping: Goal: Ability to adjust to condition or change in health will improve Outcome: Progressing   Problem: Fluid Volume: Goal: Ability to maintain a balanced intake and output will improve Outcome: Progressing   Problem: Health Behavior/Discharge Planning: Goal: Ability to identify and utilize available resources and services will improve Outcome: Progressing   Problem: Metabolic: Goal: Ability to maintain appropriate glucose levels will improve Outcome: Progressing   Problem: Nutritional: Goal: Maintenance of adequate nutrition will improve Outcome: Progressing   Problem: Skin Integrity: Goal: Risk for impaired skin integrity will decrease Outcome: Progressing   Problem: Tissue Perfusion: Goal: Adequacy of tissue perfusion will improve Outcome: Progressing   Problem: Health Behavior/Discharge Planning: Goal: Ability to manage health-related needs will improve Outcome: Progressing   Problem: Clinical Measurements: Goal: Ability to maintain clinical measurements within normal limits will improve Outcome: Progressing

## 2024-07-25 NOTE — Plan of Care (Signed)
  Problem: Coping: Goal: Ability to adjust to condition or change in health will improve Outcome: Progressing   Problem: Nutritional: Goal: Maintenance of adequate nutrition will improve Outcome: Progressing   Problem: Tissue Perfusion: Goal: Adequacy of tissue perfusion will improve Outcome: Progressing   Problem: Clinical Measurements: Goal: Ability to maintain clinical measurements within normal limits will improve Outcome: Progressing Goal: Diagnostic test results will improve Outcome: Progressing

## 2024-07-25 NOTE — Progress Notes (Signed)
 Physical Therapy Treatment Patient Details Name: Melissa Curtis MRN: [id number] DOB: Jun 25, 1941 Today's Date: 07/25/2024   History of Present Illness Pt is an 83 y.o. female presenting to hospital 07/23/24 with c/o dizziness, nausea, and vomiting.  Per chart pt has been dealing with L hip bursitis past 2 weeks.  Pt admitted with sepsis, acute hypoxic respiratory failure, syncope (likely vasovagal), leukocytosis, N/V.  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    PT Comments  Pt resting in bed upon PT arrival; pt's granddaughter and significant other left beginning of session.  Pt reports 2-3/10 L hip pain beginning of session and pain increased to 3-4/10 with activity (nurse notified; pt with recent pain medication).  During session pt was SBA semi-supine to sitting EOB; CGA with transfers using RW; and CGA to take steps in place (10 reps x3 sets B LE's with RW use).  L knee/LE giving noted when taking steps during session but no L LE buckling noted (pt reports h/o L LE weakness and giving).  SpO2 sats 93% or greater on 5 L O2 via nasal cannula with HR in 70's to 80's bpm during session.  Nurse updated on pt's status.    If plan is discharge home, recommend the following: A little help with walking and/or transfers;A little help with bathing/dressing/bathroom;Assistance with cooking/housework;Assist for transportation;Help with stairs or ramp for entrance   Can travel by private vehicle      Yes  Equipment Recommendations  Rolling walker (2 wheels) (pt has RW at home already)    Recommendations for Other Services       Precautions / Restrictions Precautions Precautions: Fall Recall of Precautions/Restrictions: Intact Restrictions Weight Bearing Restrictions Per Provider Order: No     Mobility  Bed Mobility Overal bed mobility: Needs  Assistance Bed Mobility: Supine to Sit     Supine to sit: Supervision, HOB elevated     General bed mobility comments: mild increased effort to perform on own; SBA for lines    Transfers Overall transfer level: Needs assistance Equipment used: Rolling walker (2 wheels) Transfers: Sit to/from Stand, Bed to chair/wheelchair/BSC Sit to Stand: Contact guard assist   Step pivot transfers: Contact guard assist (Stand step turn bed to recliner with RW use; decreased stance time and mild L knee giving during transfer but no buckling noted)       General transfer comment: x1 trial from bed; x5 trials from recliner; initial vc's for UE placement    Ambulation/Gait Ambulation/Gait assistance: Contact guard assist Gait Distance (Feet):  (x10 reps B LE's (x3 trials) taking steps in place) Assistive device: Rolling walker (2 wheels)   Gait velocity: decreased     General Gait Details: mild L knee giving intermittently but no buckling noted; decreased stance time L LE   Stairs             Wheelchair Mobility     Tilt Bed    Modified Rankin (Stroke Patients Only)       Balance Overall balance assessment: Needs assistance Sitting-balance support: No upper extremity supported, Feet supported Sitting balance-Leahy Scale: Good Sitting balance - Comments: steady in sitting reaching within BOS   Standing balance support: Bilateral upper extremity supported, Reliant on assistive device for balance Standing balance-Leahy Scale: Fair Standing balance comment: steady static standing with B UE support on RW  Communication Communication Communication: No apparent difficulties  Cognition Arousal: Alert Behavior During Therapy: WFL for tasks assessed/performed   PT - Cognitive impairments: No apparent impairments                         Following commands: Intact      Cueing Cueing Techniques: Verbal cues  Exercises       General Comments  Nursing cleared pt for participation in physical therapy.  Pt agreeable to PT session.      Pertinent Vitals/Pain Pain Assessment Pain Assessment: 0-10 Pain Score: 4  Pain Location: L hip Pain Descriptors / Indicators: Aching Pain Intervention(s): Limited activity within patient's tolerance, Monitored during session, Premedicated before session, Repositioned    Home Living                          Prior Function            PT Goals (current goals can now be found in the care plan section) Acute Rehab PT Goals Patient Stated Goal: to improve strength and walking PT Goal Formulation: With patient Time For Goal Achievement: 08/07/24 Potential to Achieve Goals: Good Progress towards PT goals: Progressing toward goals    Frequency    Min 3X/week      PT Plan      Co-evaluation              AM-PAC PT 6 Clicks Mobility   Outcome Measure  Help needed turning from your back to your side while in a flat bed without using bedrails?: None Help needed moving from lying on your back to sitting on the side of a flat bed without using bedrails?: A Little Help needed moving to and from a bed to a chair (including a wheelchair)?: A Little Help needed standing up from a chair using your arms (e.g., wheelchair or bedside chair)?: A Little Help needed to walk in hospital room?: A Little Help needed climbing 3-5 steps with a railing? : A Lot 6 Click Score: 18    End of Session Equipment Utilized During Treatment: Gait belt Activity Tolerance: Patient tolerated treatment well Patient left: in chair;with call bell/phone within reach;with chair alarm set Nurse Communication: Mobility status;Precautions;Other (comment) (Pt's pain status and SpO2 sats during session) PT Visit Diagnosis: Other abnormalities of gait and mobility (R26.89);Muscle weakness (generalized) (M62.81);Pain Pain - Right/Left: Left Pain - part of body: Hip     Time:  1212-1239 PT Time Calculation (min) (ACUTE ONLY): 27 min  Charges:    $Therapeutic Activity: 23-37 mins PT General Charges $$ ACUTE PT VISIT: 1 Visit                     Damien Caulk, PT 07/25/24, 1:40 PM

## 2024-07-25 NOTE — Evaluation (Signed)
 Clinical/Bedside Swallow Evaluation Patient Details  Name: STEPHENIE Curtis MRN: [id number] Date of Birth: 1941/03/21  Today's Date: 07/25/2024 Time: SLP Start Time (ACUTE ONLY): 1325 SLP Stop Time (ACUTE ONLY): 1340 SLP Time Calculation (min) (ACUTE ONLY): 15 min  Past Medical History:  Past Medical History:  Diagnosis Date   Breast cancer (HCC) 1983   had double mastectomy   Cataracts, bilateral    CLL (chronic lymphocytic leukemia) (HCC)    Diabetes (HCC)    GERD (gastroesophageal reflux disease)    Hypertension    Leukocytosis 01/2014   Osteopenia    Past Surgical History:  Past Surgical History:  Procedure Laterality Date   APPENDECTOMY     AUGMENTATION MAMMAPLASTY Bilateral 1983   COLONOSCOPY     by Dr. Viktoria over 5 years ago   double mastectomy with breast rescontruction  1983   ESOPHAGOGASTRODUODENOSCOPY N/A 06/06/2021   Procedure: ESOPHAGOGASTRODUODENOSCOPY (EGD);  Surgeon: Dessa Reyes ORN, MD;  Location: [facility] ENDOSCOPY;  Service: Endoscopy;  Laterality: N/A;   ESOPHAGOGASTRODUODENOSCOPY N/A 04/19/2024   Procedure: EGD (ESOPHAGOGASTRODUODENOSCOPY);  Surgeon: Maryruth Ole DASEN, MD;  Location: [facility] ENDOSCOPY;  Service: Endoscopy;  Laterality: N/A;  DM   MASTECTOMY Bilateral 1983   right breast ca mastecomy only no chemo no rad bilat implants    HPI:  Per H&P progress note, ALIZAH SILLS is a 83 y.o. 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  Patient admitted for sepsis due to aspiration pneumonia and acute hypoxic respiratory failure. CT Head No acute CT finding. Age related atrophy. Advanced chronic  small-vessel ischemic changes of the pons and cerebral hemispheric  white matter. CXR No active disease.    Assessment / Plan / Recommendation  Clinical  Impression  Pt seen for bedside swallow assessment in the setting of concern for aspiration PNA. Pt with known esophageal concerns. EGD completed 04/19/24, with results including, Low-grade of narrowing Schatzki ring (Dilated) and 2 cm hiatal hernia. Pt reports increased discomfort when consuming spicy foods- but reported reduced globus sensation since EGD completed. RN denied overt issue with PO intake and medication pass.   Pt seen with trials of thin liquids (via straw) and regular solids. No overt or subtle s/sx pharyngeal dysphagia noted. No change to vocal quality across trials. Vitals stable for duration of trials (O2 remained 97 and greater for duration of session). Oral phase grossly intact- with complete manipulation and clearance of regular solid from oral cavity.   Based on age, known esophageal concerns/complications, and current debility, pt is at increased risk of aspiration, therefore recommend aspiration precautions (slow rate, small bites, elevated HOB, and alert for PO intake). Education shared regarding esophageal precautions as follows  Esophageal precautions: slow rate of intake, smaller more frequent meals, follow bites of food with liquids, remain upright for 30-60 minutes after eating, do not eat/drink 2-3 hours before bedtime except water, elevate head of bed at least 30 degrees   Pt reported understanding across educations. Continue with current unrestricted diet. MD and RN aware of recommendations. No further acute SLP services indicated.   SLP Visit Diagnosis: Dysphagia, unspecified (R13.10) (suspect esophageal concerns)    Aspiration Risk  Mild aspiration risk    Diet Recommendation   Thin;Age appropriate regular  Medication Administration: Whole meds with liquid    Other  Recommendations Recommended Consults: Consider GI evaluation (continue with  OP GI follow up as indicated) Oral Care Recommendations: Oral care BID;Patient independent with oral care      Assistance Recommended at Discharge  Intermittent supervision for application of precautions  Functional Status Assessment Patient has not had a recent decline in their functional status (suspect at oropharyngeal baseline)    Swallow Study   General Date of Onset: 07/25/24 HPI: Per H&P progress note, Melissa Curtis is a 83 y.o. 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  Patient admitted for sepsis due to aspiration pneumonia and acute hypoxic respiratory failure. CT Head No acute CT finding. Age related atrophy. Advanced chronic  small-vessel ischemic changes of the pons and cerebral hemispheric  white matter. CXR No active disease. Type of Study: Bedside Swallow Evaluation Previous Swallow Assessment: none in chart Diet Prior to this Study: Regular;Thin liquids (Level 0) Temperature Spikes Noted: No (WBC 32.5) Respiratory Status: Nasal cannula (4L) History of Recent Intubation: No Behavior/Cognition: Alert;Cooperative;Pleasant mood Oral Cavity Assessment: Within Functional Limits Oral Care Completed by SLP: Recent completion by staff Oral Cavity - Dentition: Adequate natural dentition Vision: Functional for self-feeding Self-Feeding Abilities: Able to feed self Patient Positioning: Upright in bed Baseline Vocal Quality: Normal Volitional Cough: Strong Volitional Swallow: Able to elicit    Oral/Motor/Sensory Function Overall Oral Motor/Sensory Function: Within functional limits   Ice Chips Ice chips: Not tested   Thin Liquid Thin Liquid: Within functional limits Presentation: Straw;Self Fed    Nectar Thick Nectar Thick Liquid: Not tested   Honey Thick Honey Thick Liquid: Not tested   Puree Puree: Not tested   Solid     Solid: Within functional limits Presentation: Self  Fed     [place] Khalie Wince Clapp, MS, CCC-SLP Speech Language Pathologist Rehab Services; [facility] [place] [phone] (ascom)   [place] J Clapp 07/25/2024,2:45 PM

## 2024-07-25 NOTE — Progress Notes (Addendum)
 PROGRESS NOTE    Melissa Curtis  FMW:[id number] DOB: 06-23-1941 DOA: 07/23/2024 PCP: Zachary Idelia LABOR, MD  Chief Complaint  Patient presents with   Dizziness    Hospital Course:  Melissa Curtis is a 83 y.o. 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 Patient admitted for sepsis due to aspiration pneumonia and acute hypoxic respiratory failure.  Hospital course as below  Subjective: Patient was examined at the bedside, granddaughter present. Patient reports feeling much better today, denies any complaints except Left hip pain Discussed updates at the bedside and answered all questions   Objective: Vitals:   07/25/24 1500 07/25/24 1515 07/25/24 1530 07/25/24 1545  BP: (!) 147/66     Pulse: (!) 40 73 (!) 30 (!) 56  Resp: 20 10 15 17   Temp:      TempSrc:      SpO2: 93% 99% 100% (!) 83%  Weight:      Height:        Intake/Output Summary (Last 24 hours) at 07/25/2024 1646 Last data filed at 07/25/2024 1500 Gross per 24 hour  Intake 530.35 ml  Output 1475 ml  Net -944.65 ml   Filed Weights   07/23/24 1056 07/23/24 1812  Weight: 62.6 kg 62.5 kg    Examination: Constitutional: NAD, calm, comfortable  Neck: normal, supple, no masses, no thyromegaly Respiratory: clear to auscultation bilaterally, no wheezing, b/l crackles present, No inc WOB. No accessory muscle use.  Cardiovascular: Regular rate and rhythm, no murmurs / rubs / gallops. No extremity edema. 2+ pedal pulses Abdomen: no tenderness, no masses palpated. No hepatosplenomegaly. Bowel sounds positive.  Musculoskeletal: no clubbing / cyanosis. No joint deformity upper and lower extremities. Good ROM, no contractures. Normal muscle tone.  Skin: no rashes, lesions, ulcers. No induration Neurologic: CN 2-12 grossly intact.  Sensation intact, DTR normal. Strength 5/5 in all 4.   Assessment & Plan:  Principal Problem:   Aspiration pneumonia (HCC) Active Problems:   Sepsis (HCC)  Aspiration pneumonia Sepsis without acute endorgan damage - Presented with hypoxia, tachycardia, leukocytosis, lactic acidosis, and source of infection is bilateral pneumonia likely aspiration pneumonia. - Lactic acidosis resolved - Leukocytosis 46.3 -> 48.0 -> 32.5 - RVP negative, Chest x-ray showed bilateral lower fields infiltrates likely aspiration pneumonia - d-dimer 0.95 (age adjusted d-dimer < 0.84) - symptoms improving (tachycardia resolved, hypotension and hypoxia improving) - CTA pendning - will premedicate with prednisone  and Benadryl  - Continue Unasyn  - Follow blood cultures, Rcx - SLP eval - esophageal precautions, no changes in diet   Acute hypoxic respiratory failure - Consult RT, was on high flow oxygen 6l -> 4l  - Antibiotics as above - DuoNebs prn, Incentive spirometry   Syncope, likely vasovagal - Secondary to nauseous vomiting - Resolved. - CT head no acute findings.  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  No evidence of mass effect or brain edema.   Leukocytosis - improving History of CLL - Likely multifactorial from sepsis and possible leukemoid reaction from recent steroid use. - Monitor wbc  Macrocytic anemia - Monitor Hb - Folate normal - B12, pending   Nauseous vomiting - resolved - Resolved, abdominal exam benign.  Abd Xray neg - As needed Zofran  - Recent EGD results reviewed   IIDM with hyperglycemia - HbA1c 6.9 SSI   History of dysphagia secondary to  Schatzki's ring - Status post EGD and dilation recently - Stable, no complaints, unlikely to cause her GI symptoms today  CAD - On asa, statin  HTN - Diltiazem , Losartan  held due to hypotension  Left hip bursitis - Tylenol  prn  -- PT eval rec Home PT  DVT prophylaxis: Lovenox  SQ   Code Status: Full  Code Disposition:  Home with Home PT  Consultants:  None  Procedures:  None  Antimicrobials:  Anti-infectives (From admission, onward)    Start     Dose/Rate Route Frequency Ordered Stop   07/23/24 2200  Ampicillin -Sulbactam (UNASYN ) 3 g in sodium chloride  0.9 % 100 mL IVPB        3 g 200 mL/hr over 30 Minutes Intravenous Every 6 hours 07/23/24 1545     07/23/24 1445  Ampicillin -Sulbactam (UNASYN ) 3 g in sodium chloride  0.9 % 100 mL IVPB        3 g 200 mL/hr over 30 Minutes Intravenous  Once 07/23/24 1434 07/23/24 1558       Data Reviewed: I have personally reviewed following labs and imaging studies CBC: Recent Labs  Lab 07/23/24 1056 07/23/24 1515 07/24/24 0426 07/25/24 0439  WBC 46.3* 41.3* 48.0* 32.5*  NEUTROABS  --  4.8  --   --   HGB 13.0 13.4 11.3* 10.9*  HCT 40.5 41.5 33.8* 34.4*  MCV 101.0* 100.5* 99.1 104.2*  PLT 156 150 133* 109*   Basic Metabolic Panel: Recent Labs  Lab 07/23/24 1056 07/25/24 0439  NA 137 139  K 4.2 3.8  CL 101 107  CO2 25 26  GLUCOSE 188* 133*  BUN 21 16  CREATININE 0.89 0.68  CALCIUM 8.7* 8.3*   GFR: Estimated Creatinine Clearance: 45.5 mL/min (by C-G formula based on SCr of 0.68 mg/dL). Liver Function Tests: Recent Labs  Lab 07/23/24 1056  AST 15  ALT 11  ALKPHOS 53  BILITOT 0.7  PROT 5.5*  ALBUMIN 3.1*   CBG: Recent Labs  Lab 07/24/24 1159 07/24/24 1602 07/25/24 0736 07/25/24 1133 07/25/24 1507  GLUCAP 253* 99 117* 164* 190*    Recent Results (from the past 240 hours)  Resp panel by RT-PCR (RSV, Flu A&B, Covid) Anterior Nasal Swab     Status: None   Collection Time: 07/23/24  1:48 PM   Specimen: Anterior Nasal Swab  Result Value Ref Range Status   SARS Coronavirus 2 by RT PCR NEGATIVE NEGATIVE Final    Comment: (NOTE) SARS-CoV-2 target nucleic acids are NOT DETECTED.  The SARS-CoV-2 RNA is generally detectable in upper respiratory specimens during the acute phase of infection. The  lowest concentration of SARS-CoV-2 viral copies this assay can detect is 138 copies/mL. A negative result does not preclude SARS-Cov-2 infection and should not be used as the sole basis for treatment or other patient management decisions. A negative result may occur with  improper specimen collection/handling, submission of specimen other than nasopharyngeal swab, presence of viral mutation(s) within the areas targeted by this assay, and inadequate number of viral copies(<138 copies/mL). A negative result must be combined with clinical observations, patient history, and epidemiological information. The expected result is Negative.  Fact Sheet for Patients:  BloggerCourse.com  Fact Sheet for Healthcare Providers:  SeriousBroker.it  This test is no t yet approved or cleared by the [place]  FDA and  has been authorized for detection and/or diagnosis of SARS-CoV-2 by FDA under an Emergency Use Authorization (EUA). This EUA will remain  in effect (meaning this test can be used)  for the duration of the COVID-19 declaration under Section 564(b)(1) of the Act, 21 U.S.C.section 360bbb-3(b)(1), unless the authorization is terminated  or revoked sooner.       Influenza A by PCR NEGATIVE NEGATIVE Final   Influenza B by PCR NEGATIVE NEGATIVE Final    Comment: (NOTE) The Xpert Xpress SARS-CoV-2/FLU/RSV plus assay is intended as an aid in the diagnosis of influenza from Nasopharyngeal swab specimens and should not be used as a sole basis for treatment. Nasal washings and aspirates are unacceptable for Xpert Xpress SARS-CoV-2/FLU/RSV testing.  Fact Sheet for Patients: BloggerCourse.com  Fact Sheet for Healthcare Providers: SeriousBroker.it  This test is not yet approved or cleared by the [place]  FDA and has been authorized for detection and/or diagnosis of SARS-CoV-2 by FDA under  an Emergency Use Authorization (EUA). This EUA will remain in effect (meaning this test can be used) for the duration of the COVID-19 declaration under Section 564(b)(1) of the Act, 21 U.S.C. section 360bbb-3(b)(1), unless the authorization is terminated or revoked.     Resp Syncytial Virus by PCR NEGATIVE NEGATIVE Final    Comment: (NOTE) Fact Sheet for Patients: BloggerCourse.com  Fact Sheet for Healthcare Providers: SeriousBroker.it  This test is not yet approved or cleared by the [place]  FDA and has been authorized for detection and/or diagnosis of SARS-CoV-2 by FDA under an Emergency Use Authorization (EUA). This EUA will remain in effect (meaning this test can be used) for the duration of the COVID-19 declaration under Section 564(b)(1) of the Act, 21 U.S.C. section 360bbb-3(b)(1), unless the authorization is terminated or revoked.  Performed at [facility], [address] [place], [place] [address]   Culture, blood (Routine X 2) w Reflex to ID Panel     Status: None (Preliminary result)   Collection Time: 07/23/24  5:46 PM   Specimen: BLOOD  Result Value Ref Range Status   Specimen Description BLOOD BLOOD RIGHT HAND  Final   Special Requests   Final    BOTTLES DRAWN AEROBIC AND ANAEROBIC Blood Culture results may not be optimal due to an inadequate volume of blood received in culture bottles   Culture   Final    NO GROWTH 2 DAYS Performed at [facility], [address], [place], [place] [address]    Report Status PENDING  Incomplete  Culture, blood (Routine X 2) w Reflex to ID Panel     Status: None (Preliminary result)   Collection Time: 07/23/24  5:50 PM   Specimen: BLOOD  Result Value Ref Range Status   Specimen Description BLOOD LEFT ANTECUBITAL  Final   Special Requests   Final    BOTTLES DRAWN AEROBIC AND ANAEROBIC Blood Culture results may not be optimal due to an inadequate volume  of blood received in culture bottles   Culture   Final    NO GROWTH 2 DAYS Performed at [facility], [address] [place], [place] [address]    Report Status PENDING  Incomplete  MRSA Next Gen by PCR, Nasal     Status: None   Collection Time: 07/23/24  6:14 PM   Specimen: Nasal Mucosa; Nasal Swab  Result Value Ref Range Status   MRSA by PCR Next Gen NOT DETECTED NOT DETECTED Final    Comment: (NOTE) The GeneXpert MRSA Assay (FDA approved for NASAL specimens only), is one component of a comprehensive MRSA colonization surveillance program. It is not intended to diagnose MRSA infection nor to guide or monitor treatment for MRSA  infections. Test performance is not FDA approved in patients less than 67 years old. Performed at [facility], [address] [place], [place] [address]   Respiratory (~20 pathogens) panel by PCR     Status: None   Collection Time: 07/24/24  1:00 AM   Specimen: Nasopharyngeal Swab; Respiratory  Result Value Ref Range Status   Adenovirus NOT DETECTED NOT DETECTED Final   Coronavirus 229E NOT DETECTED NOT DETECTED Final    Comment: (NOTE) The Coronavirus on the Respiratory Panel, DOES NOT test for the novel  Coronavirus (2019 nCoV)    Coronavirus HKU1 NOT DETECTED NOT DETECTED Final   Coronavirus NL63 NOT DETECTED NOT DETECTED Final   Coronavirus OC43 NOT DETECTED NOT DETECTED Final   Metapneumovirus NOT DETECTED NOT DETECTED Final   Rhinovirus / Enterovirus NOT DETECTED NOT DETECTED Final   Influenza A NOT DETECTED NOT DETECTED Final   Influenza B NOT DETECTED NOT DETECTED Final   Parainfluenza Virus 1 NOT DETECTED NOT DETECTED Final   Parainfluenza Virus 2 NOT DETECTED NOT DETECTED Final   Parainfluenza Virus 3 NOT DETECTED NOT DETECTED Final   Parainfluenza Virus 4 NOT DETECTED NOT DETECTED Final   Respiratory Syncytial Virus NOT DETECTED NOT DETECTED Final   Bordetella pertussis NOT DETECTED NOT DETECTED Final   Bordetella  Parapertussis NOT DETECTED NOT DETECTED Final   Chlamydophila pneumoniae NOT DETECTED NOT DETECTED Final   Mycoplasma pneumoniae NOT DETECTED NOT DETECTED Final    Comment: Performed at [facility], [address] [place], [place] [address]     Radiology Studies: No results found.   Scheduled Meds:  aspirin  EC  81 mg Oral Daily   Chlorhexidine  Gluconate Cloth  6 each Topical Q0600   enoxaparin  (LOVENOX ) injection  40 mg Subcutaneous Q24H   guaiFENesin   1,200 mg Oral BID   insulin  aspart  0-9 Units Subcutaneous TID WC   ipratropium-albuterol   3 mL Nebulization BID   loratadine   10 mg Oral Daily   montelukast   10 mg Oral Daily   pantoprazole   40 mg Oral Daily   pravastatin   10 mg Oral QHS   traZODone   100 mg Oral QHS   venlafaxine  XR  75 mg Oral Daily   Continuous Infusions:  ampicillin -sulbactam (UNASYN ) IV Stopped (07/25/24 1409)     LOS: 2 days  MDM: Patient is high risk for one or more organ failure.  They necessitate ongoing hospitalization for continued IV therapies and subsequent lab monitoring. Total time spent interpreting labs and vitals, reviewing the medical record, coordinating care amongst consultants and care team members, directly assessing and discussing care with the patient and/or family: 55 min  Laree Lock, MD [facility]  To contact the attending physician between 7A-7P please use Epic Chat. To contact the covering physician during after hours 7P-7A, please review Amion.  07/25/2024, 4:46 PM   *This document has been created with the assistance of dictation software. Please excuse typographical errors. *

## 2024-07-26 ENCOUNTER — Inpatient Hospital Stay

## 2024-07-26 ENCOUNTER — Inpatient Hospital Stay: Admit: 2024-07-26 | Discharge: 2024-07-26 | Disposition: A | Discharge status: Home / Self Care

## 2024-07-26 LAB — BASIC METABOLIC PANEL WITH GFR
Anion gap: 5 (ref 5–15)
BUN: 12 mg/dL (ref 8–23)
CO2: 26 mmol/L (ref 22–32)
Calcium: 8.6 mg/dL — ABNORMAL LOW (ref 8.9–10.3)
Chloride: 105 mmol/L (ref 98–111)
Creatinine, Ser: 0.52 mg/dL (ref 0.44–1.00)
GFR, Estimated: >60 mL/min (ref >60)
Glucose, Bld: 219 mg/dL — ABNORMAL HIGH (ref 70–99)
Potassium: 5.2 mmol/L — ABNORMAL HIGH (ref 3.5–5.1)
Sodium: 136 mmol/L (ref 135–145)

## 2024-07-26 LAB — ECHOCARDIOGRAM COMPLETE
AR max vel: 3.78 cm2
AV Area VTI: 3.44 cm2
AV Area mean vel: 3.53 cm2
AV Mean grad: 5 mmHg
AV Peak grad: 8.5 mmHg
Ao pk vel: 1.46 m/s
Area-P 1/2: 3.37 cm2
Calc EF: 57.5 %
Height: 62 in
MV VTI: 3.36 cm2
S' Lateral: 2 cm
Single Plane A2C EF: 60 %
Single Plane A4C EF: 66.4 %
Weight: 2204.6 [oz_av]

## 2024-07-26 LAB — POTASSIUM: Potassium: 3.4 mmol/L — ABNORMAL LOW (ref 3.5–5.1)

## 2024-07-26 LAB — GLUCOSE, CAPILLARY
Glucose-Capillary: 122 mg/dL — ABNORMAL HIGH (ref 70–99)
Glucose-Capillary: 215 mg/dL — ABNORMAL HIGH (ref 70–99)
Glucose-Capillary: 292 mg/dL — ABNORMAL HIGH (ref 70–99)
Glucose-Capillary: 249 mg/dL — ABNORMAL HIGH (ref 70–99)
Glucose-Capillary: 223 mg/dL — ABNORMAL HIGH (ref 70–99)

## 2024-07-26 LAB — CBC
HCT: 33.9 % — ABNORMAL LOW (ref 36.0–46.0)
Hemoglobin: 11.1 g/dL — ABNORMAL LOW (ref 12.0–15.0)
MCH: 32.9 pg (ref 26.0–34.0)
MCHC: 32.7 g/dL (ref 30.0–36.0)
MCV: 100.6 fL — ABNORMAL HIGH (ref 80.0–100.0)
Platelets: 112 K/uL — ABNORMAL LOW (ref 150–400)
RBC: 3.37 MIL/uL — ABNORMAL LOW (ref 3.87–5.11)
RDW: 13.5 % (ref 11.5–15.5)
WBC: 28 K/uL — ABNORMAL HIGH (ref 4.0–10.5)
nRBC: 0 % (ref 0.0–0.2)

## 2024-07-26 LAB — BRAIN NATRIURETIC PEPTIDE: B Natriuretic Peptide: 300.4 pg/mL — ABNORMAL HIGH (ref 0.0–100.0)

## 2024-07-26 LAB — MAGNESIUM: Magnesium: 2 mg/dL (ref 1.7–2.4)

## 2024-07-26 MED ORDER — FUROSEMIDE 10 MG/ML IJ SOLN
20.0000 mg | Freq: Two times a day (BID) | INTRAMUSCULAR | Status: DC
  Administered 2024-07-26: 20 mg via INTRAVENOUS
  Filled 2024-07-26: qty 2

## 2024-07-26 MED ORDER — LOSARTAN POTASSIUM 25 MG PO TABS
25.0000 mg | ORAL_TABLET | Freq: Every day | ORAL | Status: DC
  Administered 2024-07-26 – 2024-07-27 (×2): 25 mg via ORAL
  Filled 2024-07-26 (×2): qty 1

## 2024-07-26 MED ORDER — PRAVASTATIN SODIUM 20 MG PO TABS
20.0000 mg | ORAL_TABLET | Freq: Every day | ORAL | Status: DC
Start: 2024-07-26 — End: 2024-07-27
  Administered 2024-07-26: 20 mg via ORAL
  Filled 2024-07-26: qty 1

## 2024-07-26 MED ORDER — POTASSIUM CHLORIDE CRYS ER 20 MEQ PO TBCR
40.0000 meq | EXTENDED_RELEASE_TABLET | Freq: Once | ORAL | Status: AC
  Administered 2024-07-26: 40 meq via ORAL
  Filled 2024-07-26: qty 2

## 2024-07-26 MED ORDER — FUROSEMIDE 10 MG/ML IJ SOLN
40.0000 mg | Freq: Two times a day (BID) | INTRAMUSCULAR | Status: DC
  Administered 2024-07-26 – 2024-07-27 (×2): 40 mg via INTRAVENOUS
  Filled 2024-07-26 (×2): qty 4

## 2024-07-26 MED ORDER — SODIUM ZIRCONIUM CYCLOSILICATE 5 G PO PACK: 10.0000 g | PACK | Freq: Two times a day (BID) | ORAL | Status: DC

## 2024-07-26 MED ORDER — PERFLUTREN LIPID MICROSPHERE
1.0000 mL | INTRAVENOUS | Status: AC | PRN
  Administered 2024-07-26: 2 mL via INTRAVENOUS

## 2024-07-26 MED ORDER — IOHEXOL 350 MG/ML SOLN
75.0000 mL | Freq: Once | INTRAVENOUS | Status: AC | PRN
  Administered 2024-07-26: 75 mL via INTRAVENOUS

## 2024-07-26 NOTE — Care Management Important Message (Signed)
 Important Message  Patient Details  Name: Melissa Curtis MRN: [id number] Date of Birth: 1941-01-11   Important Message Given:  Yes - Medicare IM     Rojelio SHAUNNA Rattler 07/26/2024, 12:28 PM

## 2024-07-26 NOTE — Progress Notes (Signed)
 Physical Therapy Treatment Patient Details Name: Melissa Curtis MRN: [id number] DOB: 03/11/1941 Today's Date: 07/26/2024   History of Present Illness Pt is an 83 y.o. female presenting to hospital 07/23/24 with c/o dizziness, nausea, and vomiting.  Per chart pt has been dealing with L hip bursitis past 2 weeks.  Pt admitted with sepsis, acute hypoxic respiratory failure, syncope (likely vasovagal), leukocytosis, N/V.  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    PT Comments  Pt resting in bed upon PT arrival; pt agreeable to therapy session.  No c/o pain (including L hip pain) during session.  Pt was SBA semi-supine to sitting EOB; CGA to stand from bed; and CGA to ambulate in hallway with RW use (chair follow for safety).  SpO2 sats 93% or greater on 4 L O2 via nasal cannula and HR 88-99 bpm during sessions activities.  Will continue to focus on strengthening, endurance, and progressive functional mobility (including stairs trial) during hospitalization.    If plan is discharge home, recommend the following: A little help with walking and/or transfers;A little help with bathing/dressing/bathroom;Assistance with cooking/housework;Assist for transportation;Help with stairs or ramp for entrance   Can travel by private vehicle      Yes  Equipment Recommendations  Rolling walker (2 wheels) (pt has RW at home already)    Recommendations for Other Services       Precautions / Restrictions Precautions Precautions: Fall Recall of Precautions/Restrictions: Intact Restrictions Weight Bearing Restrictions Per Provider Order: No     Mobility  Bed Mobility Overal bed mobility: Needs Assistance Bed Mobility: Supine to Sit     Supine to sit: Supervision, HOB elevated     General bed mobility comments: mild increased effort to perform on own; SBA for  lines    Transfers Overall transfer level: Needs assistance Equipment used: Rolling walker (2 wheels) Transfers: Sit to/from Stand Sit to Stand: Contact guard assist           General transfer comment: fairly strong stand from bed; vc's for UE placement    Ambulation/Gait Ambulation/Gait assistance: Contact guard assist Gait Distance (Feet): 140 Feet Assistive device: Rolling walker (2 wheels)   Gait velocity: decreased     General Gait Details: mild decreased stance time L LE; steady ambulating with RW use   Stairs             Wheelchair Mobility     Tilt Bed    Modified Rankin (Stroke Patients Only)       Balance Overall balance assessment: Needs assistance Sitting-balance support: No upper extremity supported, Feet supported Sitting balance-Leahy Scale: Good Sitting balance - Comments: steady in sitting reaching within BOS   Standing balance support: Bilateral upper extremity supported, Reliant on assistive device for balance Standing balance-Leahy Scale: Good Standing balance comment: steady ambulating with RW use                            Communication Communication Communication: No apparent difficulties  Cognition Arousal: Alert Behavior During Therapy: WFL for tasks assessed/performed   PT - Cognitive impairments: No apparent impairments                         Following commands: Intact      Cueing Cueing Techniques: Verbal cues  Exercises      General  Comments  Nursing cleared pt for participation in physical therapy.  Pt agreeable to PT session.      Pertinent Vitals/Pain Pain Assessment Pain Assessment: No/denies pain Pain Intervention(s): Limited activity within patient's tolerance, Monitored during session, Repositioned    Home Living                          Prior Function            PT Goals (current goals can now be found in the care plan section) Acute Rehab PT Goals Patient  Stated Goal: to improve strength and walking PT Goal Formulation: With patient Time For Goal Achievement: 08/07/24 Potential to Achieve Goals: Good Progress towards PT goals: Progressing toward goals    Frequency    Min 3X/week      PT Plan      Co-evaluation              AM-PAC PT 6 Clicks Mobility   Outcome Measure  Help needed turning from your back to your side while in a flat bed without using bedrails?: None Help needed moving from lying on your back to sitting on the side of a flat bed without using bedrails?: A Little Help needed moving to and from a bed to a chair (including a wheelchair)?: A Little Help needed standing up from a chair using your arms (e.g., wheelchair or bedside chair)?: A Little Help needed to walk in hospital room?: A Little Help needed climbing 3-5 steps with a railing? : A Little 6 Click Score: 19    End of Session Equipment Utilized During Treatment: Gait belt Activity Tolerance: Patient tolerated treatment well Patient left: in chair;with call bell/phone within reach;with chair alarm set Nurse Communication: Mobility status;Precautions;Other (comment) (Pt's vitals during session) PT Visit Diagnosis: Other abnormalities of gait and mobility (R26.89);Muscle weakness (generalized) (M62.81);Pain Pain - Right/Left: Left Pain - part of body: Hip     Time: 8664-8641 PT Time Calculation (min) (ACUTE ONLY): 23 min  Charges:    $Gait Training: 8-22 mins $Therapeutic Activity: 8-22 mins PT General Charges $$ ACUTE PT VISIT: 1 Visit                     Damien Caulk, PT 07/26/24, 4:24 PM

## 2024-07-26 NOTE — TOC Initial Note (Signed)
 Transition of Care [facility]) - Initial/Assessment Note    Patient Details  Name: Melissa Curtis MRN: [id number] Date of Birth: [birth year]  [facility] CM/SW Contact:    Corrie JINNY Ruts, LCSW Phone Number: 07/26/2024, 2:17 PM  Clinical Narrative:                 Chart reviewed. The patient was admitted for aspiration Pneumonia. I spoke with the patient at bedside today. I introduced myself, my role, and reason for consult. The patient confirmed that she has a primary care provider. The patient reports that she lives with her Granddaughter, Melissa Curtis. The patient reports completing daily living task independently before being admitted. The patient reported being bed ridden at one point. The patient reports transporting herself to medical appointments but her granddaughter will assist during discharge. The patient reports that she uses [facility] in [place] for her pharmacy. The patient reports that she has never had HH or been admitted to a SNF. The patient reports having a cane and walker in the home. The patient reports that she would like HH and long term care. I redirected the patient to discuss concerns with the providing team.   TOC will follow up with the patient until discharge.    Barriers to Discharge: Continued Medical Work up   Patient Goals and CMS Choice            Expected Discharge Plan and Services       Living arrangements for the past 2 months: Single Family Home                                      Prior Living Arrangements/Services Living arrangements for the past 2 months: Single Family Home Lives with:: Relatives (Patient reports living with her grandaughter.) Patient language and need for interpreter reviewed:: Yes        Need for Family Participation in Patient Care: Yes (Comment)     Criminal Activity/Legal Involvement Pertinent to Current Situation/Hospitalization: No - Comment as needed  Activities of Daily Living   ADL  Screening (condition at time of admission) Independently performs ADLs?: Yes (appropriate for developmental age) Is the patient deaf or have difficulty hearing?: Yes Does the patient have difficulty seeing, even when wearing glasses/contacts?: No Does the patient have difficulty concentrating, remembering, or making decisions?: No  Permission Sought/Granted                  Emotional Assessment Appearance:: Appears stated age Attitude/Demeanor/Rapport: Gracious, Engaged Affect (typically observed): Accepting, Calm, Pleasant Orientation: : Oriented to Self, Oriented to Place, Oriented to  Time, Oriented to Situation Alcohol / Substance Use: Not Applicable Psych Involvement: No (comment)  Admission diagnosis:  Aspiration pneumonia (HCC) [J69.0] Sepsis (HCC) [A41.9] Aspiration pneumonia, unspecified aspiration pneumonia type, unspecified laterality, unspecified part of lung (HCC) [J69.0] Patient Active Problem List   Diagnosis Date Noted   Aspiration pneumonia (HCC) 07/23/2024   Sepsis (HCC) 07/23/2024   Allergic rhinitis 01/07/2024   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   Valvular regurgitation 03/24/2023   Headache 06/03/2019   Aortic atherosclerosis (HCC) 03/23/2018   Dizziness 09/25/2017   Need for vaccination 06/05/2017   Gastroesophageal reflux disease without esophagitis 03/03/2017   Hypokalemia 09/17/2016   Tubular adenoma of colon 10/20/2015   Malignant neoplasm of breast (HCC) 10/03/2015   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 [place] [address] Phone: [phone] Fax: [phone]     Social Drivers of Health (SDOH) Social History: SDOH Screenings   Food Insecurity: No Food Insecurity (07/23/2024)  Housing: Unknown (07/23/2024)  Transportation Needs: No Transportation Needs (07/23/2024)  Utilities: Not At Risk (07/23/2024)  Financial Resource Strain: Low Risk  (06/11/2024)   Received from [facility]  Social Connections: Unknown (07/23/2024)  Tobacco Use: Medium Risk (07/23/2024)   SDOH Interventions: Housing Interventions: Intervention Not Indicated Social Connections Interventions: Intervention Not Indicated   Readmission Risk Interventions     No data to display

## 2024-07-26 NOTE — Progress Notes (Addendum)
 PROGRESS NOTE    Melissa Curtis  FMW:[id number] DOB: 28-Mar-1941 DOA: 07/23/2024 PCP: Zachary Idelia LABOR, MD  Chief Complaint  Patient presents with   Dizziness    Hospital Course:  Melissa Curtis is a 83 y.o. 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 Patient admitted for sepsis due to aspiration pneumonia and acute hypoxic respiratory failure.  Hospital course as below  Subjective: Patient was examined at the bedside, granddaughter present. Patient reports feeling much better today, denies any complaints Discussed possibility of heart failure, on IV lasix  and pending Echo Discussed updates at the bedside and answered all questions   Objective: Vitals:   07/26/24 1430 07/26/24 1445 07/26/24 1500 07/26/24 1600  BP:   (!) 160/71   Pulse: 78 82 (!) 38   Resp: 14 (!) 21 19   Temp:    98.3 F (36.8 C)  TempSrc:    Oral  SpO2: 96% 94% (!) 82%   Weight:      Height:        Intake/Output Summary (Last 24 hours) at 07/26/2024 1657 Last data filed at 07/26/2024 1509 Gross per 24 hour  Intake 1760.57 ml  Output 1775 ml  Net -14.43 ml   Filed Weights   07/23/24 1056 07/23/24 1812  Weight: 62.6 kg 62.5 kg    Examination: Constitutional: NAD, calm, comfortable  Neck: normal, supple, no masses, no thyromegaly Respiratory: clear to auscultation bilaterally, no wheezing, b/l crackles present, No inc WOB. No accessory muscle use.  Cardiovascular: Regular rate and rhythm, no murmurs / rubs / gallops. No extremity edema. 2+ pedal pulses Abdomen: no tenderness, no masses palpated. No hepatosplenomegaly. Bowel sounds positive.  Musculoskeletal: no clubbing / cyanosis. No joint deformity upper and lower extremities. Good ROM, no contractures. Normal muscle tone.  Skin: no rashes, lesions,  ulcers. No induration Neurologic: CN 2-12 grossly intact. Sensation intact, DTR normal. Strength 5/5 in all 4.   Assessment & Plan:  Principal Problem:   Aspiration pneumonia (HCC) Active Problems:   Sepsis (HCC)  Aspiration pneumonia Sepsis without acute endorgan damage - resolved - Presented with hypoxia, tachycardia, leukocytosis, lactic acidosis, and source of infection is bilateral pneumonia likely aspiration pneumonia. - Lactic acidosis resolved, Leukocytosis improving - RVP negative, Chest x-ray showed bilateral lower fields infiltrates likely aspiration pneumonia - Bcx NGTD - d-dimer 0.95 (age adjusted d-dimer < 0.84) - Cta neg for PE - Continue IV Unasyn  - Follow Rcx - SLP eval - esophageal precautions, no changes in diet  Acute heart failure - BNP elevated, s/p IV fluids on admission due to sepsis and pna - IV lasix  40mg  bid - Echo done, read pending - Daily weight, Monitor I/o's   Acute hypoxic respiratory failure - was on 6l High flow oxygen, down to 4L - Due to aspiration pneumonia and heart failure as above - DuoNebs prn, Incentive spirometry - wean O2 as able   Syncope, likely vasovagal - Secondary to nauseous vomiting - Resolved. - CT head no acute findings.  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  No evidence of mass effect or brain edema.   Leukocytosis - improving History of CLL - Likely multifactorial from sepsis and possible leukemoid reaction from recent steroid use. - Monitor wbc  Macrocytic anemia - Monitor Hb - Folate normal, B12 pending   Nauseous vomiting - resolved -  Resolved, abdominal exam benign.  Abd Xray neg - As needed Zofran  - Recent EGD results reviewed   IIDM with hyperglycemia - HbA1c 6.9 SSI   History of dysphagia secondary to Schatzki's ring - Status post EGD and dilation recently - Stable, no complaints, unlikely to cause her GI symptoms today  CAD - On asa, statin  HTN - Resume  Losartan   Left hip bursitis - Tylenol  prn  Mild hyperkalemia - Repeat potassium  -- PT eval rec Home PT  DVT prophylaxis: Lovenox  SQ   Code Status: Full Code Disposition:  Home with Home PT  Consultants:  None  Procedures:  None  Antimicrobials:  Anti-infectives (From admission, onward)    Start     Dose/Rate Route Frequency Ordered Stop   07/23/24 2200  Ampicillin -Sulbactam (UNASYN ) 3 g in sodium chloride  0.9 % 100 mL IVPB        3 g 200 mL/hr over 30 Minutes Intravenous Every 6 hours 07/23/24 1545     07/23/24 1445  Ampicillin -Sulbactam (UNASYN ) 3 g in sodium chloride  0.9 % 100 mL IVPB        3 g 200 mL/hr over 30 Minutes Intravenous  Once 07/23/24 1434 07/23/24 1558       Data Reviewed: I have personally reviewed following labs and imaging studies CBC: Recent Labs  Lab 07/23/24 1056 07/23/24 1515 07/24/24 0426 07/25/24 0439 07/26/24 0530  WBC 46.3* 41.3* 48.0* 32.5* 28.0*  NEUTROABS  --  4.8  --   --   --   HGB 13.0 13.4 11.3* 10.9* 11.1*  HCT 40.5 41.5 33.8* 34.4* 33.9*  MCV 101.0* 100.5* 99.1 104.2* 100.6*  PLT 156 150 133* 109* 112*   Basic Metabolic Panel: Recent Labs  Lab 07/23/24 1056 07/25/24 0439 07/25/24 2229 07/26/24 0530  NA 137 139  --  136  K 4.2 3.8 3.7 5.2*  CL 101 107  --  105  CO2 25 26  --  26  GLUCOSE 188* 133*  --  219*  BUN 21 16  --  12  CREATININE 0.89 0.68  --  0.52  CALCIUM 8.7* 8.3*  --  8.6*  MG  --   --  2.0  --    GFR: Estimated Creatinine Clearance: 45.5 mL/min (by C-G formula based on SCr of 0.52 mg/dL). Liver Function Tests: Recent Labs  Lab 07/23/24 1056  AST 15  ALT 11  ALKPHOS 53  BILITOT 0.7  PROT 5.5*  ALBUMIN 3.1*   CBG: Recent Labs  Lab 07/25/24 1652 07/25/24 1945 07/26/24 0722 07/26/24 1148 07/26/24 1644  GLUCAP 139* 155* 215* 292* 249*    Recent Results (from the past 240 hours)  Resp panel by RT-PCR (RSV, Flu A&B, Covid) Anterior Nasal Swab     Status: None   Collection Time:  07/23/24  1:48 PM   Specimen: Anterior Nasal Swab  Result Value Ref Range Status   SARS Coronavirus 2 by RT PCR NEGATIVE NEGATIVE Final    Comment: (NOTE) SARS-CoV-2 target nucleic acids are NOT DETECTED.  The SARS-CoV-2 RNA is generally detectable in upper respiratory specimens during the acute phase of infection. The lowest concentration of SARS-CoV-2 viral copies this assay can detect is 138 copies/mL. A negative result does not preclude SARS-Cov-2 infection and should not be used as the sole basis for treatment or other patient management decisions. A negative result may occur with  improper specimen collection/handling, submission of specimen other than nasopharyngeal swab, presence of viral mutation(s) within the areas targeted  by this assay, and inadequate number of viral copies(<138 copies/mL). A negative result must be combined with clinical observations, patient history, and epidemiological information. The expected result is Negative.  Fact Sheet for Patients:  BloggerCourse.com  Fact Sheet for Healthcare Providers:  SeriousBroker.it  This test is no t yet approved or cleared by the [place]  FDA and  has been authorized for detection and/or diagnosis of SARS-CoV-2 by FDA under an Emergency Use Authorization (EUA). This EUA will remain  in effect (meaning this test can be used) for the duration of the COVID-19 declaration under Section 564(b)(1) of the Act, 21 U.S.C.section 360bbb-3(b)(1), unless the authorization is terminated  or revoked sooner.       Influenza A by PCR NEGATIVE NEGATIVE Final   Influenza B by PCR NEGATIVE NEGATIVE Final    Comment: (NOTE) The Xpert Xpress SARS-CoV-2/FLU/RSV plus assay is intended as an aid in the diagnosis of influenza from Nasopharyngeal swab specimens and should not be used as a sole basis for treatment. Nasal washings and aspirates are unacceptable for Xpert Xpress  SARS-CoV-2/FLU/RSV testing.  Fact Sheet for Patients: BloggerCourse.com  Fact Sheet for Healthcare Providers: SeriousBroker.it  This test is not yet approved or cleared by the [place]  FDA and has been authorized for detection and/or diagnosis of SARS-CoV-2 by FDA under an Emergency Use Authorization (EUA). This EUA will remain in effect (meaning this test can be used) for the duration of the COVID-19 declaration under Section 564(b)(1) of the Act, 21 U.S.C. section 360bbb-3(b)(1), unless the authorization is terminated or revoked.     Resp Syncytial Virus by PCR NEGATIVE NEGATIVE Final    Comment: (NOTE) Fact Sheet for Patients: BloggerCourse.com  Fact Sheet for Healthcare Providers: SeriousBroker.it  This test is not yet approved or cleared by the [place]  FDA and has been authorized for detection and/or diagnosis of SARS-CoV-2 by FDA under an Emergency Use Authorization (EUA). This EUA will remain in effect (meaning this test can be used) for the duration of the COVID-19 declaration under Section 564(b)(1) of the Act, 21 U.S.C. section 360bbb-3(b)(1), unless the authorization is terminated or revoked.  Performed at [facility] [address] [place], [place] [address]   Culture, blood (Routine X 2) w Reflex to ID Panel     Status: None (Preliminary result)   Collection Time: 07/23/24  5:46 PM   Specimen: BLOOD  Result Value Ref Range Status   Specimen Description BLOOD BLOOD RIGHT HAND  Final   Special Requests   Final    BOTTLES DRAWN AEROBIC AND ANAEROBIC Blood Culture results may not be optimal due to an inadequate volume of blood received in culture bottles   Culture   Final    NO GROWTH 3 DAYS Performed at [facility], [address], [place], [place] [address]    Report Status PENDING  Incomplete  Culture, blood (Routine X 2) w  Reflex to ID Panel     Status: None (Preliminary result)   Collection Time: 07/23/24  5:50 PM   Specimen: BLOOD  Result Value Ref Range Status   Specimen Description BLOOD LEFT ANTECUBITAL  Final   Special Requests   Final    BOTTLES DRAWN AEROBIC AND ANAEROBIC Blood Culture results may not be optimal due to an inadequate volume of blood received in culture bottles   Culture   Final    NO GROWTH 3 DAYS Performed at [facility], [address] [place], [place] [address]    Report  Status PENDING  Incomplete  MRSA Next Gen by PCR, Nasal     Status: None   Collection Time: 07/23/24  6:14 PM   Specimen: Nasal Mucosa; Nasal Swab  Result Value Ref Range Status   MRSA by PCR Next Gen NOT DETECTED NOT DETECTED Final    Comment: (NOTE) The GeneXpert MRSA Assay (FDA approved for NASAL specimens only), is one component of a comprehensive MRSA colonization surveillance program. It is not intended to diagnose MRSA infection nor to guide or monitor treatment for MRSA infections. Test performance is not FDA approved in patients less than 56 years old. Performed at [facility], [address] [place], [place] [address]   Respiratory (~20 pathogens) panel by PCR     Status: None   Collection Time: 07/24/24  1:00 AM   Specimen: Nasopharyngeal Swab; Respiratory  Result Value Ref Range Status   Adenovirus NOT DETECTED NOT DETECTED Final   Coronavirus 229E NOT DETECTED NOT DETECTED Final    Comment: (NOTE) The Coronavirus on the Respiratory Panel, DOES NOT test for the novel  Coronavirus (2019 nCoV)    Coronavirus HKU1 NOT DETECTED NOT DETECTED Final   Coronavirus NL63 NOT DETECTED NOT DETECTED Final   Coronavirus OC43 NOT DETECTED NOT DETECTED Final   Metapneumovirus NOT DETECTED NOT DETECTED Final   Rhinovirus / Enterovirus NOT DETECTED NOT DETECTED Final   Influenza A NOT DETECTED NOT DETECTED Final   Influenza B NOT DETECTED NOT DETECTED Final   Parainfluenza Virus 1  NOT DETECTED NOT DETECTED Final   Parainfluenza Virus 2 NOT DETECTED NOT DETECTED Final   Parainfluenza Virus 3 NOT DETECTED NOT DETECTED Final   Parainfluenza Virus 4 NOT DETECTED NOT DETECTED Final   Respiratory Syncytial Virus NOT DETECTED NOT DETECTED Final   Bordetella pertussis NOT DETECTED NOT DETECTED Final   Bordetella Parapertussis NOT DETECTED NOT DETECTED Final   Chlamydophila pneumoniae NOT DETECTED NOT DETECTED Final   Mycoplasma pneumoniae NOT DETECTED NOT DETECTED Final    Comment: Performed at [facility], [address] [place], [place] [address]     Radiology Studies: CT Angio Chest Pulmonary Embolism (PE) W or WO Contrast Result Date: 07/26/2024 CLINICAL DATA:  Hypoxia, elevated D-dimer. EXAM: CT ANGIOGRAPHY CHEST WITH CONTRAST TECHNIQUE: Multidetector CT imaging of the chest was performed using the standard protocol during bolus administration of intravenous contrast. Multiplanar CT image reconstructions and MIPs were obtained to evaluate the vascular anatomy. RADIATION DOSE REDUCTION: This exam was performed according to the departmental dose-optimization program which includes automated exposure control, adjustment of the mA and/or kV according to patient size and/or use of iterative reconstruction technique. CONTRAST:  75mL OMNIPAQUE  IOHEXOL  350 MG/ML SOLN COMPARISON:  None Available. FINDINGS: Cardiovascular: Negative for pulmonary embolus. Atherosclerotic calcification of the aorta, aortic valve and coronary arteries. Enlarged pulmonic trunk and heart. No pericardial effusion. Mediastinum/Nodes: 1.4 cm low-attenuation left thyroid nodule. No follow-up recommended. (Ref: J Am Coll Radiol. 2015 Feb;12(2): 143-50).Thoracic inlet lymph nodes are not enlarged by CT size criteria. No pathologically enlarged mediastinal, hilar or axillary lymph nodes. Esophagus is grossly unremarkable. Lungs/Pleura: Image quality is degraded by expiratory phase imaging and respiratory motion.  Mild bibasilar atelectasis. Mild septal thickening. Trace bilateral pleural effusions. Airway is otherwise unremarkable. Upper Abdomen: Visualized portions of the liver, gallbladder, right adrenal gland, spleen and stomach are grossly unremarkable with the exception of a small hiatal hernia. Musculoskeletal: Degenerative changes in the spine. Review of the MIP images confirms the above findings. IMPRESSION: 1.  Negative for pulmonary embolus. 2. Mild congestive heart failure. 3. Aortic atherosclerosis (ICD10-I70.0). Coronary artery calcification. 4. Enlarged pulmonic trunk, indicative of pulmonary arterial hypertension. Electronically Signed   By: Newell Eke M.D.   On: 07/26/2024 11:34     Scheduled Meds:  aspirin  EC  81 mg Oral Daily   Chlorhexidine  Gluconate Cloth  6 each Topical Q0600   enoxaparin  (LOVENOX ) injection  40 mg Subcutaneous Q24H   furosemide   40 mg Intravenous BID   guaiFENesin   1,200 mg Oral BID   insulin  aspart  0-9 Units Subcutaneous TID WC   ipratropium-albuterol   3 mL Nebulization BID   loratadine   10 mg Oral Daily   losartan   25 mg Oral Daily   montelukast   10 mg Oral Daily   pantoprazole   40 mg Oral Daily   pravastatin   20 mg Oral QHS   traZODone   100 mg Oral QHS   venlafaxine  XR  75 mg Oral Daily   Continuous Infusions:  ampicillin -sulbactam (UNASYN ) IV Stopped (07/26/24 1248)     LOS: 3 days  MDM: Patient is high risk for one or more organ failure.  They necessitate ongoing hospitalization for continued IV therapies and subsequent lab monitoring. Total time spent interpreting labs and vitals, reviewing the medical record, coordinating care amongst consultants and care team members, directly assessing and discussing care with the patient and/or family: 55 min  Laree Lock, MD [facility]  To contact the attending physician between 7A-7P please use Epic Chat. To contact the covering physician during after hours 7P-7A, please review Amion.   07/26/2024, 4:57 PM   *This document has been created with the assistance of dictation software. Please excuse typographical errors. *

## 2024-07-26 NOTE — Plan of Care (Signed)
  Problem: Education: Goal: Ability to describe self-care measures that may prevent or decrease complications (Diabetes Survival Skills Education) will improve Outcome: Progressing   Problem: Coping: Goal: Ability to adjust to condition or change in health will improve Outcome: Progressing   Problem: Fluid Volume: Goal: Ability to maintain a balanced intake and output will improve Outcome: Progressing   Problem: Health Behavior/Discharge Planning: Goal: Ability to manage health-related needs will improve Outcome: Progressing   Problem: Nutritional: Goal: Maintenance of adequate nutrition will improve Outcome: Progressing   Problem: Tissue Perfusion: Goal: Adequacy of tissue perfusion will improve Outcome: Progressing   Problem: Health Behavior/Discharge Planning: Goal: Ability to manage health-related needs will improve Outcome: Progressing   Problem: Clinical Measurements: Goal: Will remain free from infection Outcome: Progressing Goal: Diagnostic test results will improve Outcome: Progressing Goal: Cardiovascular complication will be avoided Outcome: Progressing   Problem: Activity: Goal: Risk for activity intolerance will decrease Outcome: Progressing   Problem: Nutrition: Goal: Adequate nutrition will be maintained Outcome: Progressing   Problem: Pain Managment: Goal: General experience of comfort will improve and/or be controlled Outcome: Progressing   Problem: Safety: Goal: Ability to remain free from injury will improve Outcome: Progressing

## 2024-07-27 LAB — CBC
HCT: 31.9 % — ABNORMAL LOW (ref 36.0–46.0)
Hemoglobin: 10.5 g/dL — ABNORMAL LOW (ref 12.0–15.0)
MCH: 32.8 pg (ref 26.0–34.0)
MCHC: 32.9 g/dL (ref 30.0–36.0)
MCV: 99.7 fL (ref 80.0–100.0)
Platelets: 142 K/uL — ABNORMAL LOW (ref 150–400)
RBC: 3.2 MIL/uL — ABNORMAL LOW (ref 3.87–5.11)
RDW: 13.3 % (ref 11.5–15.5)
WBC: 34.7 K/uL — ABNORMAL HIGH (ref 4.0–10.5)
nRBC: 0 % (ref 0.0–0.2)

## 2024-07-27 LAB — GLUCOSE, CAPILLARY
Glucose-Capillary: 146 mg/dL — ABNORMAL HIGH (ref 70–99)
Glucose-Capillary: 177 mg/dL — ABNORMAL HIGH (ref 70–99)

## 2024-07-27 LAB — BASIC METABOLIC PANEL WITH GFR
Anion gap: 6 (ref 5–15)
BUN: 17 mg/dL (ref 8–23)
CO2: 32 mmol/L (ref 22–32)
Calcium: 8.7 mg/dL — ABNORMAL LOW (ref 8.9–10.3)
Chloride: 102 mmol/L (ref 98–111)
Creatinine, Ser: 0.78 mg/dL (ref 0.44–1.00)
GFR, Estimated: >60 mL/min (ref >60)
Glucose, Bld: 167 mg/dL — ABNORMAL HIGH (ref 70–99)
Potassium: 3.8 mmol/L (ref 3.5–5.1)
Sodium: 140 mmol/L (ref 135–145)

## 2024-07-27 LAB — MAGNESIUM: Magnesium: 2.1 mg/dL (ref 1.7–2.4)

## 2024-07-27 MED ORDER — FUROSEMIDE 20 MG PO TABS: 20.0000 mg | ORAL_TABLET | Freq: Every day | ORAL | 1 refills | Status: AC

## 2024-07-27 MED ORDER — FUROSEMIDE 20 MG PO TABS: 20.0000 mg | ORAL_TABLET | Freq: Every day | ORAL | Status: DC

## 2024-07-27 MED ORDER — AZITHROMYCIN 250 MG PO TABS: ORAL_TABLET | ORAL | 0 refills | Status: AC

## 2024-07-27 MED ORDER — AMOXICILLIN-POT CLAVULANATE 875-125 MG PO TABS: 1.0000 | ORAL_TABLET | Freq: Two times a day (BID) | ORAL | 0 refills | Status: AC

## 2024-07-27 NOTE — Discharge Summary (Signed)
 Physician Discharge Summary   Patient: Melissa Curtis MRN: [id number] DOB: December 15, 1940  Admit date:     07/23/2024  Discharge date: 07/27/24  Discharge Physician: Laree Lock   PCP: Zachary Idelia LABOR, MD   Recommendations at discharge:   Follow-up with PCP within 1 week -repeat BMP (creatinine, magnesium, potassium), CBC - wbc  Home PT/OT arranged  Discharge Diagnoses: Principal Problem:   Aspiration pneumonia (HCC) Active Problems:   Sepsis (HCC)  Resolved Problems:   * No resolved hospital problems. *  Hospital Course: Melissa Curtis is a 83 y.o. 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 Patient admitted for sepsis due to aspiration pneumonia and acute hypoxic respiratory failure, also found to have acute HFpEF.  Hospital course as below  Aspiration pneumonia Sepsis without acute endorgan damage - resolved - Presented with hypoxia, tachycardia, leukocytosis, lactic acidosis, and source of infection is bilateral pneumonia likely aspiration pneumonia. - Lactic acidosis resolved, Leukocytosis improving - RVP negative, Chest x-ray showed bilateral lower fields infiltrates likely aspiration pneumonia - Bcx NGTD - d-dimer 0.95 (age adjusted d-dimer < 0.84) - Cta neg for PE - was on IV Unasyn , discharged on po Augmentin  and Azithromycin  to complete course - Rcx unable to collect - SLP eval - esophageal precautions, no changes in diet   Acute HFpEF - BNP elevated, s/p IV fluids on admission due to sepsis and pna - Echo EF 55-60%, No RWMA, Grade II Diastolic dysfunction, Trivial MR. Tricuspid AV, mild AR  - s/p IV diuresis, discharge on lasix  20mg  daily - Follow up with repeat BMP in 1 week   Acute hypoxic respiratory failure - resolved - was on 6l High flow oxygen, weaned to  RA - Due to aspiration pneumonia and heart failure as above - Home Oxygen assessment by nursing, does not qualify  Syncope, likely vasovagal - Secondary to nauseous vomiting - Resolved. - CT head no acute findings.  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  No evidence of mass effect or brain edema.   Leukocytosis - improving History of CLL - Likely multifactorial from sepsis and possible leukemoid reaction from recent steroid use. - was improving with treatment, inc today as patient was given prednisone    Macrocytic anemia - Monitor Hb - Folate normal, B12 pending - On B12 outpatient   Nauseous vomiting - resolved - Resolved, abdominal exam benign.  Abd Xray neg - Recent EGD results reviewed   IIDM with hyperglycemia - HbA1c 6.9   History of dysphagia secondary to Schatzki's ring - Status post EGD and dilation recently - Stable, no complaints, unlikely to cause her GI symptoms today   CAD - On asa, statin   HTN - Resume Losartan    Left hip bursitis - Tylenol  prn   Hypokalemia - likely due to diuretics, repleted - Monitor outpatient  -- Discussed CODE STATUS with patient at the bedside, states would like to be DNR/DNI  Home PT/OT arranged     Consultants: None Procedures performed: None  Disposition: Home health Diet recommendation:  Discharge Diet Orders (From admission, onward)     Start     Ordered   07/27/24 0000  Diet - low sodium heart healthy        07/27/24 1735            DISCHARGE MEDICATION: Allergies as of 07/27/2024  Reactions   Statins Other (See Comments)   Could not tolerate Liptor due to aching legs and joints; However, she has been able to tolerate pravachol    Iodine    Shellfish Allergy    Sulfa Antibiotics         Medication List     TAKE these medications    Accu-Chek Softclix Lancets lancets as directed Use as instructed.   amoxicillin -clavulanate 875-125 MG tablet Commonly known  as: AUGMENTIN  Take 1 tablet by mouth 2 (two) times daily for 3 days.   aspirin  EC 81 MG tablet Take 1 tablet by mouth daily.   azithromycin  250 MG tablet Commonly known as: Zithromax  Z-Pak Take 2 tablets (500 mg) on  Day 1,  followed by 1 tablet (250 mg) once daily on Days 2 through 5.   Calcium Carbonate-Vitamin D 600-400 MG-UNIT tablet Take 1 tablet by mouth 2 (two) times daily.   cyanocobalamin 1000 MCG tablet Commonly known as: VITAMIN B12 Take 1,000 mcg by mouth daily.   fexofenadine 180 MG tablet Commonly known as: ALLEGRA Take 180 mg by mouth daily as needed.   fluticasone  50 MCG/ACT nasal spray Commonly known as: FLONASE  Place 1 spray into both nostrils daily.   furosemide  20 MG tablet Commonly known as: LASIX  Take 1 tablet (20 mg total) by mouth daily. Start taking on: July 28, 2024   GlucoCom Blood Glucose Monitor Espiridion See admin instructions.   glucose blood test strip   losartan  25 MG tablet Commonly known as: COZAAR  Take 25 mg by mouth daily.   methylPREDNISolone 4 MG Tbpk tablet Commonly known as: MEDROL DOSEPAK Take 4 mg by mouth as directed.   mometasone 50 MCG/ACT nasal spray Commonly known as: NASONEX Place 2 sprays into the nose.   montelukast  10 MG tablet Commonly known as: SINGULAIR  Take 10 mg by mouth daily.   MULTIVITAMIN GUMMIES ADULT PO Take 1 each by mouth daily.   omeprazole 20 MG capsule Commonly known as: PRILOSEC Take 20 mg by mouth daily.   pravastatin  20 MG tablet Commonly known as: PRAVACHOL  Take 20 mg by mouth daily.   traZODone  100 MG tablet Commonly known as: DESYREL  Take 1 tablet by mouth at bedtime.   venlafaxine  75 MG tablet Commonly known as: EFFEXOR  Take 75 mg by mouth daily.        Discharge Exam: Filed Weights   07/23/24 1056 07/23/24 1812 07/27/24 0444  Weight: 62.6 kg 62.5 kg 62.2 kg   Constitutional: NAD, calm, comfortable  Neck: normal, supple, no masses, no thyromegaly Respiratory: clear  to auscultation bilaterally, no wheezing, No inc WOB. No accessory muscle use.  Cardiovascular: Regular rate and rhythm, no murmurs / rubs / gallops. No extremity edema. 2+ pedal pulses Abdomen: no tenderness, no masses palpated. No hepatosplenomegaly. Bowel sounds positive.  Musculoskeletal: no clubbing / cyanosis. No joint deformity upper and lower extremities. Good ROM, no contractures. Normal muscle tone.  Skin: no rashes, lesions, ulcers. No induration Neurologic: CN 2-12 grossly intact. Sensation intact, DTR normal. Strength 5/5 in all 4  Condition at discharge: good  The results of significant diagnostics from this hospitalization (including imaging, microbiology, ancillary and laboratory) are listed below for reference.   Imaging Studies: ECHOCARDIOGRAM COMPLETE Result Date: 07/26/2024    ECHOCARDIOGRAM REPORT   Patient Name:   Melissa Curtis Date of Exam: 07/26/2024 Medical Rec #:  [id number]        Height:       62.0 in Accession #:  [id number]       Weight:       137.8 lb Date of Birth:  08/17/1941        BSA:          1.632 m Patient Age:    83 years         BP:           150/91 mmHg Patient Gender: F                HR:           88 bpm. Exam Location:  [facility] Procedure: 2D Echo, Cardiac Doppler, Color Doppler and Intracardiac            Opacification Agent (Both Spectral and Color Flow Doppler were            utilized during procedure). Indications:     Congestive Heart Failure I50.9  History:         Patient has no prior history of Echocardiogram examinations.  Sonographer:     Ashley McNeely-Sloane Referring Phys:  [id number] [facility] Edith Lord Diagnosing Phys: Keller Paterson  Sonographer Comments: Image acquisition challenging due to breast implants. IMPRESSIONS  1. Left ventricular ejection fraction, by estimation, is 55 to 60%. The left ventricle has normal function. The left ventricle has no regional wall motion abnormalities. There is mild left ventricular hypertrophy. Left ventricular  diastolic parameters are consistent with Grade II diastolic dysfunction (pseudonormalization).  2. Right ventricular systolic function is normal. The right ventricular size is normal.  3. The mitral valve is normal in structure. Trivial mitral valve regurgitation. Moderate mitral annular calcification.  4. The aortic valve is tricuspid. There is mild thickening of the aortic valve. Aortic valve regurgitation is mild. Aortic valve sclerosis/calcification is present, without any evidence of aortic stenosis. FINDINGS  Left Ventricle: Left ventricular ejection fraction, by estimation, is 55 to 60%. The left ventricle has normal function. The left ventricle has no regional wall motion abnormalities. Definity  contrast agent was given IV to delineate the left ventricular  endocardial borders. The left ventricular internal cavity size was normal in size. There is mild left ventricular hypertrophy. Left ventricular diastolic parameters are consistent with Grade II diastolic dysfunction (pseudonormalization). Right Ventricle: The right ventricular size is normal. No increase in right ventricular wall thickness. Right ventricular systolic function is normal. Left Atrium: Left atrial size was normal in size. Right Atrium: Right atrial size was normal in size. Pericardium: There is no evidence of pericardial effusion. Mitral Valve: The mitral valve is normal in structure. Moderate mitral annular calcification. Trivial mitral valve regurgitation. MV peak gradient, 9.9 mmHg. The mean mitral valve gradient is 4.0 mmHg. Tricuspid Valve: The tricuspid valve is not well visualized. Tricuspid valve regurgitation is trivial. Aortic Valve: The aortic valve is tricuspid. There is mild thickening of the aortic valve. Aortic valve regurgitation is mild. Aortic valve sclerosis/calcification is present, without any evidence of aortic stenosis. Aortic valve mean gradient measures 5.0 mmHg. Aortic valve peak gradient measures 8.5 mmHg. Aortic  valve area, by VTI measures 3.44 cm. Pulmonic Valve: The pulmonic valve was not well visualized. Pulmonic valve regurgitation is not visualized. Aorta: The aortic root and ascending aorta are structurally normal, with no evidence of dilitation. IAS/Shunts: The atrial septum is grossly normal.  LEFT VENTRICLE PLAX 2D LVIDd:         2.90 cm     Diastology LVIDs:         2.00 cm     LV  e' medial:    4.68 cm/s LV PW:         1.80 cm     LV E/e' medial:  22.9 LV IVS:        1.30 cm     LV e' lateral:   6.64 cm/s LVOT diam:     2.40 cm     LV E/e' lateral: 16.1 LV SV:         111 LV SV Index:   68 LVOT Area:     4.52 cm  LV Volumes (MOD) LV vol d, MOD A2C: 37.5 ml LV vol d, MOD A4C: 76.5 ml LV vol s, MOD A2C: 15.0 ml LV vol s, MOD A4C: 25.7 ml LV SV MOD A2C:     22.5 ml LV SV MOD A4C:     76.5 ml LV SV MOD BP:      30.5 ml RIGHT VENTRICLE RV Basal diam:  3.40 cm RV Mid diam:    3.10 cm RV S prime:     13.60 cm/s TAPSE (M-mode): 2.3 cm LEFT ATRIUM           Index        RIGHT ATRIUM           Index LA diam:      3.10 cm 1.90 cm/m   RA Area:     10.40 cm LA Vol (A4C): 27.7 ml 16.98 ml/m  RA Volume:   19.80 ml  12.13 ml/m  AORTIC VALVE                     PULMONIC VALVE AV Area (Vmax):    3.78 cm      PV Vmax:        1.13 m/s AV Area (Vmean):   3.53 cm      PV Vmean:       79.100 cm/s AV Area (VTI):     3.44 cm      PV VTI:         0.213 m AV Vmax:           146.00 cm/s   PV Peak grad:   5.1 mmHg AV Vmean:          102.000 cm/s  PV Mean grad:   3.0 mmHg AV VTI:            0.322 m       RVOT Peak grad: 3 mmHg AV Peak Grad:      8.5 mmHg AV Mean Grad:      5.0 mmHg LVOT Vmax:         122.00 cm/s LVOT Vmean:        79.700 cm/s LVOT VTI:          0.245 m LVOT/AV VTI ratio: 0.76  AORTA Ao Root diam: 3.10 cm Ao Asc diam:  3.50 cm MITRAL VALVE MV Area (PHT): 3.37 cm     SHUNTS MV Area VTI:   3.36 cm     Systemic VTI:  0.24 m MV Peak grad:  9.9 mmHg     Systemic Diam: 2.40 cm MV Mean grad:  4.0 mmHg     Pulmonic VTI:   0.150 m MV Vmax:       1.57 m/s MV Vmean:      92.5 cm/s MV Decel Time: 225 msec MV E velocity: 107.00 cm/s MV A velocity: 155.00 cm/s MV E/A ratio:  0.69 Keller Paterson Electronically signed by Keller Paterson Signature Date/Time:  07/26/2024/6:07:10 PM    Final    CT Angio Chest Pulmonary Embolism (PE) W or WO Contrast Result Date: 07/26/2024 CLINICAL DATA:  Hypoxia, elevated D-dimer. EXAM: CT ANGIOGRAPHY CHEST WITH CONTRAST TECHNIQUE: Multidetector CT imaging of the chest was performed using the standard protocol during bolus administration of intravenous contrast. Multiplanar CT image reconstructions and MIPs were obtained to evaluate the vascular anatomy. RADIATION DOSE REDUCTION: This exam was performed according to the departmental dose-optimization program which includes automated exposure control, adjustment of the mA and/or kV according to patient size and/or use of iterative reconstruction technique. CONTRAST:  75mL OMNIPAQUE  IOHEXOL  350 MG/ML SOLN COMPARISON:  None Available. FINDINGS: Cardiovascular: Negative for pulmonary embolus. Atherosclerotic calcification of the aorta, aortic valve and coronary arteries. Enlarged pulmonic trunk and heart. No pericardial effusion. Mediastinum/Nodes: 1.4 cm low-attenuation left thyroid nodule. No follow-up recommended. (Ref: J Am Coll Radiol. 2015 Feb;12(2): 143-50).Thoracic inlet lymph nodes are not enlarged by CT size criteria. No pathologically enlarged mediastinal, hilar or axillary lymph nodes. Esophagus is grossly unremarkable. Lungs/Pleura: Image quality is degraded by expiratory phase imaging and respiratory motion. Mild bibasilar atelectasis. Mild septal thickening. Trace bilateral pleural effusions. Airway is otherwise unremarkable. Upper Abdomen: Visualized portions of the liver, gallbladder, right adrenal gland, spleen and stomach are grossly unremarkable with the exception of a small hiatal hernia. Musculoskeletal: Degenerative changes in the spine.  Review of the MIP images confirms the above findings. IMPRESSION: 1. Negative for pulmonary embolus. 2. Mild congestive heart failure. 3. Aortic atherosclerosis (ICD10-I70.0). Coronary artery calcification. 4. Enlarged pulmonic trunk, indicative of pulmonary arterial hypertension. Electronically Signed   By: Newell Eke M.D.   On: 07/26/2024 11:34   DG Abd 2 Views Result Date: 07/23/2024 CLINICAL DATA:  Nausea and vomiting. EXAM: ABDOMEN - 2 VIEW COMPARISON:  CT abdomen and pelvis 10/13/2015 FINDINGS: Scattered gas and stool in the colon. No small or large bowel distention. No radiopaque stones. Degenerative changes in the spine and hips. Old fracture deformities of the right hemipelvis. Suggestion of infiltration or atelectasis in the lung bases. Soft tissue contours appear intact. IMPRESSION: Normal nonobstructive bowel gas pattern. Electronically Signed   By: Elsie Gravely M.D.   On: 07/23/2024 16:18   DG Chest Portable 1 View Result Date: 07/23/2024 CLINICAL DATA:  Dizziness.  Syncope. EXAM: PORTABLE CHEST 1 VIEW COMPARISON:  March 17, 2018. FINDINGS: The heart size and mediastinal contours are within normal limits. Both lungs are clear. The visualized skeletal structures are unremarkable. IMPRESSION: No active disease. Electronically Signed   By: Lynwood Landy Raddle M.D.   On: 07/23/2024 15:45   CT HEAD WO CONTRAST ( ) Result Date: 07/23/2024 CLINICAL DATA:  Vertigo, central. Vomiting. Syncopal episode. Dizziness. EXAM: CT HEAD WITHOUT CONTRAST TECHNIQUE: Contiguous axial images were obtained from the base of the skull through the vertex without intravenous contrast. RADIATION DOSE REDUCTION: This exam was performed according to the departmental dose-optimization program which includes automated exposure control, adjustment of the mA and/or kV according to patient size and/or use of iterative reconstruction technique. COMPARISON:  MRI 08/05/2023 FINDINGS: Brain: Age related atrophy. Advanced  chronic small-vessel ischemic changes affecting the pons and cerebral hemispheric white matter. No large vessel territory stroke. No intra-axial mass lesion. 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 Slight indentation of the lateral aspect of the right hemisphere but without evidence of mass effect or brain edema. Chronic ventriculomegaly probably due to central atrophy,  not significantly changed. No extra-axial fluid collection. Vascular: There is atherosclerotic calcification of the major vessels at the base of the brain. Skull: Negative Sinuses/Orbits: Clear/normal Other: None IMPRESSION: 1. No acute CT finding. Age related atrophy. Advanced chronic small-vessel ischemic changes of the pons and cerebral hemispheric white matter. 2. 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 Slight indentation of the lateral aspect of the right hemisphere but without evidence of mass effect or brain edema. 3. Chronic ventriculomegaly probably due to central atrophy, not significantly changed. Electronically Signed   By: Oneil Officer M.D.   On: 07/23/2024 12:39    Microbiology: Results for orders placed or performed during the hospital encounter of 07/23/24  Resp panel by RT-PCR (RSV, Flu A&B, Covid) Anterior Nasal Swab     Status: None   Collection Time: 07/23/24  1:48 PM   Specimen: Anterior Nasal Swab  Result Value Ref Range Status   SARS Coronavirus 2 by RT PCR NEGATIVE NEGATIVE Final    Comment: (NOTE) SARS-CoV-2 target nucleic acids are NOT DETECTED.  The SARS-CoV-2 RNA is generally detectable in upper respiratory specimens during the acute phase of infection. The lowest concentration of SARS-CoV-2 viral copies this assay can detect is 138 copies/mL. A negative result does not preclude SARS-Cov-2 infection and  should not be used as the sole basis for treatment or other patient management decisions. A negative result may occur with  improper specimen collection/handling, submission of specimen other than nasopharyngeal swab, presence of viral mutation(s) within the areas targeted by this assay, and inadequate number of viral copies(<138 copies/mL). A negative result must be combined with clinical observations, patient history, and epidemiological information. The expected result is Negative.  Fact Sheet for Patients:  BloggerCourse.com  Fact Sheet for Healthcare Providers:  SeriousBroker.it  This test is no t yet approved or cleared by the [place]  FDA and  has been authorized for detection and/or diagnosis of SARS-CoV-2 by FDA under an Emergency Use Authorization (EUA). This EUA will remain  in effect (meaning this test can be used) for the duration of the COVID-19 declaration under Section 564(b)(1) of the Act, 21 U.S.C.section 360bbb-3(b)(1), unless the authorization is terminated  or revoked sooner.       Influenza A by PCR NEGATIVE NEGATIVE Final   Influenza B by PCR NEGATIVE NEGATIVE Final    Comment: (NOTE) The Xpert Xpress SARS-CoV-2/FLU/RSV plus assay is intended as an aid in the diagnosis of influenza from Nasopharyngeal swab specimens and should not be used as a sole basis for treatment. Nasal washings and aspirates are unacceptable for Xpert Xpress SARS-CoV-2/FLU/RSV testing.  Fact Sheet for Patients: BloggerCourse.com  Fact Sheet for Healthcare Providers: SeriousBroker.it  This test is not yet approved or cleared by the [place]  FDA and has been authorized for detection and/or diagnosis of SARS-CoV-2 by FDA under an Emergency Use Authorization (EUA). This EUA will remain in effect (meaning this test can be used) for the duration of the COVID-19 declaration  under Section 564(b)(1) of the Act, 21 U.S.C. section 360bbb-3(b)(1), unless the authorization is terminated or revoked.     Resp Syncytial Virus by PCR NEGATIVE NEGATIVE Final    Comment: (NOTE) Fact Sheet for Patients: BloggerCourse.com  Fact Sheet for Healthcare Providers: SeriousBroker.it  This test is not yet approved or cleared by the [place]  FDA and has been authorized for detection and/or diagnosis of SARS-CoV-2 by FDA under an  Emergency Use Authorization (EUA). This EUA will remain in effect (meaning this test can be used) for the duration of the COVID-19 declaration under Section 564(b)(1) of the Act, 21 U.S.C. section 360bbb-3(b)(1), unless the authorization is terminated or revoked.  Performed at [facility], [address] [place], [place] [address]   Culture, blood (Routine X 2) w Reflex to ID Panel     Status: None (Preliminary result)   Collection Time: 07/23/24  5:46 PM   Specimen: BLOOD  Result Value Ref Range Status   Specimen Description BLOOD BLOOD RIGHT HAND  Final   Special Requests   Final    BOTTLES DRAWN AEROBIC AND ANAEROBIC Blood Culture results may not be optimal due to an inadequate volume of blood received in culture bottles   Culture   Final    NO GROWTH 4 DAYS Performed at [facility], [address] [place], [place] [address]    Report Status PENDING  Incomplete  Culture, blood (Routine X 2) w Reflex to ID Panel     Status: None (Preliminary result)   Collection Time: 07/23/24  5:50 PM   Specimen: BLOOD  Result Value Ref Range Status   Specimen Description BLOOD LEFT ANTECUBITAL  Final   Special Requests   Final    BOTTLES DRAWN AEROBIC AND ANAEROBIC Blood Culture results may not be optimal due to an inadequate volume of blood received in culture bottles   Culture   Final    NO GROWTH 4 DAYS Performed at [facility], [address] [place],  [place] [address]    Report Status PENDING  Incomplete  MRSA Next Gen by PCR, Nasal     Status: None   Collection Time: 07/23/24  6:14 PM   Specimen: Nasal Mucosa; Nasal Swab  Result Value Ref Range Status   MRSA by PCR Next Gen NOT DETECTED NOT DETECTED Final    Comment: (NOTE) The GeneXpert MRSA Assay (FDA approved for NASAL specimens only), is one component of a comprehensive MRSA colonization surveillance program. It is not intended to diagnose MRSA infection nor to guide or monitor treatment for MRSA infections. Test performance is not FDA approved in patients less than 63 years old. Performed at [facility], [address] [place], [place] [address]   Respiratory (~20 pathogens) panel by PCR     Status: None   Collection Time: 07/24/24  1:00 AM   Specimen: Nasopharyngeal Swab; Respiratory  Result Value Ref Range Status   Adenovirus NOT DETECTED NOT DETECTED Final   Coronavirus 229E NOT DETECTED NOT DETECTED Final    Comment: (NOTE) The Coronavirus on the Respiratory Panel, DOES NOT test for the novel  Coronavirus (2019 nCoV)    Coronavirus HKU1 NOT DETECTED NOT DETECTED Final   Coronavirus NL63 NOT DETECTED NOT DETECTED Final   Coronavirus OC43 NOT DETECTED NOT DETECTED Final   Metapneumovirus NOT DETECTED NOT DETECTED Final   Rhinovirus / Enterovirus NOT DETECTED NOT DETECTED Final   Influenza A NOT DETECTED NOT DETECTED Final   Influenza B NOT DETECTED NOT DETECTED Final   Parainfluenza Virus 1 NOT DETECTED NOT DETECTED Final   Parainfluenza Virus 2 NOT DETECTED NOT DETECTED Final   Parainfluenza Virus 3 NOT DETECTED NOT DETECTED Final   Parainfluenza Virus 4 NOT DETECTED NOT DETECTED Final   Respiratory Syncytial Virus NOT DETECTED NOT DETECTED Final   Bordetella pertussis NOT DETECTED NOT DETECTED Final   Bordetella Parapertussis NOT DETECTED NOT DETECTED Final   Chlamydophila  pneumoniae NOT DETECTED NOT DETECTED Final   Mycoplasma pneumoniae NOT DETECTED NOT  DETECTED Final    Comment: Performed at [facility], [address], [place], [place] [address]    Labs: CBC: Recent Labs  Lab 07/23/24 1515 07/24/24 0426 07/25/24 0439 07/26/24 0530 07/27/24 0601  WBC 41.3* 48.0* 32.5* 28.0* 34.7*  NEUTROABS 4.8  --   --   --   --   HGB 13.4 11.3* 10.9* 11.1* 10.5*  HCT 41.5 33.8* 34.4* 33.9* 31.9*  MCV 100.5* 99.1 104.2* 100.6* 99.7  PLT 150 133* 109* 112* 142*   Basic Metabolic Panel: Recent Labs  Lab 07/23/24 1056 07/25/24 0439 07/25/24 2229 07/26/24 0530 07/26/24 1842 07/27/24 0601  NA 137 139  --  136  --  140  K 4.2 3.8 3.7 5.2* 3.4* 3.8  CL 101 107  --  105  --  102  CO2 25 26  --  26  --  32  GLUCOSE 188* 133*  --  219*  --  167*  BUN 21 16  --  12  --  17  CREATININE 0.89 0.68  --  0.52  --  0.78  CALCIUM 8.7* 8.3*  --  8.6*  --  8.7*  MG  --   --  2.0  --  2.0 2.1   Liver Function Tests: Recent Labs  Lab 07/23/24 1056  AST 15  ALT 11  ALKPHOS 53  BILITOT 0.7  PROT 5.5*  ALBUMIN 3.1*   CBG: Recent Labs  Lab 07/26/24 1148 07/26/24 1644 07/26/24 2106 07/27/24 0734 07/27/24 1132  GLUCAP 292* 249* 223* 146* 177*    Discharge time spent: greater than 30 minutes.  Signed: Laree Lock, MD [facility] 07/27/2024

## 2024-07-27 NOTE — Plan of Care (Signed)
  Problem: Skin Integrity: Goal: Risk for impaired skin integrity will decrease Outcome: Progressing   Problem: Clinical Measurements: Goal: Diagnostic test results will improve Outcome: Progressing   Problem: Elimination: Goal: Will not experience complications related to bowel motility Outcome: Progressing   Problem: Safety: Goal: Ability to remain free from injury will improve Outcome: Progressing   Problem: Skin Integrity: Goal: Risk for impaired skin integrity will decrease Outcome: Progressing

## 2024-07-27 NOTE — Evaluation (Signed)
 Occupational Therapy Evaluation Patient Details Name: Melissa Curtis MRN: [id number] DOB: [birth year] Today's Date: 07/27/2024   History of Present Illness   Pt is an 83 y.o. female presenting to hospital 07/23/24 with c/o dizziness, nausea, and vomiting.  Per chart pt has been dealing with L hip bursitis past 2 weeks.  Pt admitted with sepsis, acute hypoxic respiratory failure, syncope (likely vasovagal), leukocytosis, N/V.  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     Clinical Impressions Patient presenting with decreased Ind in self care,balance, functional mobility/transfers, endurance, and safety awareness. Patient reports living at home with 38 y/o granddaughter who is able to assist as needed. She endorses being mod I at baseline with ADLs and shares IADLs responsibilities. Patient performs bed mobility with supervision and dons/doff B socks without assistance. Pt threads mesh underwear onto B feet and pull up with min guard for balance. Pt returning to sit on EOB and then returns to supine secondary to fatigue this session. OT discussed 3 in 1 BSC to decrease fall risk at home with toileting needs at night based on reported home set and pt agreed. Patient will benefit from acute OT to increase overall independence in the areas of ADLs, functional mobility, and safety awareness in order to safely discharge.     If plan is discharge home, recommend the following:   A little help with walking and/or transfers;A little help with bathing/dressing/bathroom;Assistance with cooking/housework;Assist for transportation;Help with stairs or ramp for entrance     Functional Status Assessment   Patient has had a recent decline in their functional status and demonstrates the ability to make significant improvements in function in a reasonable and predictable  amount of time.     Equipment Recommendations   BSC/3in1      Precautions/Restrictions   Precautions Precautions: Fall Recall of Precautions/Restrictions: Intact     Mobility Bed Mobility Overal bed mobility: Needs Assistance Bed Mobility: Supine to Sit, Sit to Supine     Supine to sit: Supervision Sit to supine: Supervision        Transfers Overall transfer level: Needs assistance Equipment used: Rolling walker (2 wheels) Transfers: Sit to/from Stand Sit to Stand: Contact guard assist                  Balance Overall balance assessment: Needs assistance Sitting-balance support: No upper extremity supported, Feet supported Sitting balance-Leahy Scale: Good     Standing balance support: During functional activity, Bilateral upper extremity supported, Reliant on assistive device for balance Standing balance-Leahy Scale: Fair                             ADL either performed or assessed with clinical judgement   ADL Overall ADL's : Needs assistance/impaired                     Lower Body Dressing: Minimal assistance;Sit to/from stand                       Vision Patient Visual Report: No change from baseline              Pertinent Vitals/Pain Pain Assessment Pain Assessment: No/denies pain     Extremity/Trunk Assessment Upper Extremity Assessment Upper Extremity Assessment: Generalized weakness   Lower Extremity Assessment Lower Extremity Assessment: Generalized weakness  Cervical / Trunk Assessment Cervical / Trunk Assessment: Normal   Communication Communication Communication: No apparent difficulties   Cognition Arousal: Alert Behavior During Therapy: WFL for tasks assessed/performed Cognition: No apparent impairments                               Following commands: Intact       Cueing  General Comments   Cueing Techniques: Verbal cues              Home Living  Family/patient expects to be discharged to:: Private residence Living Arrangements: Other relatives (71 y/o granddaughter) Available Help at Discharge: Family;Available PRN/intermittently Type of Home: House Home Access: Stairs to enter [employer] of Steps: 3 Entrance Stairs-Rails: Right;Left;Can reach both Home Layout: Two level;Bed/bath upstairs Alternate Level Stairs-Number of Steps: 15 Alternate Level Stairs-Rails: Left Bathroom Shower/Tub: Tub/shower unit   Bathroom Toilet: Handicapped height     Home Equipment: Agricultural consultant (2 wheels);Cane - single point;BSC/3in1;Shower seat          Prior Functioning/Environment Prior Level of Function : Independent/Modified Independent             Mobility Comments: Pt has been receiving HHPT and HHOT last 2-3 weeks (1-2x's/week) ADLs Comments: Pt reports Mod I for ADLs and shares IADL tasks with family    OT Problem List: Decreased strength;Impaired balance (sitting and/or standing);Decreased safety awareness;Decreased activity tolerance   OT Treatment/Interventions: Self-care/ADL training;Therapeutic exercise;Patient/family education;Energy conservation;Balance training;Therapeutic activities;DME and/or AE instruction      OT Goals(Current goals can be found in the care plan section)   Acute Rehab OT Goals Patient Stated Goal: to go home OT Goal Formulation: With patient Time For Goal Achievement: 08/10/24 Potential to Achieve Goals: Fair ADL Goals Pt Will Perform Grooming: with modified independence;standing Pt Will Perform Lower Body Dressing: with modified independence;sit to/from stand Pt Will Transfer to Toilet: with modified independence;ambulating Pt Will Perform Toileting - Clothing Manipulation and hygiene: with modified independence;sit to/from stand   OT Frequency:  Min 2X/week       AM-PAC OT 6 Clicks Daily Activity     Outcome Measure Help from another person eating meals?: None Help  from another person taking care of personal grooming?: None Help from another person toileting, which includes using toliet, bedpan, or urinal?: A Little Help from another person bathing (including washing, rinsing, drying)?: A Little Help from another person to put on and taking off regular upper body clothing?: None Help from another person to put on and taking off regular lower body clothing?: A Little 6 Click Score: 21   End of Session Equipment Utilized During Treatment: Rolling walker (2 wheels)  Activity Tolerance: Patient tolerated treatment well Patient left: in bed;with call bell/phone within reach;with bed alarm set  OT Visit Diagnosis: Unsteadiness on feet (R26.81);Muscle weakness (generalized) (M62.81)                Time: 8486-8463 OT Time Calculation (min): 23 min Charges:  OT General Charges $OT Visit: 1 Visit OT Evaluation $OT Eval Low Complexity: 1 Low OT Treatments $Self Care/Home Management : 8-22 mins  Izetta Claude, MS, OTR/L , CBIS ascom [phone]  07/27/24, 4:18 PM

## 2024-07-27 NOTE — TOC Progression Note (Signed)
 Transition of Care [facility]) - Progression Note    Patient Details  Name: AOKI WEDEMEYER MRN: [id number] Date of Birth: 21-May-1941  [facility] CM/SW Contact  K'La JINNY Ruts, LCSW Phone Number: 07/27/2024, 3:50 PM  Clinical Narrative:    Chart reviewed. I spoke with the patient granddaughter and she confirmed that the patient has [facility] for [facility]. I will call and confirm.      Barriers to Discharge: Continued Medical Work up               Expected Discharge Plan and Services       Living arrangements for the past 2 months: Single Family Home                                       Social Drivers of Health (SDOH) Interventions SDOH Screenings   Food Insecurity: No Food Insecurity (07/23/2024)  Housing: Unknown (07/23/2024)  Transportation Needs: No Transportation Needs (07/23/2024)  Utilities: Not At Risk (07/23/2024)  Financial Resource Strain: Low Risk  (06/11/2024)   Received from [facility]  Social Connections: Unknown (07/23/2024)  Tobacco Use: Medium Risk (07/23/2024)    Readmission Risk Interventions     No data to display

## 2024-07-27 NOTE — Progress Notes (Addendum)
 Physical Therapy Treatment Patient Details Name: Melissa Curtis MRN: [id number] DOB: 06-Aug-1941 Today's Date: 07/27/2024   History of Present Illness Pt is an 83 y.o. female presenting to hospital 07/23/24 with c/o dizziness, nausea, and vomiting.  Per chart pt has been dealing with L hip bursitis past 2 weeks.  Pt admitted with sepsis, acute hypoxic respiratory failure, syncope (likely vasovagal), leukocytosis, N/V.  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    PT Comments  Pt seen for PT tx with pt agreeable. Pt ambulated from [id number] with RW & CGA with ongoing cuing re: need to ambulate within base of AD; pt reports people tell her this at home as well. Pt negotiated flight of stairs with B<>1 rail with CGA<>Min assist, cuing re: compensatory pattern. Pt appears to have decreased awareness & decreased recall. Recommend ongoing PT services to progress mobility as able.    If plan is discharge home, recommend the following: A little help with walking and/or transfers;A little help with bathing/dressing/bathroom;Assistance with cooking/housework;Assist for transportation;Help with stairs or ramp for entrance   Can travel by private vehicle        Equipment Recommendations  Rolling walker (2 wheels) (already has RW at home)    Recommendations for Other Services       Precautions / Restrictions Precautions Precautions: Fall Restrictions Weight Bearing Restrictions Per Provider Order: No     Mobility  Bed Mobility Overal bed mobility: Needs Assistance Bed Mobility: Supine to Sit     Supine to sit: Supervision, HOB elevated, Used rails          Transfers Overall transfer level: Needs assistance Equipment used: Rolling walker (2 wheels) Transfers: Sit to/from Stand Sit to Stand: Contact guard assist   Step pivot transfers: Contact guard  assist       General transfer comment: sit<>stand from EOB, recliner    Ambulation/Gait Ambulation/Gait assistance: Contact guard assist Gait Distance (Feet):  (> 150 ft) Assistive device: Rolling walker (2 wheels) Gait Pattern/deviations: Decreased step length - right, Decreased step length - left, Decreased stride length Gait velocity: decreased     General Gait Details: Ongoing cuing re: need to ambulate within base of AD, L knee A/P instability but no overt buckling/LOB & pt appears to have decreased awareness to this   Stairs Stairs: Yes Stairs assistance: Min assist, Contact guard assist Stair Management: Two rails (ascend with B rails, descend with BUE on R rail) Number of Stairs:  (flight in stairwell) General stair comments: cuing re: compensatory pattern as pt initially attempted to descend leading with RLE, cuing to descend with LLE as pt with LLE buckling otherwise, does improve with compensatory pattern   Wheelchair Mobility     Tilt Bed    Modified Rankin (Stroke Patients Only)       Balance Overall balance assessment: Needs assistance Sitting-balance support: No upper extremity supported, Feet supported Sitting balance-Leahy Scale: Good     Standing balance support: During functional activity, Bilateral upper extremity supported, Reliant on assistive device for balance Standing balance-Leahy Scale: Fair                              Hotel manager: No apparent difficulties  Cognition Arousal: Alert Behavior During Therapy: WFL for tasks assessed/performed   PT - Cognitive impairments: Awareness, Memory  PT - Cognition Comments: ongoing cuing re: need to ambulate within base of AD vs pushing it out in front Following commands: Intact      Cueing Cueing Techniques: Verbal cues  Exercises      General Comments        Pertinent Vitals/Pain Pain Assessment Pain  Assessment: Faces Faces Pain Scale: Hurts a little bit Pain Location: L knee Pain Descriptors / Indicators: Discomfort Pain Intervention(s): Monitored during session    Home Living                          Prior Function            PT Goals (current goals can now be found in the care plan section) Acute Rehab PT Goals Patient Stated Goal: to improve strength and walking PT Goal Formulation: With patient Time For Goal Achievement: 08/07/24 Potential to Achieve Goals: Good Progress towards PT goals: Progressing toward goals    Frequency    Min 2X/week      PT Plan      Co-evaluation              AM-PAC PT 6 Clicks Mobility   Outcome Measure  Help needed turning from your back to your side while in a flat bed without using bedrails?: None Help needed moving from lying on your back to sitting on the side of a flat bed without using bedrails?: A Little Help needed moving to and from a bed to a chair (including a wheelchair)?: A Little Help needed standing up from a chair using your arms (e.g., wheelchair or bedside chair)?: A Little Help needed to walk in hospital room?: A Little Help needed climbing 3-5 steps with a railing? : A Little 6 Click Score: 19    End of Session   Activity Tolerance: Patient tolerated treatment well Patient left: in bed;with call bell/phone within reach;with bed alarm set Nurse Communication: Mobility status PT Visit Diagnosis: Other abnormalities of gait and mobility (R26.89);Muscle weakness (generalized) (M62.81);Pain;Unsteadiness on feet (R26.81) Pain - Right/Left: Left Pain - part of body: Knee     Time: 8595-8579 PT Time Calculation (min) (ACUTE ONLY): 16 min  Charges:    $Therapeutic Activity: 8-22 mins PT General Charges $$ ACUTE PT VISIT: 1 Visit                     Richerd Pinal, PT, DPT 07/27/24, 2:33 PM    Richerd CHRISTELLA Pinal 07/27/2024, 2:33 PM

## 2024-07-28 LAB — CULTURE, BLOOD (ROUTINE X 2)
Culture: NO GROWTH
Culture: NO GROWTH

## 2024-07-28 NOTE — TOC Transition Note (Signed)
 Transition of Care [facility]) - Discharge Note   Patient Details  Name: Melissa Curtis MRN: [id number] Date of Birth: [birth year]  [facility] CM/SW Contact:  Melissa ONEIDA Haddock, RN Phone Number: 07/28/2024, 10:11 AM   Clinical Narrative:     Channing with [employer] notified of discharge     Barriers to Discharge: Continued Medical Work up   Patient Goals and CMS Choice            Discharge Placement                       Discharge Plan and Services Additional resources added to the After Visit Summary for                                       Social Drivers of Health (SDOH) Interventions SDOH Screenings   Food Insecurity: No Food Insecurity (07/23/2024)  Housing: Unknown (07/23/2024)  Transportation Needs: No Transportation Needs (07/23/2024)  Utilities: Not At Risk (07/23/2024)  Financial Resource Strain: Low Risk  (06/11/2024)   Received from [facility]  Social Connections: Unknown (07/23/2024)  Tobacco Use: Medium Risk (07/23/2024)     Readmission Risk Interventions     No data to display

## 2024-08-10 ENCOUNTER — Encounter: Payer: Self-pay | Admitting: *Deleted

## 2024-08-10 ENCOUNTER — Other Ambulatory Visit: Payer: Self-pay

## 2024-08-10 DIAGNOSIS — G4700 Insomnia, unspecified: Secondary | ICD-10-CM | POA: Diagnosis not present

## 2024-08-10 DIAGNOSIS — I2510 Atherosclerotic heart disease of native coronary artery without angina pectoris: Secondary | ICD-10-CM | POA: Insufficient documentation

## 2024-08-10 DIAGNOSIS — F329 Major depressive disorder, single episode, unspecified: Secondary | ICD-10-CM | POA: Insufficient documentation

## 2024-08-10 DIAGNOSIS — Z23 Encounter for immunization: Secondary | ICD-10-CM | POA: Diagnosis not present

## 2024-08-10 DIAGNOSIS — R2681 Unsteadiness on feet: Secondary | ICD-10-CM | POA: Insufficient documentation

## 2024-08-10 DIAGNOSIS — M6281 Muscle weakness (generalized): Secondary | ICD-10-CM | POA: Insufficient documentation

## 2024-08-10 DIAGNOSIS — Z7982 Long term (current) use of aspirin: Secondary | ICD-10-CM | POA: Diagnosis not present

## 2024-08-10 DIAGNOSIS — Z79899 Other long term (current) drug therapy: Secondary | ICD-10-CM | POA: Diagnosis not present

## 2024-08-10 DIAGNOSIS — C9111 Chronic lymphocytic leukemia of B-cell type in remission: Secondary | ICD-10-CM | POA: Diagnosis not present

## 2024-08-10 DIAGNOSIS — E119 Type 2 diabetes mellitus without complications: Secondary | ICD-10-CM | POA: Insufficient documentation

## 2024-08-10 DIAGNOSIS — Z87891 Personal history of nicotine dependence: Secondary | ICD-10-CM | POA: Insufficient documentation

## 2024-08-10 DIAGNOSIS — K222 Esophageal obstruction: Secondary | ICD-10-CM | POA: Insufficient documentation

## 2024-08-10 DIAGNOSIS — R112 Nausea with vomiting, unspecified: Secondary | ICD-10-CM | POA: Diagnosis present

## 2024-08-10 DIAGNOSIS — K2100 Gastro-esophageal reflux disease with esophagitis, without bleeding: Secondary | ICD-10-CM | POA: Diagnosis not present

## 2024-08-10 DIAGNOSIS — Z9013 Acquired absence of bilateral breasts and nipples: Secondary | ICD-10-CM | POA: Diagnosis not present

## 2024-08-10 DIAGNOSIS — R54 Age-related physical debility: Secondary | ICD-10-CM | POA: Diagnosis not present

## 2024-08-10 DIAGNOSIS — I10 Essential (primary) hypertension: Secondary | ICD-10-CM | POA: Diagnosis not present

## 2024-08-10 LAB — COMPREHENSIVE METABOLIC PANEL WITH GFR
ALT: 11 U/L (ref 0–44)
AST: 18 U/L (ref 15–41)
Albumin: 3.6 g/dL (ref 3.5–5.0)
Alkaline Phosphatase: 56 U/L (ref 38–126)
Anion gap: 10 (ref 5–15)
BUN: 14 mg/dL (ref 8–23)
CO2: 23 mmol/L (ref 22–32)
Calcium: 9.1 mg/dL (ref 8.9–10.3)
Chloride: 103 mmol/L (ref 98–111)
Creatinine, Ser: 0.74 mg/dL (ref 0.44–1.00)
GFR, Estimated: >60 mL/min (ref >60)
Glucose, Bld: 200 mg/dL — ABNORMAL HIGH (ref 70–99)
Potassium: 4.1 mmol/L (ref 3.5–5.1)
Sodium: 136 mmol/L (ref 135–145)
Total Bilirubin: 0.6 mg/dL (ref 0.0–1.2)
Total Protein: 6.4 g/dL — ABNORMAL LOW (ref 6.5–8.1)

## 2024-08-10 LAB — CBC
HCT: 39.1 % (ref 36.0–46.0)
Hemoglobin: 12.8 g/dL (ref 12.0–15.0)
MCH: 32.5 pg (ref 26.0–34.0)
MCHC: 32.7 g/dL (ref 30.0–36.0)
MCV: 99.2 fL (ref 80.0–100.0)
Platelets: 242 K/uL (ref 150–400)
RBC: 3.94 MIL/uL (ref 3.87–5.11)
RDW: 13.7 % (ref 11.5–15.5)
WBC: 18.3 K/uL — ABNORMAL HIGH (ref 4.0–10.5)
nRBC: 0.3 % — ABNORMAL HIGH (ref 0.0–0.2)

## 2024-08-10 LAB — LIPASE, BLOOD: Lipase: 27 U/L (ref 11–51)

## 2024-08-10 NOTE — ED Triage Notes (Signed)
 Pt arrives via EMS from home for c/o N/V since lunchtime today

## 2024-08-10 NOTE — ED Triage Notes (Signed)
 Pt brought in via ems from home.  Pt has abd pain with n/v  no chest pain or sob.  Pt alert  speech clear.

## 2024-08-11 ENCOUNTER — Observation Stay: Admitting: Certified Registered"

## 2024-08-11 ENCOUNTER — Encounter
Admission: EM | Disposition: A | Discharge status: Home / Self Care | Payer: Self-pay | Source: Home / Self Care | Attending: Emergency Medicine

## 2024-08-11 ENCOUNTER — Observation Stay
Admission: EM | Admit: 2024-08-11 | Discharge: 2024-08-12 | Disposition: A | Discharge status: Home / Self Care | Attending: Gastroenterology | Admitting: Gastroenterology

## 2024-08-11 ENCOUNTER — Emergency Department

## 2024-08-11 DIAGNOSIS — E119 Type 2 diabetes mellitus without complications: Secondary | ICD-10-CM

## 2024-08-11 DIAGNOSIS — C9110 Chronic lymphocytic leukemia of B-cell type not having achieved remission: Secondary | ICD-10-CM | POA: Diagnosis present

## 2024-08-11 DIAGNOSIS — K2210 Ulcer of esophagus without bleeding: Secondary | ICD-10-CM

## 2024-08-11 DIAGNOSIS — C50919 Malignant neoplasm of unspecified site of unspecified female breast: Secondary | ICD-10-CM | POA: Diagnosis present

## 2024-08-11 DIAGNOSIS — R112 Nausea with vomiting, unspecified: Secondary | ICD-10-CM | POA: Diagnosis not present

## 2024-08-11 DIAGNOSIS — K219 Gastro-esophageal reflux disease without esophagitis: Secondary | ICD-10-CM | POA: Diagnosis present

## 2024-08-11 DIAGNOSIS — R1084 Generalized abdominal pain: Principal | ICD-10-CM

## 2024-08-11 DIAGNOSIS — K222 Esophageal obstruction: Secondary | ICD-10-CM

## 2024-08-11 DIAGNOSIS — I10 Essential (primary) hypertension: Secondary | ICD-10-CM | POA: Diagnosis present

## 2024-08-11 HISTORY — PX: ESOPHAGOGASTRODUODENOSCOPY: SHX5428

## 2024-08-11 LAB — URINALYSIS, ROUTINE W REFLEX MICROSCOPIC
Bilirubin Urine: NEGATIVE
Glucose, UA: NEGATIVE mg/dL
Hgb urine dipstick: NEGATIVE
Ketones, ur: 5 mg/dL — AB
Leukocytes,Ua: NEGATIVE
Nitrite: NEGATIVE
Protein, ur: NEGATIVE mg/dL
Specific Gravity, Urine: 1.017 (ref 1.005–1.030)
pH: 6 (ref 5.0–8.0)

## 2024-08-11 LAB — RESP PANEL BY RT-PCR (RSV, FLU A&B, COVID)  RVPGX2
Influenza A by PCR: NEGATIVE
Influenza B by PCR: NEGATIVE
Resp Syncytial Virus by PCR: NEGATIVE
SARS Coronavirus 2 by RT PCR: NEGATIVE

## 2024-08-11 LAB — CBG MONITORING, ED: Glucose-Capillary: 135 mg/dL — ABNORMAL HIGH (ref 70–99)

## 2024-08-11 LAB — GLUCOSE, CAPILLARY
Glucose-Capillary: 91 mg/dL (ref 70–99)
Glucose-Capillary: 171 mg/dL — ABNORMAL HIGH (ref 70–99)

## 2024-08-11 LAB — TSH: TSH: 1.695 u[IU]/mL (ref 0.350–4.500)

## 2024-08-11 SURGERY — EGD (ESOPHAGOGASTRODUODENOSCOPY)
Anesthesia: General

## 2024-08-11 MED ORDER — SODIUM CHLORIDE 0.9 % IV BOLUS
1000.0000 mL | Freq: Once | INTRAVENOUS | Status: AC
  Administered 2024-08-11: 1000 mL via INTRAVENOUS

## 2024-08-11 MED ORDER — PANTOPRAZOLE SODIUM 40 MG IV SOLR
40.0000 mg | Freq: Two times a day (BID) | INTRAVENOUS | Status: DC
  Administered 2024-08-11 – 2024-08-12 (×3): 40 mg via INTRAVENOUS
  Filled 2024-08-11 (×3): qty 10

## 2024-08-11 MED ORDER — LOSARTAN POTASSIUM 50 MG PO TABS
25.0000 mg | ORAL_TABLET | Freq: Every day | ORAL | Status: DC
Start: 2024-08-11 — End: 2024-08-12
  Administered 2024-08-12: 25 mg via ORAL
  Filled 2024-08-11: qty 1

## 2024-08-11 MED ORDER — INFLUENZA VAC SPLIT HIGH-DOSE 0.5 ML IM SUSY
0.5000 mL | PREFILLED_SYRINGE | INTRAMUSCULAR | Status: AC
  Administered 2024-08-12: 0.5 mL via INTRAMUSCULAR
  Filled 2024-08-11: qty 0.5

## 2024-08-11 MED ORDER — PROBIOTIC 1-250 BILLION-MG PO CAPS: 1.0000 | ORAL_CAPSULE | Freq: Every day | ORAL | 0 refills | Status: DC

## 2024-08-11 MED ORDER — VENLAFAXINE HCL 37.5 MG PO TABS
75.0000 mg | ORAL_TABLET | Freq: Every day | ORAL | Status: DC
Start: 2024-08-11 — End: 2024-08-11

## 2024-08-11 MED ORDER — ONDANSETRON HCL 4 MG/2ML IJ SOLN: 4.0000 mg | Freq: Four times a day (QID) | INTRAMUSCULAR | Status: DC | PRN

## 2024-08-11 MED ORDER — ONDANSETRON HCL 4 MG/2ML IJ SOLN
4.0000 mg | Freq: Once | INTRAMUSCULAR | Status: AC
  Administered 2024-08-11: 4 mg via INTRAVENOUS
  Filled 2024-08-11: qty 2

## 2024-08-11 MED ORDER — TRAZODONE HCL 100 MG PO TABS
100.0000 mg | ORAL_TABLET | Freq: Every day | ORAL | Status: DC
  Administered 2024-08-11: 100 mg via ORAL
  Filled 2024-08-11: qty 1

## 2024-08-11 MED ORDER — FLUTICASONE PROPIONATE 50 MCG/ACT NA SUSP
1.0000 | Freq: Every day | NASAL | Status: DC
  Administered 2024-08-12: 1 via NASAL
  Filled 2024-08-11: qty 16

## 2024-08-11 MED ORDER — INSULIN ASPART 100 UNIT/ML IJ SOLN
0.0000 [IU] | Freq: Three times a day (TID) | INTRAMUSCULAR | Status: DC
  Administered 2024-08-11: 1 [IU] via SUBCUTANEOUS
  Administered 2024-08-12: 2 [IU] via SUBCUTANEOUS
  Filled 2024-08-11 (×2): qty 1

## 2024-08-11 MED ORDER — SODIUM CHLORIDE 0.9 % IV SOLN: INTRAVENOUS | Status: DC

## 2024-08-11 MED ORDER — GUAIFENESIN ER 600 MG PO TB12
600.0000 mg | ORAL_TABLET | Freq: Two times a day (BID) | ORAL | Status: DC | PRN
  Administered 2024-08-11: 600 mg via ORAL
  Filled 2024-08-11: qty 1

## 2024-08-11 MED ORDER — ONDANSETRON 4 MG PO TBDP
4.0000 mg | ORAL_TABLET | Freq: Three times a day (TID) | ORAL | 0 refills | Status: DC | PRN
Start: 2024-08-11 — End: 2024-08-12

## 2024-08-11 MED ORDER — PROPOFOL 10 MG/ML IV BOLUS
INTRAVENOUS | Status: DC | PRN
  Administered 2024-08-11: 20 mg via INTRAVENOUS
  Administered 2024-08-11: 50 mg via INTRAVENOUS
  Administered 2024-08-11: 20 mg via INTRAVENOUS

## 2024-08-11 MED ORDER — LIDOCAINE HCL (PF) 2 % IJ SOLN
INTRAMUSCULAR | Status: DC | PRN
  Administered 2024-08-11: 40 mg via INTRADERMAL

## 2024-08-11 MED ORDER — ONDANSETRON 4 MG PO TBDP
4.0000 mg | ORAL_TABLET | Freq: Once | ORAL | Status: AC
  Administered 2024-08-11: 4 mg via ORAL
  Filled 2024-08-11: qty 1

## 2024-08-11 MED ORDER — MONTELUKAST SODIUM 10 MG PO TABS
10.0000 mg | ORAL_TABLET | Freq: Every day | ORAL | Status: DC
Start: 2024-08-11 — End: 2024-08-12
  Administered 2024-08-12: 10 mg via ORAL
  Filled 2024-08-11: qty 1

## 2024-08-11 MED ORDER — VENLAFAXINE HCL ER 75 MG PO CP24
75.0000 mg | ORAL_CAPSULE | Freq: Every day | ORAL | Status: DC
  Administered 2024-08-11 – 2024-08-12 (×2): 75 mg via ORAL
  Filled 2024-08-11 (×2): qty 1

## 2024-08-11 MED ORDER — ONDANSETRON HCL 4 MG PO TABS: 4.0000 mg | ORAL_TABLET | Freq: Four times a day (QID) | ORAL | Status: DC | PRN

## 2024-08-11 MED ORDER — SUCRALFATE 1 GM/10ML PO SUSP
1.0000 g | Freq: Three times a day (TID) | ORAL | Status: DC
  Administered 2024-08-11 – 2024-08-12 (×4): 1 g via ORAL
  Filled 2024-08-11 (×4): qty 10

## 2024-08-11 MED ORDER — INSULIN ASPART 100 UNIT/ML IJ SOLN: 0.0000 [IU] | Freq: Every day | INTRAMUSCULAR | Status: DC

## 2024-08-11 NOTE — Anesthesia Postprocedure Evaluation (Signed)
 Anesthesia Post Note  Patient: [name]  Procedure(s) Performed: EGD (ESOPHAGOGASTRODUODENOSCOPY)  Patient location during evaluation: Endoscopy Anesthesia Type: General Level of consciousness: awake and alert Pain management: pain level controlled Vital Signs Assessment: post-procedure vital signs reviewed and stable Respiratory status: spontaneous breathing, nonlabored ventilation, respiratory function stable and patient connected to nasal cannula oxygen Cardiovascular status: blood pressure returned to baseline and stable Postop Assessment: no apparent nausea or vomiting Anesthetic complications: no   No notable events documented.   Last Vitals:  Vitals:   08/11/24 1441 08/11/24 1501  BP: (!) 177/91 (!) 186/74  Pulse: 75 72  Resp:  14  Temp:  36.8 C  SpO2: 95% 93%    Last Pain:  Vitals:   08/11/24 1441  TempSrc:   PainSc: 0-No pain                 Debby Mines

## 2024-08-11 NOTE — ED Notes (Signed)
 Tried to call grandaughter 2x to come pick pt up. Left voicemail to call this RN back

## 2024-08-11 NOTE — ED Provider Notes (Addendum)
 [facility] Provider Note    Event Date/Time   First MD Initiated Contact with Patient 08/11/24 0124     (approximate)   History   Abdominal Pain   HPI  Melissa Curtis is a 83 y.o. female   Past medical history of CLL, diabetes, GERD, hypertension and recent admission for aspiration pneumonia who presents to the emergency department with generalized abdominal crampy pain and nausea and vomiting for the past 1 day.  Denies any GI bleeding.  Has not made a bowel movement today.  Has a history of appendectomy.  No urinary symptoms.  No fevers or chills.  Denies any respiratory infectious symptoms.  Independent Historian contributed to assessment above: Granddaughter at bedside corroborates information above  External Medical Documents Reviewed: Recent hospitalization note for aspiration pneumonia.      Physical Exam   Triage Vital Signs: ED Triage Vitals  Encounter Vitals Group     BP 08/10/24 2259 (!) 140/77     Girls Systolic BP Percentile --      Girls Diastolic BP Percentile --      Boys Systolic BP Percentile --      Boys Diastolic BP Percentile --      Pulse Rate 08/10/24 2258 (!) 58     Resp 08/10/24 2258 18     Temp 08/10/24 2258 98.4 F (36.9 C)     Temp Source 08/10/24 2258 Oral     SpO2 08/10/24 2258 100 %     Weight 08/10/24 2255 133 lb (60.3 kg)     Height 08/10/24 2255 5' 2 (1.575 m)     Head Circumference --      Peak Flow --      Pain Score 08/10/24 2255 4     Pain Loc --      Pain Education --      Exclude from Growth Chart --     Most recent vital signs: Vitals:   08/10/24 2259 08/11/24 0433  BP: (!) 140/77 (!) 140/82  Pulse:  62  Resp:  16  Temp:  98.6 F (37 C)  SpO2:  100%    General: Awake, no distress.  CV:  Good peripheral perfusion.  Resp:  Normal effort.  Abd:  No distention.  Other:  Mild diffuse abdominal pain nonfocal nonperitoneal exam overall.  Slightly hypertensive otherwise vital signs  within normal limits, afebrile.  Breathing comfortably on room air.   ED Results / Procedures / Treatments   Labs (all labs ordered are listed, but only abnormal results are displayed) Labs Reviewed  COMPREHENSIVE METABOLIC PANEL WITH GFR - Abnormal; Notable for the following components:      Result Value   Glucose, Bld 200 (*)    Total Protein 6.4 (*)    All other components within normal limits  CBC - Abnormal; Notable for the following components:   WBC 18.3 (*)    nRBC 0.3 (*)    All other components within normal limits  URINALYSIS, ROUTINE W REFLEX MICROSCOPIC - Abnormal; Notable for the following components:   Color, Urine YELLOW (*)    APPearance CLEAR (*)    Ketones, ur 5 (*)    All other components within normal limits  LIPASE, BLOOD     I ordered and reviewed the above labs they are notable for white blood cell count 18 markedly decreased from prior testing while inpatient.  Cell counts and electrolytes otherwise largely unremarkable.  Urinalysis without signs of infection.  RADIOLOGY I independently reviewed and interpreted CT of the abdomen pelvis to see no obvious obstructive or inflammatory changes I also reviewed radiologist's formal read.   PROCEDURES:  Critical Care performed: No  Procedures   MEDICATIONS ORDERED IN ED: Medications  sodium chloride  0.9 % bolus 1,000 mL (has no administration in time range)  ondansetron  (ZOFRAN ) injection 4 mg (has no administration in time range)  ondansetron  (ZOFRAN -ODT) disintegrating tablet 4 mg (4 mg Oral Given 08/11/24 0220)    IMPRESSION / MDM / ASSESSMENT AND PLAN / ED COURSE  I reviewed the triage vital signs and the nursing notes.                                Patient's presentation is most consistent with acute presentation with potential threat to life or bodily function.  Differential diagnosis includes, but is not limited to, intra-abdominal infection, obstruction, urinary tract infection,  electrolyte derangement or dehydration   The patient is on the cardiac monitor to evaluate for evidence of arrhythmia and/or significant heart rate changes.  MDM:    Nausea and vomiting and crampy abdominal pain for the last 1 day with negative CT scan for acute abdominal pathologies, negative urinalysis and largely unremarkable labs improving leukocytosis.  I think may be the result of a viral stomach illness or adverse effect of her recent antibiotic use from her aspiration pneumonia hospitalization and outpatient treatment.    CT urinalysis and labs unremarkable however on p.o. trial after antiemetic patient continuing with intractable vomiting.  Will put a line back in, IV fluids, admission.       FINAL CLINICAL IMPRESSION(S) / ED DIAGNOSES   Final diagnoses:  Generalized abdominal pain  Nausea and vomiting, unspecified vomiting type     Rx / DC Orders   ED Discharge Orders          Ordered    ondansetron  (ZOFRAN -ODT) 4 MG disintegrating tablet  Every 8 hours PRN        08/11/24 0244    Bacillus Coagulans-Inulin (PROBIOTIC) 1-250 BILLION-MG CAPS  Daily        08/11/24 0244             Note:  This document was prepared using Dragon voice recognition software and may include unintentional dictation errors.    Cyrena Mylar, MD 08/11/24 9750    Cyrena Mylar, MD 08/11/24 [phone]

## 2024-08-11 NOTE — Op Note (Signed)
 [facility] Gastroenterology Patient Name: Melissa Curtis Procedure Date: 08/11/2024 1:46 PM MRN: [id number] Account #: [id number] Date of Birth: [birth year] Admit Type: Inpatient Age: 83 Room: [facility] ENDO ROOM 2 Gender: Female Note Status: Finalized Instrument Name: Upper GI Scope [id number] Procedure:             Upper GI endoscopy Indications:           Esophageal dysphagia, Nausea with vomiting Providers:             Corinn Jess Brooklyn MD, MD Referring MD:          Zachary Gulling (Referring MD) Medicines:             General Anesthesia Complications:         No immediate complications. Estimated blood loss: None. Procedure:             Pre-Anesthesia Assessment:                        - Prior to the procedure, a History and Physical was                         performed, and patient medications and allergies were                         reviewed. The patient is competent. The risks and                         benefits of the procedure and the sedation options and                         risks were discussed with the patient. All questions                         were answered and informed consent was obtained.                         Patient identification and proposed procedure were                         verified by the physician, the nurse, the                         anesthesiologist, the anesthetist and the technician                         in the pre-procedure area in the procedure room in the                         endoscopy suite. Mental Status Examination: alert and                         oriented. Airway Examination: normal oropharyngeal                         airway and neck mobility. Respiratory Examination:                         clear to auscultation. CV Examination: normal.  Prophylactic Antibiotics: The patient does not require                         prophylactic antibiotics. Prior Anticoagulants: The                          patient has taken no anticoagulant or antiplatelet                         agents. ASA Grade Assessment: III - A patient with                         severe systemic disease. After reviewing the risks and                         benefits, the patient was deemed in satisfactory                         condition to undergo the procedure. The anesthesia                         plan was to use general anesthesia. Immediately prior                         to administration of medications, the patient was                         re-assessed for adequacy to receive sedatives. The                         heart rate, respiratory rate, oxygen saturations,                         blood pressure, adequacy of pulmonary ventilation, and                         response to care were monitored throughout the                         procedure. The physical status of the patient was                         re-assessed after the procedure.                        After obtaining informed consent, the endoscope was                         passed under direct vision. Throughout the procedure,                         the patient's blood pressure, pulse, and oxygen                         saturations were monitored continuously. The Endoscope                         was introduced through the mouth, and advanced to the  second part of duodenum. The upper GI endoscopy was                         accomplished without difficulty. The patient tolerated                         the procedure well. Findings:      The duodenal bulb and second portion of the duodenum were normal.      The entire examined stomach was normal. Biopsies were taken with a cold       forceps for Helicobacter pylori testing.      A small hiatal hernia was present.      LA Grade D (one or more mucosal breaks involving at least 75% of       esophageal circumference) esophagitis with no bleeding was found in the        middle, distal esophagus and gastroesophageal junction. Impression:            - Normal duodenal bulb and second portion of the                         duodenum.                        - Normal stomach. Biopsied.                        - Small hiatal hernia.                        - LA Grade D reflux esophagitis with no bleeding. Recommendation:        - Return patient to hospital ward for ongoing care.                        - Advance diet as tolerated today, avoid hard meats.                        - Continue present medications.                        - Follow an antireflux regimen for the rest of the                         patient's life.                        - Use Prilosec (omeprazole) 40 mg PO BID indefinitely.                        - Repeat upper endoscopy in 3 months to check healing. Procedure Code(s):     --- Professional ---                        [phone], Esophagogastroduodenoscopy, flexible,                         transoral; with biopsy, single or multiple Diagnosis Code(s):     --- Professional ---                        K44.9, Diaphragmatic  hernia without obstruction or                         gangrene                        K21.00, Gastro-esophageal reflux disease with                         esophagitis, without bleeding                        R13.14, Dysphagia, pharyngoesophageal phase                        R11.2, Nausea with vomiting, unspecified CPT copyright 2022 American Medical Association. All rights reserved. The codes documented in this report are preliminary and upon coder review may  be revised to meet current compliance requirements. Dr. Corinn Brooklyn Corinn Jess Brooklyn MD, MD 08/11/2024 2:06:05 PM This report has been signed electronically. Number of Addenda: 0 Note Initiated On: 08/11/2024 1:46 PM Estimated Blood Loss:  Estimated blood loss: none.      [facility]

## 2024-08-11 NOTE — Discharge Instructions (Signed)
 SABRA

## 2024-08-11 NOTE — ED Notes (Signed)
 Called pt's grandaughter 2x to come pick up pt since she was up for discharge

## 2024-08-11 NOTE — H&P (Signed)
 History and Physical    Melissa Curtis FMW:[id number] DOB: [birth year] DOA: 08/11/2024  PCP: Zachary Idelia LABOR, MD  Patient coming from: home   Chief Complaint: nausea, vomiting  HPI: Melissa Curtis is a 83 y.o. female with medical history significant for recent hospitalization for aspiration pneumonia, schazki ring last dilated in may, cll in remission, gerd, dm, htn, who presents with the above.  Has had progressive dysphagia over past 2 weeks, saw pcp and referred back to GI. Yesterday developed colicky abdominal pain, nausea, and vomiting, unable to keep anything down. No diarrhea. Abd pain resolved. Vomit is dark brown in color. Denies regular nsaids. No fevers, no headache or neck pain. No new meds. No chest pain. No cough or shortness of breath, thinks breathing is at baseline. Does feel quite weak.   Review of Systems: As per HPI otherwise 10 point review of systems negative.    Past Medical History:  Diagnosis Date   Breast cancer (HCC) 1983   had double mastectomy   Cataracts, bilateral    CLL (chronic lymphocytic leukemia) (HCC)    Diabetes (HCC)    GERD (gastroesophageal reflux disease)    Hypertension    Leukocytosis 01/2014   Osteopenia     Past Surgical History:  Procedure Laterality Date   APPENDECTOMY     AUGMENTATION MAMMAPLASTY Bilateral 1983   COLONOSCOPY     by Dr. Viktoria over 5 years ago   double mastectomy with breast rescontruction  1983   ESOPHAGOGASTRODUODENOSCOPY N/A 06/06/2021   Procedure: ESOPHAGOGASTRODUODENOSCOPY (EGD);  Surgeon: Dessa Reyes ORN, MD;  Location: [facility] ENDOSCOPY;  Service: Endoscopy;  Laterality: N/A;   ESOPHAGOGASTRODUODENOSCOPY N/A 04/19/2024   Procedure: EGD (ESOPHAGOGASTRODUODENOSCOPY);  Surgeon: Maryruth Ole DASEN, MD;  Location: [facility] ENDOSCOPY;  Service: Endoscopy;  Laterality: N/A;  DM   MASTECTOMY Bilateral 1983   right breast ca mastecomy only no chemo no rad bilat implants      reports that she quit smoking about  28 years ago. Her smoking use included cigarettes. She has never used smokeless tobacco. She reports that she does not drink alcohol and does not use drugs.  Allergies  Allergen Reactions   Statins Other (See Comments)    Could not tolerate Liptor due to aching legs and joints; However, she has been able to tolerate pravachol    Iodine    Shellfish Allergy    Sulfa Antibiotics     Family History  Problem Relation Age of Onset   Leukemia Father 8   Non-Hodgkin's lymphoma Mother 90   Colon cancer Paternal Grandmother        age unknown   Breast cancer Cousin        paternal cancer    Prior to Admission medications   Medication Sig Start Date End Date Taking? Authorizing Provider  Bacillus Coagulans-Inulin (PROBIOTIC) 1-250 BILLION-MG CAPS Take 1 capsule by mouth daily. 08/11/24  Yes Cyrena Mylar, MD  ondansetron  (ZOFRAN -ODT) 4 MG disintegrating tablet Take 1 tablet (4 mg total) by mouth every 8 (eight) hours as needed for nausea or vomiting. 08/11/24  Yes Cyrena Mylar, MD  Accu-Chek Softclix Lancets lancets as directed Use as instructed. 04/13/21   [provider]  aspirin  EC 81 MG tablet Take 1 tablet by mouth daily.    [provider]  Blood Glucose Monitoring Suppl (GLUCOCOM BLOOD GLUCOSE MONITOR) DEVI See admin instructions. 02/21/22   [provider]  Calcium Carbonate-Vitamin D 600-400 MG-UNIT tablet Take 1 tablet by mouth 2 (two) times daily.  01/15/15 03/24/38  [provider]  cyanocobalamin (VITAMIN B12) 1000 MCG tablet Take 1,000 mcg by mouth daily.    [provider]  fexofenadine (ALLEGRA) 180 MG tablet Take 180 mg by mouth daily as needed. 03/05/17 03/24/38  [provider]  fluticasone  (FLONASE ) 50 MCG/ACT nasal spray Place 1 spray into both nostrils daily. 09/07/15   [provider]  furosemide  (LASIX ) 20 MG tablet Take 1 tablet (20 mg total) by mouth daily. 07/28/24   Jerelene Critchley, MD  glucose blood test strip   11/08/14   [provider]  losartan  (COZAAR ) 25 MG tablet Take 25 mg by mouth daily. 05/11/24 05/11/25  [provider]  methylPREDNISolone (MEDROL DOSEPAK) 4 MG TBPK tablet Take 4 mg by mouth as directed. 07/16/24   [provider]  mometasone (NASONEX) 50 MCG/ACT nasal spray Place 2 sprays into the nose. 01/01/24 12/31/24  [provider]  montelukast  (SINGULAIR ) 10 MG tablet Take 10 mg by mouth daily. 05/14/24   [provider]  Multiple Vitamins-Minerals (MULTIVITAMIN GUMMIES ADULT PO) Take 1 each by mouth daily.    [provider]  omeprazole (PRILOSEC) 20 MG capsule Take 20 mg by mouth daily. Patient not taking: Reported on 07/23/2024    [provider]  pravastatin  (PRAVACHOL ) 20 MG tablet Take 20 mg by mouth daily.    [provider]  traZODone  (DESYREL ) 100 MG tablet Take 1 tablet by mouth at bedtime. 03/04/24 03/04/25  [provider]  venlafaxine  (EFFEXOR ) 75 MG tablet Take 75 mg by mouth daily.    [provider]    Physical Exam: Vitals:   08/11/24 0545 08/11/24 0546 08/11/24 0700 08/11/24 0800  BP:   (!) 171/82 (!) 176/76  Pulse:   77 73  Resp:   18 18  Temp:      TempSrc:      SpO2: (!) 88% 96% 95% 99%  Weight:      Height:        Constitutional: No acute distress Head: Atraumatic Eyes: Conjunctiva clear ENM: Moist mucous membranes.   Neck: Supple Respiratory: rales at bases otherwise clear Cardiovascular: Regular rate and rhythm. No murmurs/rubs/gallops. Abdomen: Non-tender, non-distended. No masses. No rebound or guarding. Positive bowel sounds. Musculoskeletal: No joint deformity upper and lower extremities. Normal ROM, no contractures. Normal muscle tone.  Skin: No rashes, lesions, or ulcers.  Extremities: No peripheral edema. Palpable peripheral pulses. Neurologic: Alert, moving all 4 extremities. Psychiatric: Normal insight and judgement.   Labs on Admission: I have personally  reviewed following labs and imaging studies  CBC: Recent Labs  Lab 08/10/24 2300  WBC 18.3*  HGB 12.8  HCT 39.1  MCV 99.2  PLT 242   Basic Metabolic Panel: Recent Labs  Lab 08/10/24 2300  NA 136  K 4.1  CL 103  CO2 23  GLUCOSE 200*  BUN 14  CREATININE 0.74  CALCIUM 9.1   GFR: Estimated Creatinine Clearance: 44.8 mL/min (by C-G formula based on SCr of 0.74 mg/dL). Liver Function Tests: Recent Labs  Lab 08/10/24 2300  AST 18  ALT 11  ALKPHOS 56  BILITOT 0.6  PROT 6.4*  ALBUMIN 3.6   Recent Labs  Lab 08/10/24 2300  LIPASE 27   No results for input(s): AMMONIA in the last 168 hours. Coagulation Profile: No results for input(s): INR, PROTIME in the last 168 hours. Cardiac Enzymes: No results for input(s): CKTOTAL, CKMB, CKMBINDEX, TROPONINI in the last 168 hours. BNP (last 3 results) No results  for input(s): PROBNP in the last 8760 hours. HbA1C: No results for input(s): HGBA1C in the last 72 hours. CBG: No results for input(s): GLUCAP in the last 168 hours. Lipid Profile: No results for input(s): CHOL, HDL, LDLCALC, TRIG, CHOLHDL, LDLDIRECT in the last 72 hours. Thyroid Function Tests: No results for input(s): TSH, T4TOTAL, FREET4, T3FREE, THYROIDAB in the last 72 hours. Anemia Panel: No results for input(s): VITAMINB12, FOLATE, FERRITIN, TIBC, IRON, RETICCTPCT in the last 72 hours. Urine analysis:    Component Value Date/Time   COLORURINE YELLOW (A) 08/11/2024 0229   APPEARANCEUR CLEAR (A) 08/11/2024 0229   LABSPEC 1.017 08/11/2024 0229   PHURINE 6.0 08/11/2024 0229   GLUCOSEU NEGATIVE 08/11/2024 0229   HGBUR NEGATIVE 08/11/2024 0229   BILIRUBINUR NEGATIVE 08/11/2024 0229   KETONESUR 5 (A) 08/11/2024 0229   PROTEINUR NEGATIVE 08/11/2024 0229   NITRITE NEGATIVE 08/11/2024 0229   LEUKOCYTESUR NEGATIVE 08/11/2024 0229    Radiological Exams on Admission: CT ABDOMEN PELVIS WO CONTRAST Result  Date: 08/11/2024 EXAM: CT ABDOMEN AND PELVIS WITHOUT CONTRAST 08/11/2024 01:21:34 AM TECHNIQUE: CT of the abdomen and pelvis was performed without the administration of intravenous contrast. Multiplanar reformatted images are provided for review. Automated exposure control, iterative reconstruction, and/or weight-based adjustment of the mA/kV was utilized to reduce the radiation dose to as low as reasonably achievable. COMPARISON: CT no bone marrow 10/2015 CLINICAL HISTORY: Abdominal pain, acute, nonlocalized. Pt arrives via EMS from home for c/o N/V since lunchtime today. FINDINGS: LOWER CHEST: No acute abnormality. LIVER: The liver is unremarkable. GALLBLADDER AND BILE DUCTS: Gallbladder is unremarkable. No biliary ductal dilatation. SPLEEN: No acute abnormality. PANCREAS: No acute abnormality. ADRENAL GLANDS: No acute abnormality. KIDNEYS, URETERS AND BLADDER: No stones in the kidneys or ureters. No hydronephrosis. No perinephric or periureteral stranding. Urinary bladder is unremarkable. GI AND BOWEL: Stomach demonstrates no acute abnormality. There is no bowel obstruction. PERITONEUM AND RETROPERITONEUM: No ascites. No free air. VASCULATURE: Aortic atherosclerotic calcification. LYMPH NODES: No lymphadenopathy. REPRODUCTIVE ORGANS: No acute abnormality. BONES AND SOFT TISSUES: No acute osseous abnormality. No focal soft tissue abnormality. IMPRESSION: 1. No acute findings in the abdomen or pelvis. Electronically signed by: Norman Gatlin MD 08/11/2024 01:31 AM EDT RP Workstation: [id number]     Assessment/Plan Principal Problem:   Intractable nausea and vomiting Active Problems:   Type 2 diabetes mellitus (HCC)   Benign essential HTN   Chronic lymphocytic leukemia (HCC)   Gastroesophageal reflux disease without esophagitis   Schatzki's ring of distal esophagus   # Nausea and vomiting One day of this in setting of hx schazki ring and recent dysphagia. Does not clearly sound like an obstructive  process as there is nausea and what sound like gastric contents that come up. Non-con CT of abdomen/pelvis unremarkable. Vomit described as dark brown. Labs are stable, no anemia, lipase and lfts normal. No diarrhea so gastroenteritis less likely though the nausea/vomiting can be early signs - gi consult, may need EGD - blood culture, tsh - with recent aspiration pneumonia will plan on slp consult when able to swallow - start IV ppi - npo, fluids - may need contrast-enhanced CT - stool studies if diarrhea  # HTN Bp elevated here - resume home losartan  - hold home lasix   # CLL Surveilled by oncology. Leukocytosis is chronic  # T2DM Doesn't appear to be on meds at home - ssi sensitive  # MDD - home effexor   # Insomnia - home trazodone   # Allergies - home singulair   # CAD  On imaging - holding home asa, statin   # Debility - PT consult  DVT prophylaxis: SCDs for now Code Status: dnr/dni confirmed by granddaughte r Family Communication: granddaughter telephonically  Consults called: GI   Level of care: Telemetry Medical Status is: Observation    Devaughn KATHEE Ban MD [facility] Pager [phone]  If 7PM-7AM, please contact night-coverage [email or web address] Password [email or web address]  08/11/2024, 8:38 AM

## 2024-08-11 NOTE — Anesthesia Preprocedure Evaluation (Signed)
 Anesthesia Evaluation  Patient identified by MRN, date of birth, ID band Patient awake    Reviewed: Allergy & Precautions, H&P , NPO status , Patient's Chart, lab work & pertinent test results, reviewed documented beta blocker date and time   Airway Mallampati: II   Neck ROM: full    Dental  (+) Poor Dentition   Pulmonary neg pulmonary ROS, former smoker   Pulmonary exam normal        Cardiovascular Exercise Tolerance: Good hypertension, + CAD  Normal cardiovascular exam+ Valvular Problems/Murmurs  Rhythm:regular Rate:Normal     Neuro/Psych  Headaches  negative psych ROS   GI/Hepatic Neg liver ROS,GERD  Medicated,,  Endo/Other  negative endocrine ROSdiabetes    Renal/GU      Musculoskeletal   Abdominal   Peds  Hematology negative hematology ROS (+)   Anesthesia Other Findings Past Medical History: 1983: Breast cancer (HCC)     Comment:  had double mastectomy No date: Cataracts, bilateral No date: CLL (chronic lymphocytic leukemia) (HCC) No date: Diabetes (HCC) No date: GERD (gastroesophageal reflux disease) No date: Hypertension 01/2014: Leukocytosis No date: Osteopenia Past Surgical History: No date: APPENDECTOMY 1983: AUGMENTATION MAMMAPLASTY; Bilateral No date: COLONOSCOPY     Comment:  by Dr. Viktoria over 5 years ago 1983: double mastectomy with breast rescontruction 06/06/2021: ESOPHAGOGASTRODUODENOSCOPY; N/A     Comment:  Procedure: ESOPHAGOGASTRODUODENOSCOPY (EGD);  Surgeon:               Dessa Reyes ORN, MD;  Location: [facility] ENDOSCOPY;                Service: Endoscopy;  Laterality: N/A; 1983: MASTECTOMY; Bilateral     Comment:  right breast ca mastecomy only no chemo no rad bilat               implants  BMI    Body Mass Index: 26.45 kg/m     Reproductive/Obstetrics negative OB ROS                              Anesthesia Physical Anesthesia Plan  ASA:  3  Anesthesia Plan: General   Post-op Pain Management:    Induction: Intravenous  PONV Risk Score and Plan: 3 and Propofol  infusion and TIVA  Airway Management Planned: Nasal Cannula and Natural Airway  Additional Equipment:   Intra-op Plan:   Post-operative Plan: Extubation in OR  Informed Consent: I have reviewed the patients History and Physical, chart, labs and discussed the procedure including the risks, benefits and alternatives for the proposed anesthesia with the patient or authorized representative who has indicated his/her understanding and acceptance.     Dental Advisory Given  Plan Discussed with: Anesthesiologist, CRNA and Surgeon  Anesthesia Plan Comments: (Patient consented for risks of anesthesia including but not limited to:  - adverse reactions to medications - damage to eyes, teeth, lips or other oral mucosa - nerve damage due to positioning  - sore throat or hoarseness - Damage to heart, brain, nerves, lungs, other parts of body or loss of life  Patient voiced understanding and assent.)        Anesthesia Quick Evaluation

## 2024-08-11 NOTE — Progress Notes (Signed)
 PT Cancellation Note  Patient Details Name: Melissa Curtis MRN: [id number] DOB: 1941-05-24   Cancelled Treatment:    Reason Eval/Treat Not Completed: Patient at procedure or test/unavailable. Orders received and chart reviewed. Per EMR pt off floor for endoscopy. Pt to re-attempt when pt is available and medically appropriate.    Dorina HERO. Fairly IV, PT, DPT Physical Therapist- [place]  [facility] 08/11/2024, 1:48 PM

## 2024-08-11 NOTE — Transfer of Care (Signed)
 Immediate Anesthesia Transfer of Care Note  Patient: Melissa Curtis  Procedure(s) Performed: EGD (ESOPHAGOGASTRODUODENOSCOPY)  Patient Location: PACU  Anesthesia Type:MAC  Level of Consciousness: drowsy  Airway & Oxygen Therapy: Patient Spontanous Breathing and Patient connected to nasal cannula oxygen  Post-op Assessment: Report given to RN and Post -op Vital signs reviewed and stable  Post vital signs: Reviewed and stable  Last Vitals:  Vitals Value Taken Time  BP 168/77 08/11/24 14:02  Temp 37 C 08/11/24 14:01  Pulse 72 08/11/24 14:03  Resp 24 08/11/24 14:03  SpO2 96 % 08/11/24 14:03  Vitals shown include unfiled device data.  Last Pain:  Vitals:   08/11/24 1401  TempSrc: Temporal  PainSc: Asleep         Complications: No notable events documented.

## 2024-08-11 NOTE — Consult Note (Addendum)
 Melissa JONELLE Brooklyn, MD [address]  [place], [place] [address]  Main: [phone] Fax:  [phone] Pager: [phone]   Consultation  Referring Provider:     No ref. provider found Primary Care Physician:  Zachary Idelia LABOR, MD Primary Gastroenterologist:  Dr. Maryruth         Reason for Consultation: Nausea and vomiting  Date of Admission:  08/11/2024 Date of Consultation:  08/11/2024         HPI:   Melissa Curtis is a 83 y.o. female who was recently hospitalized for aspiration pneumonia about 2 weeks ago.  He also had a history of dysphagia found to have Schatzki's ring, empirically dilated as well as 2 cm hiatal hernia.  Patient presented yesterday to the ER secondary to 2 weeks history of abdominal pain associated with nausea and vomiting and unable to keep anything down.  She also reports that the vomitus is dark brown in color.  Hemoglobin is stable and normal, leukocytosis from previous admission is trending down.  No fever, chills, denies any shortness of breath. GI is consulted due to nausea and vomiting, poor p.o. intake  NSAIDs: None  Antiplts/Anticoagulants/Anti thrombotics: None  GI Procedures:  EGD 04/19/2024 - Low- grade of narrowing Schatzki ring. Dilated to 18mm. - 2 cm hiatal hernia. - Normal stomach. - Normal examined duodenum. - No specimens collected.   Past Medical History:  Diagnosis Date   Breast cancer (HCC) 1983   had double mastectomy   Cataracts, bilateral    CLL (chronic lymphocytic leukemia) (HCC)    Diabetes (HCC)    GERD (gastroesophageal reflux disease)    Hypertension    Leukocytosis 01/2014   Osteopenia     Past Surgical History:  Procedure Laterality Date   APPENDECTOMY     AUGMENTATION MAMMAPLASTY Bilateral 1983   COLONOSCOPY     by Dr. Viktoria over 5 years ago   double mastectomy with breast rescontruction  1983   ESOPHAGOGASTRODUODENOSCOPY N/A 06/06/2021   Procedure: ESOPHAGOGASTRODUODENOSCOPY (EGD);  Surgeon: Dessa Reyes ORN, MD;  Location: [facility] ENDOSCOPY;  Service: Endoscopy;  Laterality: N/A;   ESOPHAGOGASTRODUODENOSCOPY N/A 04/19/2024   Procedure: EGD (ESOPHAGOGASTRODUODENOSCOPY);  Surgeon: Maryruth Ole DASEN, MD;  Location: [facility] ENDOSCOPY;  Service: Endoscopy;  Laterality: N/A;  DM   MASTECTOMY Bilateral 1983   right breast ca mastecomy only no chemo no rad bilat implants      Current Facility-Administered Medications:    0.9 %  sodium chloride  infusion, , Intravenous, Continuous, Wouk, Devaughn Sayres, MD, Last Rate: 125 mL/hr at 08/11/24 1346, Restarted at 08/11/24 1400   [MAR Hold] fluticasone  (FLONASE ) 50 MCG/ACT nasal spray 1 spray, 1 spray, Each Nare, Daily, Wouk, Devaughn Sayres, MD   [facility] Hold] insulin  aspart (novoLOG ) injection 0-5 Units, 0-5 Units, Subcutaneous, QHS, Wouk, Devaughn Sayres, MD   [facility] Hold] insulin  aspart (novoLOG ) injection 0-9 Units, 0-9 Units, Subcutaneous, TID WC, Wouk, Devaughn Sayres, MD, 1 Units at 08/11/24 1125   [MAR Hold] losartan  (COZAAR ) tablet 25 mg, 25 mg, Oral, Daily, Wouk, Devaughn Sayres, MD   [facility] Hold] montelukast  (SINGULAIR ) tablet 10 mg, 10 mg, Oral, Daily, Wouk, Devaughn Sayres, MD   [facility] Hold] ondansetron  (ZOFRAN ) tablet 4 mg, 4 mg, Oral, Q6H PRN **OR** [MAR Hold] ondansetron  (ZOFRAN ) injection 4 mg, 4 mg, Intravenous, Q6H PRN, Wouk, Devaughn Sayres, MD   [facility] Hold] pantoprazole  (PROTONIX ) injection 40 mg, 40 mg, Intravenous, Q12H, Wouk, Devaughn Sayres, MD, 40 mg at 08/11/24 1030   [MAR Hold] traZODone  (DESYREL ) tablet  100 mg, 100 mg, Oral, QHS, Wouk, Devaughn Sayres, MD   [facility] Hold] venlafaxine  XR (EFFEXOR -XR) 24 hr capsule 75 mg, 75 mg, Oral, Daily, Merrill, Kristin A, RPH, 75 mg at 08/11/24 1310   Family History  Problem Relation Age of Onset   Leukemia Father 64   Non-Hodgkin's lymphoma Mother 23   Colon cancer Paternal Grandmother        age unknown   Breast cancer Cousin        paternal cancer     Social History   Tobacco Use   Smoking status: Former    Current  packs/day: 0.00    Types: Cigarettes    Quit date: 12/03/1995    Years since quitting: 28.7   Smokeless tobacco: Never  Vaping Use   Vaping status: Never Used  Substance Use Topics   Alcohol use: No    Alcohol/week: 0.0 standard drinks of alcohol   Drug use: No    Allergies as of 08/10/2024 - Review Complete 08/10/2024  Allergen Reaction Noted   Statins Other (See Comments) 10/03/2015   Iodine  06/16/2014   Shellfish allergy  10/03/2015   Sulfa antibiotics  06/16/2014    Review of Systems:    All systems reviewed and negative except where noted in HPI.   Physical Exam:  Vital signs in last 24 hours: Temp:  [98.2 F (36.8 C)-98.9 F (37.2 C)] 98.6 F (37 C) (09/10 1401) Pulse Rate:  [58-87] 79 (09/10 1331) Resp:  [16-18] 16 (09/10 1331) BP: (140-176)/(63-95) 168/77 (09/10 1401) SpO2:  [88 %-100 %] 92 % (09/10 1331) Weight:  [60.3 kg] 60.3 kg (09/10 1331)   General:   Alert,  Well-developed, well-nourished, pleasant and cooperative in NAD Eyes:  Sclera clear, no icterus.   Conjunctiva pink. Lungs:  Respirations even and unlabored.  Clear throughout to auscultation.   No wheezes, crackles, or rhonchi. No acute distress. Heart:  Regular rate and rhythm; no murmurs, clicks, rubs, or gallops. Abdomen:  Normal bowel sounds. Soft, non-tender and non-distended without masses, hepatosplenomegaly or hernias noted.  No guarding or rebound tenderness.   Rectal: Not performed Extremities:  No clubbing or edema.  No cyanosis. Neurologic:  Alert and oriented x3 Skin:  Intact without significant lesions or rashes. No jaundice. Psych:  Alert and cooperative. Normal mood and affect.  LAB RESULTS:    Latest Ref Rng & Units 08/10/2024   11:00 PM 07/27/2024    6:01 AM 07/26/2024    5:30 AM  CBC  WBC 4.0 - 10.5 K/uL 18.3  34.7  28.0   Hemoglobin 12.0 - 15.0 g/dL 87.1  89.4  88.8   Hematocrit 36.0 - 46.0 % 39.1  31.9  33.9   Platelets 150 - 400 K/uL 242  142  112     BMET    Latest  Ref Rng & Units 08/10/2024   11:00 PM 07/27/2024    6:01 AM 07/26/2024    6:42 PM  BMP  Glucose 70 - 99 mg/dL 799  832    BUN 8 - 23 mg/dL 14  17    Creatinine 9.55 - 1.00 mg/dL 9.25  9.21    Sodium 864 - 145 mmol/L 136  140    Potassium 3.5 - 5.1 mmol/L 4.1  3.8  3.4   Chloride 98 - 111 mmol/L 103  102    CO2 22 - 32 mmol/L 23  32    Calcium 8.9 - 10.3 mg/dL 9.1  8.7  LFT    Latest Ref Rng & Units 08/10/2024   11:00 PM 07/23/2024   10:56 AM 04/13/2024   11:13 AM  Hepatic Function  Total Protein 6.5 - 8.1 g/dL 6.4  5.5  6.6   Albumin 3.5 - 5.0 g/dL 3.6  3.1  4.0   AST 15 - 41 U/L 18  15  19    ALT 0 - 44 U/L 11  11  12    Alk Phosphatase 38 - 126 U/L 56  53  58   Total Bilirubin 0.0 - 1.2 mg/dL 0.6  0.7  0.6      STUDIES: CT ABDOMEN PELVIS WO CONTRAST Result Date: 08/11/2024 EXAM: CT ABDOMEN AND PELVIS WITHOUT CONTRAST 08/11/2024 01:21:34 AM TECHNIQUE: CT of the abdomen and pelvis was performed without the administration of intravenous contrast. Multiplanar reformatted images are provided for review. Automated exposure control, iterative reconstruction, and/or weight-based adjustment of the mA/kV was utilized to reduce the radiation dose to as low as reasonably achievable. COMPARISON: CT no bone marrow 10/2015 CLINICAL HISTORY: Abdominal pain, acute, nonlocalized. Pt arrives via EMS from home for c/o N/V since lunchtime today. FINDINGS: LOWER CHEST: No acute abnormality. LIVER: The liver is unremarkable. GALLBLADDER AND BILE DUCTS: Gallbladder is unremarkable. No biliary ductal dilatation. SPLEEN: No acute abnormality. PANCREAS: No acute abnormality. ADRENAL GLANDS: No acute abnormality. KIDNEYS, URETERS AND BLADDER: No stones in the kidneys or ureters. No hydronephrosis. No perinephric or periureteral stranding. Urinary bladder is unremarkable. GI AND BOWEL: Stomach demonstrates no acute abnormality. There is no bowel obstruction. PERITONEUM AND RETROPERITONEUM: No ascites. No free air.  VASCULATURE: Aortic atherosclerotic calcification. LYMPH NODES: No lymphadenopathy. REPRODUCTIVE ORGANS: No acute abnormality. BONES AND SOFT TISSUES: No acute osseous abnormality. No focal soft tissue abnormality. IMPRESSION: 1. No acute findings in the abdomen or pelvis. Electronically signed by: Norman Gatlin MD 08/11/2024 01:31 AM EDT RP Workstation: [id number]      Impression / Plan:   Melissa Curtis is a 83 y.o. female with recent history of aspiration pneumonia, low-grade narrowing of Schatzki's ring, with empiric dilation, 2 cm hiatal hernia is admitted with 2 weeks history of upper abdominal pain associated with nausea and vomiting,.  CT of the abdomen without contrast was unremarkable.  Normal serum lipase, CMP Concern for erosive esophagitis given recent infection Discussed with patient regarding upper endoscopy with gastric biopsies for further evaluation Recommend long-term omeprazole 40 mg twice daily before meals Small meal at a time, eat slowly Speech pathology evaluation is pending to rule out oropharyngeal dysphagia given recent episode of aspiration pneumonia  I have discussed alternative options, risks & benefits,  which include, but are not limited to, bleeding, infection, perforation,respiratory complication & drug reaction.  The patient agrees with this plan & written consent will be obtained.     Thank you for involving me in the care of this patient.      LOS: 0 days   Melissa Brooklyn, MD  08/11/2024, 2:16 PM    Note: This dictation was prepared with Dragon dictation along with smaller phrase technology. Any transcriptional errors that result from this process are unintentional.

## 2024-08-11 NOTE — ED Notes (Signed)
 Lab called to collect blood cultures.

## 2024-08-12 ENCOUNTER — Encounter: Payer: Self-pay | Admitting: Gastroenterology

## 2024-08-12 DIAGNOSIS — K2210 Ulcer of esophagus without bleeding: Secondary | ICD-10-CM | POA: Diagnosis not present

## 2024-08-12 LAB — CBC
HCT: 34.6 % — ABNORMAL LOW (ref 36.0–46.0)
Hemoglobin: 11.5 g/dL — ABNORMAL LOW (ref 12.0–15.0)
MCH: 33.5 pg (ref 26.0–34.0)
MCHC: 33.2 g/dL (ref 30.0–36.0)
MCV: 100.9 fL — ABNORMAL HIGH (ref 80.0–100.0)
Platelets: 206 K/uL (ref 150–400)
RBC: 3.43 MIL/uL — ABNORMAL LOW (ref 3.87–5.11)
RDW: 14.2 % (ref 11.5–15.5)
WBC: 19.4 K/uL — ABNORMAL HIGH (ref 4.0–10.5)
nRBC: 0 % (ref 0.0–0.2)

## 2024-08-12 LAB — COMPREHENSIVE METABOLIC PANEL WITH GFR
ALT: 9 U/L (ref 0–44)
AST: 18 U/L (ref 15–41)
Albumin: 3.1 g/dL — ABNORMAL LOW (ref 3.5–5.0)
Alkaline Phosphatase: 40 U/L (ref 38–126)
Anion gap: 7 (ref 5–15)
BUN: 14 mg/dL (ref 8–23)
CO2: 26 mmol/L (ref 22–32)
Calcium: 8.3 mg/dL — ABNORMAL LOW (ref 8.9–10.3)
Chloride: 107 mmol/L (ref 98–111)
Creatinine, Ser: 0.68 mg/dL (ref 0.44–1.00)
GFR, Estimated: >60 mL/min (ref >60)
Glucose, Bld: 95 mg/dL (ref 70–99)
Potassium: 3.5 mmol/L (ref 3.5–5.1)
Sodium: 140 mmol/L (ref 135–145)
Total Bilirubin: 0.7 mg/dL (ref 0.0–1.2)
Total Protein: 5.5 g/dL — ABNORMAL LOW (ref 6.5–8.1)

## 2024-08-12 LAB — GLUCOSE, CAPILLARY
Glucose-Capillary: 104 mg/dL — ABNORMAL HIGH (ref 70–99)
Glucose-Capillary: 199 mg/dL — ABNORMAL HIGH (ref 70–99)

## 2024-08-12 LAB — SURGICAL PATHOLOGY

## 2024-08-12 MED ORDER — ONDANSETRON 4 MG PO TBDP: 4.0000 mg | ORAL_TABLET | Freq: Three times a day (TID) | ORAL | 0 refills | Status: AC | PRN

## 2024-08-12 MED ORDER — ACETAMINOPHEN 325 MG PO TABS
650.0000 mg | ORAL_TABLET | Freq: Four times a day (QID) | ORAL | Status: DC | PRN
  Administered 2024-08-12: 650 mg via ORAL
  Filled 2024-08-12: qty 2

## 2024-08-12 MED ORDER — SUCRALFATE 1 GM/10ML PO SUSP: 1.0000 g | Freq: Three times a day (TID) | ORAL | 0 refills | Status: DC | PRN

## 2024-08-12 MED ORDER — PANTOPRAZOLE SODIUM 40 MG PO TBEC
40.0000 mg | DELAYED_RELEASE_TABLET | Freq: Two times a day (BID) | ORAL | 1 refills | Status: AC
Start: 2024-08-12 — End: 2025-08-12

## 2024-08-12 MED ORDER — SUCRALFATE 1 G PO TABS: 1.0000 g | ORAL_TABLET | Freq: Three times a day (TID) | ORAL | 1 refills | Status: AC | PRN

## 2024-08-12 NOTE — Plan of Care (Signed)

## 2024-08-12 NOTE — Care Management Obs Status (Signed)
 MEDICARE OBSERVATION STATUS NOTIFICATION   Patient Details  Name: Melissa Curtis MRN: [id number] Date of Birth: 08-Jan-1941   Medicare Observation Status Notification Given:   PT currently working with patient    Rojelio SHAUNNA Rattler 08/12/2024, 11:16 AM

## 2024-08-12 NOTE — Evaluation (Signed)
 Physical Therapy Evaluation Patient Details Name: Melissa Curtis MRN: [id number] DOB: 10/09/1941 Today's Date: 08/12/2024  History of Present Illness  Pt is an 83 y.o. female presenting with medical history significant for recent hospitalization for aspiration pneumonia.  Pt was admitted with nausea and vomiting with MD assessment also including debility. PMH includes DM, CLL, gastric reflux, htn,  recent history of L hip bursitis per chart review, chronic dysphagia secondary to Schatzki's ring s/p dilation May 2025, multi-joint OA, anxiety/depression, breast CA s/p double mastectomy.   Clinical Impression  Pt was pleasant and motivated to participate during the session and put forth good effort throughout. Pt required min extra time and effort with bed mobility tasks and min cuing for sequencing during transfers from various height surfaces but required no physical assist.  Pt was able to amb with a RW multiple bouts of 100 ft and 50 ft with education provided on step-to sequencing for L hip pain control but with poor carryover.  Pt was generally steady during gait as well as ascending/descending steps with no overt LOB or buckling.  Pt reported no adverse symptoms during the session other than mod L hip pain with SpO2 and HR WNL throughout on room air.  Pt will benefit from continued PT services upon discharge to safely address deficits listed in patient problem list for decreased caregiver assistance and eventual return to PLOF.           If plan is discharge home, recommend the following: A little help with walking and/or transfers;A little help with bathing/dressing/bathroom;Assistance with cooking/housework;Assist for transportation;Help with stairs or ramp for entrance   Can travel by private vehicle        Equipment Recommendations None recommended by PT  Recommendations for Other Services       Functional Status Assessment Patient has had a recent decline in their functional  status and demonstrates the ability to make significant improvements in function in a reasonable and predictable amount of time.     Precautions / Restrictions Precautions Precautions: Fall Recall of Precautions/Restrictions: Intact Restrictions Weight Bearing Restrictions Per Provider Order: No      Mobility  Bed Mobility Overal bed mobility: Modified Independent             General bed mobility comments: Min extra time and effort only    Transfers Overall transfer level: Needs assistance Equipment used: Rolling walker (2 wheels) Transfers: Sit to/from Stand Sit to Stand: Supervision           General transfer comment: Good eccentric and concentric control and stability from multiple height surfaces with min verbal cues for hand placement    Ambulation/Gait Ambulation/Gait assistance: Contact guard assist Gait Distance (Feet): 100 Feet x 1, 50 Feet x 1 Assistive device: Rolling walker (2 wheels) Gait Pattern/deviations: Decreased step length - right, Decreased step length - left, Step-to pattern, Step-through pattern, Trunk flexed, Decreased stance time - left, Antalgic Gait velocity: decreased     General Gait Details: Step-to pattern education provided to address L hip pain with weight bearing with pt demonstrating poor carryover of proper technique; pt would progress quickly to step-through pattern without cuing despite increased L hip pain but was generally steady with no overt LOB or buckling noted  Stairs Stairs: Yes Stairs assistance: Contact guard assist Stair Management: Two rails, Step to pattern, Forwards Number of Stairs: 4 General stair comments: Min verbal and tactile cues for step-to sequencing to address L hip pain/weakness; steady with no overt LOB  Wheelchair Mobility     Tilt Bed    Modified Rankin (Stroke Patients Only)       Balance Overall balance assessment: Needs assistance Sitting-balance support: No upper extremity supported,  Feet supported Sitting balance-Leahy Scale: Normal     Standing balance support: During functional activity, Bilateral upper extremity supported, Reliant on assistive device for balance Standing balance-Leahy Scale: Fair                               Pertinent Vitals/Pain Pain Assessment Pain Assessment: 0-10 Pain Score: 5  Pain Location: L hip Pain Descriptors / Indicators: Sore Pain Intervention(s): Premedicated before session    Home Living Family/patient expects to be discharged to:: Private residence Living Arrangements: Other (Comment) (granddaughter) Available Help at Discharge: Family;Available 24 hours/day Type of Home: House Home Access: Stairs to enter Entrance Stairs-Rails: Right;Left;Can reach both Entrance Stairs-Number of Steps: 3   Home Layout: Two level;Able to live on main level with bedroom/bathroom Home Equipment: Rolling Walker (2 wheels);Cane - single point;BSC/3in1;Shower seat      Prior Function Prior Level of Function : Independent/Modified Independent;History of Falls (last six months)             Mobility Comments: Ind amb without an AD community distances, one fall in last 6 months when LLE buckled, currently receiving HHPT ADLs Comments: Ind with ADLs     Extremity/Trunk Assessment   Upper Extremity Assessment Upper Extremity Assessment: Generalized weakness    Lower Extremity Assessment Lower Extremity Assessment: Generalized weakness       Communication   Communication Communication: No apparent difficulties    Cognition Arousal: Alert Behavior During Therapy: WFL for tasks assessed/performed   PT - Cognitive impairments: No family/caregiver present to determine baseline                       PT - Cognition Comments: some issues with STM during the session Following commands: Intact       Cueing Cueing Techniques: Verbal cues     General Comments      Exercises     Assessment/Plan    PT  Assessment Patient needs continued PT services  PT Problem List Decreased strength;Decreased activity tolerance;Decreased balance;Decreased mobility;Pain;Decreased knowledge of use of DME       PT Treatment Interventions DME instruction;Gait training;Stair training;Functional mobility training;Therapeutic activities;Therapeutic exercise;Balance training;Patient/family education    PT Goals (Current goals can be found in the Care Plan section)  Acute Rehab PT Goals Patient Stated Goal: Improved strength and to be independent PT Goal Formulation: With patient Time For Goal Achievement: 08/25/24 Potential to Achieve Goals: Good    Frequency Min 2X/week     Co-evaluation               AM-PAC PT 6 Clicks Mobility  Outcome Measure Help needed turning from your back to your side while in a flat bed without using bedrails?: None Help needed moving from lying on your back to sitting on the side of a flat bed without using bedrails?: None Help needed moving to and from a bed to a chair (including a wheelchair)?: A Little Help needed standing up from a chair using your arms (e.g., wheelchair or bedside chair)?: A Little Help needed to walk in hospital room?: A Little Help needed climbing 3-5 steps with a railing? : A Little 6 Click Score: 20    End of Session Equipment Utilized  During Treatment: Gait belt Activity Tolerance: Patient tolerated treatment well Patient left: in chair;with call bell/phone within reach;with chair alarm set Nurse Communication: Mobility status PT Visit Diagnosis: Other abnormalities of gait and mobility (R26.89);Muscle weakness (generalized) (M62.81);Pain;Unsteadiness on feet (R26.81) Pain - Right/Left: Left Pain - part of body: Hip    Time: 8889-8860 PT Time Calculation (min) (ACUTE ONLY): 29 min   Charges:   PT Evaluation $PT Eval Moderate Complexity: 1 Mod PT Treatments $Gait Training: 8-22 mins PT General Charges $$ ACUTE PT VISIT: 1  Visit       D. Glendia Bertin PT, DPT 08/12/24, 12:13 PM

## 2024-08-12 NOTE — Discharge Summary (Signed)
 Melissa Curtis FMW:[id number] DOB: [birth year] DOA: 08/11/2024  PCP: Zachary Idelia LABOR, MD  Admit date: 08/11/2024 Discharge date: 08/12/2024  Time spent: 35 minutes  Recommendations for Outpatient Follow-up:  Pcp f/u Gi f/u 3 months     Discharge Diagnoses:  Principal Problem:   Erosive esophagitis Active Problems:   Type 2 diabetes mellitus (HCC)   Benign essential HTN   Chronic lymphocytic leukemia (HCC)   Gastroesophageal reflux disease without esophagitis   Intractable nausea and vomiting   Schatzki's ring of distal esophagus   Discharge Condition: stable  Diet recommendation: heart healthy  Filed Weights   08/10/24 2255 08/11/24 1331  Weight: 60.3 kg 60.3 kg    History of present illness:  From admission h and p Melissa Curtis is a 83 y.o. female with medical history significant for recent hospitalization for aspiration pneumonia, schazki ring last dilated in may, cll in remission, gerd, dm, htn, who presents with the above.   Has had progressive dysphagia over past 2 weeks, saw pcp and referred back to GI. Yesterday developed colicky abdominal pain, nausea, and vomiting, unable to keep anything down. No diarrhea. Abd pain resolved. Vomit is dark brown in color. Denies regular nsaids. No fevers, no headache or neck pain. No new meds. No chest pain. No cough or shortness of breath, thinks breathing is at baseline. Does feel quite weak.     Hospital Course:  Patient presents with epigastric abd pain, nausea, vomiting. EGD revealed severe erosive esophigitis, likely cause of symptoms. Patient was treated with PPI and prn sucralfate . Path shows no h pylori or malignancy. Symptoms now much improved, tolerating diet with no nausea/vomiting. Will discharge with lifetime PPI, GERD dietary instructions, and plan to f/u with GI in about 3 months for repeat upper endoscopy. Evaluated by PT and deemed stable to discharge to resume home health. D/c plan reviewed with granddaughter  who is in agreement.   Procedures: EGD   Consultations: GI  Discharge Exam: Vitals:   08/12/24 0325 08/12/24 0854  BP: 120/63 (!) 143/63  Pulse: 69 73  Resp: 16 16  Temp: 98.3 F (36.8 C) 98 F (36.7 C)  SpO2: 92% 93%    General: NAD Cardiovascular: RRR Respiratory: CTAB Abdomen: soft, non-tender  Discharge Instructions   Discharge Instructions     Diet - low sodium heart healthy   Complete by: As directed    Increase activity slowly   Complete by: As directed       Allergies as of 08/12/2024       Reactions   Statins Other (See Comments)   Could not tolerate Liptor due to aching legs and joints; However, she has been able to tolerate pravachol    Iodine    Shellfish Allergy    Sulfa Antibiotics         Medication List     STOP taking these medications    methylPREDNISolone 4 MG Tbpk tablet Commonly known as: MEDROL DOSEPAK   omeprazole 20 MG capsule Commonly known as: PRILOSEC       TAKE these medications    Accu-Chek Softclix Lancets lancets as directed Use as instructed.   albuterol  108 (90 Base) MCG/ACT inhaler Commonly known as: VENTOLIN  HFA Inhale 2 puffs into the lungs every 4 (four) hours as needed.   aspirin  EC 81 MG tablet Take 1 tablet by mouth daily.   Calcium Carbonate-Vitamin D 600-400 MG-UNIT tablet Take 1 tablet by mouth 2 (two) times daily.   cyanocobalamin 1000 MCG tablet Commonly  known as: VITAMIN B12 Take 1,000 mcg by mouth daily.   EasiVent inhaler See admin instructions.   fexofenadine 180 MG tablet Commonly known as: ALLEGRA Take 180 mg by mouth daily as needed.   fluticasone  50 MCG/ACT nasal spray Commonly known as: FLONASE  Place 1 spray into both nostrils daily.   furosemide  20 MG tablet Commonly known as: LASIX  Take 1 tablet (20 mg total) by mouth daily.   GlucoCom Blood Glucose Monitor Devi See admin instructions.   glucose blood test strip   losartan  25 MG tablet Commonly known as:  COZAAR  Take 25 mg by mouth daily.   mometasone 50 MCG/ACT nasal spray Commonly known as: NASONEX Place 2 sprays into the nose.   montelukast  10 MG tablet Commonly known as: SINGULAIR  Take 10 mg by mouth daily.   MULTIVITAMIN GUMMIES ADULT PO Take 1 each by mouth daily.   ondansetron  4 MG disintegrating tablet Commonly known as: ZOFRAN -ODT Take 1 tablet (4 mg total) by mouth every 8 (eight) hours as needed for nausea or vomiting.   pantoprazole  40 MG tablet Commonly known as: Protonix  Take 1 tablet (40 mg total) by mouth 2 (two) times daily before a meal.   pravastatin  20 MG tablet Commonly known as: PRAVACHOL  Take 20 mg by mouth daily.   Probiotic 1-250 BILLION-MG Caps Take 1 capsule by mouth daily.   sucralfate  1 g tablet Commonly known as: Carafate  Take 1 tablet (1 g total) by mouth every 8 (eight) hours as needed.   traZODone  100 MG tablet Commonly known as: DESYREL  Take 1 tablet by mouth at bedtime.   venlafaxine  XR 75 MG 24 hr capsule Commonly known as: EFFEXOR -XR Take 75 mg by mouth daily with breakfast.       Allergies  Allergen Reactions   Statins Other (See Comments)    Could not tolerate Liptor due to aching legs and joints; However, she has been able to tolerate pravachol    Iodine    Shellfish Allergy    Sulfa Antibiotics     Follow-up Information     George, Sionne A, MD. Schedule an appointment as soon as possible for a visit .   Specialty: Family Medicine Contact information: [address] [place] [address] [phone]         Unk Corinn Skiff, MD Follow up.   Specialty: Gastroenterology Why: 3 months Contact information: [address] [place] [address] [phone]                  The results of significant diagnostics from this hospitalization (including imaging, microbiology, ancillary and laboratory) are listed below for reference.    Significant Diagnostic Studies: CT ABDOMEN PELVIS WO  CONTRAST Result Date: 08/11/2024 EXAM: CT ABDOMEN AND PELVIS WITHOUT CONTRAST 08/11/2024 01:21:34 AM TECHNIQUE: CT of the abdomen and pelvis was performed without the administration of intravenous contrast. Multiplanar reformatted images are provided for review. Automated exposure control, iterative reconstruction, and/or weight-based adjustment of the mA/kV was utilized to reduce the radiation dose to as low as reasonably achievable. COMPARISON: CT no bone marrow 10/2015 CLINICAL HISTORY: Abdominal pain, acute, nonlocalized. Pt arrives via EMS from home for c/o N/V since lunchtime today. FINDINGS: LOWER CHEST: No acute abnormality. LIVER: The liver is unremarkable. GALLBLADDER AND BILE DUCTS: Gallbladder is unremarkable. No biliary ductal dilatation. SPLEEN: No acute abnormality. PANCREAS: No acute abnormality. ADRENAL GLANDS: No acute abnormality. KIDNEYS, URETERS AND BLADDER: No stones in the kidneys or ureters. No hydronephrosis. No perinephric or periureteral stranding. Urinary  bladder is unremarkable. GI AND BOWEL: Stomach demonstrates no acute abnormality. There is no bowel obstruction. PERITONEUM AND RETROPERITONEUM: No ascites. No free air. VASCULATURE: Aortic atherosclerotic calcification. LYMPH NODES: No lymphadenopathy. REPRODUCTIVE ORGANS: No acute abnormality. BONES AND SOFT TISSUES: No acute osseous abnormality. No focal soft tissue abnormality. IMPRESSION: 1. No acute findings in the abdomen or pelvis. Electronically signed by: Norman Gatlin MD 08/11/2024 01:31 AM EDT RP Workstation: [id number]   ECHOCARDIOGRAM COMPLETE Result Date: 07/26/2024    ECHOCARDIOGRAM REPORT   Patient Name:   DOVE GRESHAM Date of Exam: 07/26/2024 Medical Rec #:  [id number]        Height:       62.0 in Accession #:    [id number]       Weight:       137.8 lb Date of Birth:  Oct 19, 1941        BSA:          1.632 m Patient Age:    83 years         BP:           150/91 mmHg Patient Gender: F                HR:            88 bpm. Exam Location:  [facility] Procedure: 2D Echo, Cardiac Doppler, Color Doppler and Intracardiac            Opacification Agent (Both Spectral and Color Flow Doppler were            utilized during procedure). Indications:     Congestive Heart Failure I50.9  History:         Patient has no prior history of Echocardiogram examinations.  Sonographer:     Ashley McNeely-Sloane Referring Phys:  [id number] [facility] Diagnosing Phys: Keller Paterson  Sonographer Comments: Image acquisition challenging due to breast implants. IMPRESSIONS  1. Left ventricular ejection fraction, by estimation, is 55 to 60%. The left ventricle has normal function. The left ventricle has no regional wall motion abnormalities. There is mild left ventricular hypertrophy. Left ventricular diastolic parameters are consistent with Grade II diastolic dysfunction (pseudonormalization).  2. Right ventricular systolic function is normal. The right ventricular size is normal.  3. The mitral valve is normal in structure. Trivial mitral valve regurgitation. Moderate mitral annular calcification.  4. The aortic valve is tricuspid. There is mild thickening of the aortic valve. Aortic valve regurgitation is mild. Aortic valve sclerosis/calcification is present, without any evidence of aortic stenosis. FINDINGS  Left Ventricle: Left ventricular ejection fraction, by estimation, is 55 to 60%. The left ventricle has normal function. The left ventricle has no regional wall motion abnormalities. Definity  contrast agent was given IV to delineate the left ventricular  endocardial borders. The left ventricular internal cavity size was normal in size. There is mild left ventricular hypertrophy. Left ventricular diastolic parameters are consistent with Grade II diastolic dysfunction (pseudonormalization). Right Ventricle: The right ventricular size is normal. No increase in right ventricular wall thickness. Right ventricular systolic function is normal. Left Atrium:  Left atrial size was normal in size. Right Atrium: Right atrial size was normal in size. Pericardium: There is no evidence of pericardial effusion. Mitral Valve: The mitral valve is normal in structure. Moderate mitral annular calcification. Trivial mitral valve regurgitation. MV peak gradient, 9.9 mmHg. The mean mitral valve gradient is 4.0 mmHg. Tricuspid Valve: The tricuspid valve is not well visualized. Tricuspid valve regurgitation is trivial. Aortic Valve: The  aortic valve is tricuspid. There is mild thickening of the aortic valve. Aortic valve regurgitation is mild. Aortic valve sclerosis/calcification is present, without any evidence of aortic stenosis. Aortic valve mean gradient measures 5.0 mmHg. Aortic valve peak gradient measures 8.5 mmHg. Aortic valve area, by VTI measures 3.44 cm. Pulmonic Valve: The pulmonic valve was not well visualized. Pulmonic valve regurgitation is not visualized. Aorta: The aortic root and ascending aorta are structurally normal, with no evidence of dilitation. IAS/Shunts: The atrial septum is grossly normal.  LEFT VENTRICLE PLAX 2D LVIDd:         2.90 cm     Diastology LVIDs:         2.00 cm     LV e' medial:    4.68 cm/s LV PW:         1.80 cm     LV E/e' medial:  22.9 LV IVS:        1.30 cm     LV e' lateral:   6.64 cm/s LVOT diam:     2.40 cm     LV E/e' lateral: 16.1 LV SV:         111 LV SV Index:   68 LVOT Area:     4.52 cm  LV Volumes (MOD) LV vol d, MOD A2C: 37.5 ml LV vol d, MOD A4C: 76.5 ml LV vol s, MOD A2C: 15.0 ml LV vol s, MOD A4C: 25.7 ml LV SV MOD A2C:     22.5 ml LV SV MOD A4C:     76.5 ml LV SV MOD BP:      30.5 ml RIGHT VENTRICLE RV Basal diam:  3.40 cm RV Mid diam:    3.10 cm RV S prime:     13.60 cm/s TAPSE (M-mode): 2.3 cm LEFT ATRIUM           Index        RIGHT ATRIUM           Index LA diam:      3.10 cm 1.90 cm/m   RA Area:     10.40 cm LA Vol (A4C): 27.7 ml 16.98 ml/m  RA Volume:   19.80 ml  12.13 ml/m  AORTIC VALVE                     PULMONIC  VALVE AV Area (Vmax):    3.78 cm      PV Vmax:        1.13 m/s AV Area (Vmean):   3.53 cm      PV Vmean:       79.100 cm/s AV Area (VTI):     3.44 cm      PV VTI:         0.213 m AV Vmax:           146.00 cm/s   PV Peak grad:   5.1 mmHg AV Vmean:          102.000 cm/s  PV Mean grad:   3.0 mmHg AV VTI:            0.322 m       RVOT Peak grad: 3 mmHg AV Peak Grad:      8.5 mmHg AV Mean Grad:      5.0 mmHg LVOT Vmax:         122.00 cm/s LVOT Vmean:        79.700 cm/s LVOT VTI:  0.245 m LVOT/AV VTI ratio: 0.76  AORTA Ao Root diam: 3.10 cm Ao Asc diam:  3.50 cm MITRAL VALVE MV Area (PHT): 3.37 cm     SHUNTS MV Area VTI:   3.36 cm     Systemic VTI:  0.24 m MV Peak grad:  9.9 mmHg     Systemic Diam: 2.40 cm MV Mean grad:  4.0 mmHg     Pulmonic VTI:  0.150 m MV Vmax:       1.57 m/s MV Vmean:      92.5 cm/s MV Decel Time: 225 msec MV E velocity: 107.00 cm/s MV A velocity: 155.00 cm/s MV E/A ratio:  0.69 Keller Paterson Electronically signed by Keller Paterson Signature Date/Time: 07/26/2024/6:07:10 PM    Final    CT Angio Chest Pulmonary Embolism (PE) W or WO Contrast Result Date: 07/26/2024 CLINICAL DATA:  Hypoxia, elevated D-dimer. EXAM: CT ANGIOGRAPHY CHEST WITH CONTRAST TECHNIQUE: Multidetector CT imaging of the chest was performed using the standard protocol during bolus administration of intravenous contrast. Multiplanar CT image reconstructions and MIPs were obtained to evaluate the vascular anatomy. RADIATION DOSE REDUCTION: This exam was performed according to the departmental dose-optimization program which includes automated exposure control, adjustment of the mA and/or kV according to patient size and/or use of iterative reconstruction technique. CONTRAST:  75mL OMNIPAQUE  IOHEXOL  350 MG/ML SOLN COMPARISON:  None Available. FINDINGS: Cardiovascular: Negative for pulmonary embolus. Atherosclerotic calcification of the aorta, aortic valve and coronary arteries. Enlarged pulmonic trunk and heart. No  pericardial effusion. Mediastinum/Nodes: 1.4 cm low-attenuation left thyroid nodule. No follow-up recommended. (Ref: J Am Coll Radiol. 2015 Feb;12(2): 143-50).Thoracic inlet lymph nodes are not enlarged by CT size criteria. No pathologically enlarged mediastinal, hilar or axillary lymph nodes. Esophagus is grossly unremarkable. Lungs/Pleura: Image quality is degraded by expiratory phase imaging and respiratory motion. Mild bibasilar atelectasis. Mild septal thickening. Trace bilateral pleural effusions. Airway is otherwise unremarkable. Upper Abdomen: Visualized portions of the liver, gallbladder, right adrenal gland, spleen and stomach are grossly unremarkable with the exception of a small hiatal hernia. Musculoskeletal: Degenerative changes in the spine. Review of the MIP images confirms the above findings. IMPRESSION: 1. Negative for pulmonary embolus. 2. Mild congestive heart failure. 3. Aortic atherosclerosis (ICD10-I70.0). Coronary artery calcification. 4. Enlarged pulmonic trunk, indicative of pulmonary arterial hypertension. Electronically Signed   By: Newell Eke M.D.   On: 07/26/2024 11:34   DG Abd 2 Views Result Date: 07/23/2024 CLINICAL DATA:  Nausea and vomiting. EXAM: ABDOMEN - 2 VIEW COMPARISON:  CT abdomen and pelvis 10/13/2015 FINDINGS: Scattered gas and stool in the colon. No small or large bowel distention. No radiopaque stones. Degenerative changes in the spine and hips. Old fracture deformities of the right hemipelvis. Suggestion of infiltration or atelectasis in the lung bases. Soft tissue contours appear intact. IMPRESSION: Normal nonobstructive bowel gas pattern. Electronically Signed   By: Elsie Gravely M.D.   On: 07/23/2024 16:18   DG Chest Portable 1 View Result Date: 07/23/2024 CLINICAL DATA:  Dizziness.  Syncope. EXAM: PORTABLE CHEST 1 VIEW COMPARISON:  March 17, 2018. FINDINGS: The heart size and mediastinal contours are within normal limits. Both lungs are clear. The  visualized skeletal structures are unremarkable. IMPRESSION: No active disease. Electronically Signed   By: Lynwood Landy Raddle M.D.   On: 07/23/2024 15:45   CT HEAD WO CONTRAST ( ) Result Date: 07/23/2024 CLINICAL DATA:  Vertigo, central. Vomiting. Syncopal episode. Dizziness. EXAM: CT HEAD WITHOUT CONTRAST TECHNIQUE: Contiguous axial images were obtained from the  base of the skull through the vertex without intravenous contrast. RADIATION DOSE REDUCTION: This exam was performed according to the departmental dose-optimization program which includes automated exposure control, adjustment of the mA and/or kV according to patient size and/or use of iterative reconstruction technique. COMPARISON:  MRI 08/05/2023 FINDINGS: Brain: Age related atrophy. Advanced chronic small-vessel ischemic changes affecting the pons and cerebral hemispheric white matter. No large vessel territory stroke. No intra-axial mass lesion. 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 Slight indentation of the lateral aspect of the right hemisphere but without evidence of mass effect or brain edema. Chronic ventriculomegaly probably due to central atrophy, not significantly changed. No extra-axial fluid collection. Vascular: There is atherosclerotic calcification of the major vessels at the base of the brain. Skull: Negative Sinuses/Orbits: Clear/normal Other: None IMPRESSION: 1. No acute CT finding. Age related atrophy. Advanced chronic small-vessel ischemic changes of the pons and cerebral hemispheric white matter. 2. 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 Slight indentation of the lateral aspect of the right hemisphere but without evidence of mass effect or brain edema. 3. Chronic ventriculomegaly probably due to central atrophy, not significantly  changed. Electronically Signed   By: Oneil Officer M.D.   On: 07/23/2024 12:39    Microbiology: Recent Results (from the past 240 hours)  Culture, blood (Routine X 2) w Reflex to ID Panel     Status: None (Preliminary result)   Collection Time: 08/11/24  9:45 AM   Specimen: BLOOD LEFT ARM  Result Value Ref Range Status   Specimen Description BLOOD LEFT ARM  Final   Special Requests   Final    BOTTLES DRAWN AEROBIC ONLY Blood Culture adequate volume   Culture   Final    NO GROWTH < 24 HOURS Performed at [facility], [address], [place], [place] [address]    Report Status PENDING  Incomplete  Culture, blood (Routine X 2) w Reflex to ID Panel     Status: None (Preliminary result)   Collection Time: 08/11/24  9:45 AM   Specimen: BLOOD LEFT HAND  Result Value Ref Range Status   Specimen Description BLOOD LEFT HAND  Final   Special Requests   Final    BOTTLES DRAWN AEROBIC AND ANAEROBIC Blood Culture adequate volume   Culture   Final    NO GROWTH < 24 HOURS Performed at [facility], [address], [place], [place] [address]    Report Status PENDING  Incomplete  Resp panel by RT-PCR (RSV, Flu A&B, Covid) Anterior Nasal Swab     Status: None   Collection Time: 08/11/24  5:00 PM   Specimen: Anterior Nasal Swab  Result Value Ref Range Status   SARS Coronavirus 2 by RT PCR NEGATIVE NEGATIVE Final    Comment: (NOTE) SARS-CoV-2 target nucleic acids are NOT DETECTED.  The SARS-CoV-2 RNA is generally detectable in upper respiratory specimens during the acute phase of infection. The lowest concentration of SARS-CoV-2 viral copies this assay can detect is 138 copies/mL. A negative result does not preclude SARS-Cov-2 infection and should not be used as the sole basis for treatment or other patient management decisions. A negative result may occur with  improper specimen collection/handling, submission of specimen other than nasopharyngeal swab, presence of viral  mutation(s) within the areas targeted by this assay,  and inadequate number of viral copies(<138 copies/mL). A negative result must be combined with clinical observations, patient history, and epidemiological information. The expected result is Negative.  Fact Sheet for Patients:  BloggerCourse.com  Fact Sheet for Healthcare Providers:  SeriousBroker.it  This test is no t yet approved or cleared by the [place]  FDA and  has been authorized for detection and/or diagnosis of SARS-CoV-2 by FDA under an Emergency Use Authorization (EUA). This EUA will remain  in effect (meaning this test can be used) for the duration of the COVID-19 declaration under Section 564(b)(1) of the Act, 21 U.S.C.section 360bbb-3(b)(1), unless the authorization is terminated  or revoked sooner.       Influenza A by PCR NEGATIVE NEGATIVE Final   Influenza B by PCR NEGATIVE NEGATIVE Final    Comment: (NOTE) The Xpert Xpress SARS-CoV-2/FLU/RSV plus assay is intended as an aid in the diagnosis of influenza from Nasopharyngeal swab specimens and should not be used as a sole basis for treatment. Nasal washings and aspirates are unacceptable for Xpert Xpress SARS-CoV-2/FLU/RSV testing.  Fact Sheet for Patients: BloggerCourse.com  Fact Sheet for Healthcare Providers: SeriousBroker.it  This test is not yet approved or cleared by the [place]  FDA and has been authorized for detection and/or diagnosis of SARS-CoV-2 by FDA under an Emergency Use Authorization (EUA). This EUA will remain in effect (meaning this test can be used) for the duration of the COVID-19 declaration under Section 564(b)(1) of the Act, 21 U.S.C. section 360bbb-3(b)(1), unless the authorization is terminated or revoked.     Resp Syncytial Virus by PCR NEGATIVE NEGATIVE Final    Comment: (NOTE) Fact Sheet for  Patients: BloggerCourse.com  Fact Sheet for Healthcare Providers: SeriousBroker.it  This test is not yet approved or cleared by the [place]  FDA and has been authorized for detection and/or diagnosis of SARS-CoV-2 by FDA under an Emergency Use Authorization (EUA). This EUA will remain in effect (meaning this test can be used) for the duration of the COVID-19 declaration under Section 564(b)(1) of the Act, 21 U.S.C. section 360bbb-3(b)(1), unless the authorization is terminated or revoked.  Performed at [facility], [address] [place], [place] [address]      Labs: Basic Metabolic Panel: Recent Labs  Lab 08/10/24 2300 08/12/24 0346  NA 136 140  K 4.1 3.5  CL 103 107  CO2 23 26  GLUCOSE 200* 95  BUN 14 14  CREATININE 0.74 0.68  CALCIUM 9.1 8.3*   Liver Function Tests: Recent Labs  Lab 08/10/24 2300 08/12/24 0346  AST 18 18  ALT 11 9  ALKPHOS 56 40  BILITOT 0.6 0.7  PROT 6.4* 5.5*  ALBUMIN 3.6 3.1*   Recent Labs  Lab 08/10/24 2300  LIPASE 27   No results for input(s): AMMONIA in the last 168 hours. CBC: Recent Labs  Lab 08/10/24 2300 08/12/24 0346  WBC 18.3* 19.4*  HGB 12.8 11.5*  HCT 39.1 34.6*  MCV 99.2 100.9*  PLT 242 206   Cardiac Enzymes: No results for input(s): CKTOTAL, CKMB, CKMBINDEX, TROPONINI in the last 168 hours. BNP: BNP (last 3 results) Recent Labs    07/24/24 0426 07/26/24 0530  BNP 146.5* 300.4*    ProBNP (last 3 results) No results for input(s): PROBNP in the last 8760 hours.  CBG: Recent Labs  Lab 08/11/24 1116 08/11/24 1714 08/11/24 2049 08/12/24 0746 08/12/24 1213  GLUCAP 135* 91 171* 104* 199*       Signed:  Devaughn KATHEE Ban MD.  [facility] 08/12/2024, 12:39 PM

## 2024-08-12 NOTE — TOC Transition Note (Signed)
 Transition of Care [facility]) - Discharge Note   Patient Details  Name: Melissa Curtis MRN: [id number] Date of Birth: [birth year]  Transition of [facility]) CM/SW Contact:  Alfonso Rummer, LCSW Phone Number: 08/12/2024, 1:47 PM   Clinical Narrative:     LCSW A. Hamlet Lasecki Psychologist, occupational via phone. KYM Ee with [facility] reports [name] is active with PT/OT and nursing services 3x a week in the patients home. Ms. Ee reports Donn will contact pt tomorrow to resume home health services.         Patient Goals and CMS Choice            Discharge Placement               Home with Home Health         Discharge Plan and Services Additional resources added to the After Visit Summary for                                       Social Drivers of Health (SDOH) Interventions SDOH Screenings   Food Insecurity: No Food Insecurity (08/11/2024)  Housing: Low Risk  (08/11/2024)  Transportation Needs: No Transportation Needs (08/11/2024)  Utilities: Not At Risk (08/11/2024)  Financial Resource Strain: Low Risk  (06/11/2024)   Received from [facility]  Social Connections: Unknown (08/11/2024)  Tobacco Use: Medium Risk (08/10/2024)     Readmission Risk Interventions     No data to display

## 2024-08-12 NOTE — Care Management Obs Status (Signed)
 MEDICARE OBSERVATION STATUS NOTIFICATION   Patient Details  Name: Melissa Curtis MRN: [id number] Date of Birth: [birth year]   Medicare Observation Status Notification Given:  Yes    Rojelio SHAUNNA Rattler 08/12/2024, 11:56 AM

## 2024-08-16 LAB — CULTURE, BLOOD (ROUTINE X 2)
Culture: NO GROWTH
Culture: NO GROWTH
Special Requests: ADEQUATE
Special Requests: ADEQUATE

## 2024-08-18 ENCOUNTER — Ambulatory Visit: Payer: Self-pay | Admitting: Gastroenterology

## 2024-09-11 ENCOUNTER — Emergency Department

## 2024-09-11 ENCOUNTER — Other Ambulatory Visit: Payer: Self-pay

## 2024-09-11 ENCOUNTER — Emergency Department
Admission: EM | Admit: 2024-09-11 | Discharge: 2024-09-11 | Disposition: A | Discharge status: Home / Self Care | Attending: Emergency Medicine | Admitting: Emergency Medicine

## 2024-09-11 DIAGNOSIS — S8002XA Contusion of left knee, initial encounter: Secondary | ICD-10-CM | POA: Insufficient documentation

## 2024-09-11 DIAGNOSIS — M1712 Unilateral primary osteoarthritis, left knee: Secondary | ICD-10-CM | POA: Insufficient documentation

## 2024-09-11 DIAGNOSIS — S7002XA Contusion of left hip, initial encounter: Secondary | ICD-10-CM | POA: Insufficient documentation

## 2024-09-11 DIAGNOSIS — W19XXXA Unspecified fall, initial encounter: Secondary | ICD-10-CM | POA: Insufficient documentation

## 2024-09-11 DIAGNOSIS — E119 Type 2 diabetes mellitus without complications: Secondary | ICD-10-CM | POA: Diagnosis not present

## 2024-09-11 DIAGNOSIS — Z856 Personal history of leukemia: Secondary | ICD-10-CM | POA: Insufficient documentation

## 2024-09-11 DIAGNOSIS — I10 Essential (primary) hypertension: Secondary | ICD-10-CM | POA: Diagnosis not present

## 2024-09-11 DIAGNOSIS — S79912A Unspecified injury of left hip, initial encounter: Secondary | ICD-10-CM | POA: Diagnosis present

## 2024-09-11 MED ORDER — LIDOCAINE 5 % EX PTCH
1.0000 | MEDICATED_PATCH | CUTANEOUS | Status: DC
  Administered 2024-09-11: 1 via TRANSDERMAL
  Filled 2024-09-11: qty 1

## 2024-09-11 MED ORDER — LIDOCAINE 5 % EX PTCH: 1.0000 | MEDICATED_PATCH | Freq: Two times a day (BID) | CUTANEOUS | 0 refills | Status: AC

## 2024-09-11 NOTE — ED Provider Notes (Signed)
 [facility] Provider Note    Event Date/Time   First MD Initiated Contact with Patient 09/11/24 1310     (approximate)   History   Fall (/)   HPI  Melissa Curtis is a 83 y.o. female who presents today for evaluation of left hip and knee pain after a fall that occurred 2 days ago.  Patient reports that she lost her balance and slid to the ground.  She denies head strike or LOC.  She was able to get up with assistance and has been ambulatory over the past 2 days but had worsening pain today prompting her to come in for evaluation.  She is with her grandson with whom she lives.  No vomiting.  No neck pain or back pain.  She is still able to walk with her walker but has pain with doing so.  Patient Active Problem List   Diagnosis Date Noted   Erosive esophagitis 08/12/2024   Intractable nausea and vomiting 08/11/2024   Schatzki's ring of distal esophagus 08/11/2024   Aspiration pneumonia (HCC) 07/23/2024   Sepsis (HCC) 07/23/2024   Allergic rhinitis 01/07/2024   Ascending aorta dilatation 03/24/2023   Valvular regurgitation 03/24/2023   Headache 06/03/2019   Aortic atherosclerosis 03/23/2018   Dizziness 09/25/2017   Need for vaccination 06/05/2017   Gastroesophageal reflux disease without esophagitis 03/03/2017   Hypokalemia 09/17/2016   Tubular adenoma of colon 10/20/2015   Combined fat and carbohydrate induced hyperlipemia 10/03/2015   Beat, premature ventricular 10/03/2015   Arteriosclerosis of coronary artery 10/03/2015   Chronic lymphocytic leukemia (HCC) 10/03/2015   Benign essential HTN 08/29/2015   Osteopenia 01/15/2015   Back ache 11/09/2014   Cardiac murmur 03/17/2014   Adaptive colitis 02/22/2014   Type 2 diabetes mellitus (HCC) 09/30/2013          Physical Exam   Triage Vital Signs: ED Triage Vitals  Encounter Vitals Group     BP 09/11/24 1300 (!) 139/90     Girls Systolic BP Percentile --      Girls Diastolic BP Percentile  --      Boys Systolic BP Percentile --      Boys Diastolic BP Percentile --      Pulse Rate 09/11/24 1300 (!) 59     Resp 09/11/24 1300 18     Temp 09/11/24 1300 98.1 F (36.7 C)     Temp Source 09/11/24 1300 Oral     SpO2 09/11/24 1300 99 %     Weight --      Height --      Head Circumference --      Peak Flow --      Pain Score 09/11/24 1301 7     Pain Loc --      Pain Education --      Exclude from Growth Chart --     Most recent vital signs: Vitals:   09/11/24 1300  BP: (!) 139/90  Pulse: (!) 59  Resp: 18  Temp: 98.1 F (36.7 C)  SpO2: 99%    Physical Exam Vitals and nursing note reviewed.  Constitutional:      General: Awake and alert. No acute distress.    Appearance: Normal appearance. The patient is normal weight.  HENT:     Head: Normocephalic and atraumatic.     Mouth: Mucous membranes are moist.  Eyes:     General: PERRL. Normal EOMs        Right eye: No discharge.  Left eye: No discharge.     Conjunctiva/sclera: Conjunctivae normal.  Cardiovascular:     Rate and Rhythm: Normal rate and regular rhythm.     Pulses: Normal pulses.  Pulmonary:     Effort: Pulmonary effort is normal. No respiratory distress.     Breath sounds: Normal breath sounds.  Abdominal:     Abdomen is soft. There is no abdominal tenderness. No rebound or guarding. No distention. Musculoskeletal:        General: No swelling. Normal range of motion.     Cervical back: Normal range of motion and neck supple.  Pelvis stable.  Negative logroll bilaterally.  Able to flex bilateral hips, though pain with flexion of the left hip.  Normal internal and external rotation against resistance.  Able to extend left knee.  No paresthesias.  No effusion noted.  No erythema or open wounds noted. Skin:    General: Skin is warm and dry.     Capillary Refill: Capillary refill takes less than 2 seconds.     Findings: No rash.  Neurological:     Mental Status: The patient is awake and alert.       ED Results / Procedures / Treatments   Labs (all labs ordered are listed, but only abnormal results are displayed) Labs Reviewed - No data to display   EKG     RADIOLOGY I independently reviewed and interpreted imaging and agree with radiologists findings.     PROCEDURES:  Critical Care performed:   Procedures   MEDICATIONS ORDERED IN ED: Medications  lidocaine  (LIDODERM ) 5 % 1 patch (1 patch Transdermal Patch Applied 09/11/24 1412)     IMPRESSION / MDM / ASSESSMENT AND PLAN / ED COURSE  I reviewed the triage vital signs and the nursing notes.   Differential diagnosis includes, but is not limited to, contusion, fracture, sprain.  Patient is awake and alert, hemodynamically stable and afebrile.  She is neurovascularly intact.  There are no open wounds, erythema, or evidence of infection.  She has intact range of motion with active and passive range of motion of her knee and hip, though pain with active flexion of her hip.  She had just taken Tylenol , therefore she was given Lidoderm  patch with good effect.  X-rays obtained are negative for any acute osseous injury.  Patient and grandson are reassured by these findings.  We discussed return precautions and the importance of close outpatient follow-up.  Patient understands and agrees with plan.  Discharged in stable condition.    Patient's presentation is most consistent with acute complicated illness / injury requiring diagnostic workup.       FINAL CLINICAL IMPRESSION(S) / ED DIAGNOSES   Final diagnoses:  Fall, initial encounter  Contusion of left hip, initial encounter  Contusion of left knee, initial encounter  Primary osteoarthritis of left knee     Rx / DC Orders   ED Discharge Orders          Ordered    lidocaine  (LIDODERM ) 5 %  Every 12 hours        09/11/24 1424             Note:  This document was prepared using Dragon voice recognition software and may include unintentional  dictation errors.   Solei Wubben E, PA-C 09/11/24 1446    Levander Slate, MD 09/11/24 1501

## 2024-09-11 NOTE — ED Triage Notes (Addendum)
 Pt to ED via POV from home. Pt reports was walking with her walker and turned around and caught the counter and fell on left hip and left knee. Pt reports left hip pain has gotten worse. Pt reports has still been able to walk with walker. No LOC. No head trauma. No blood thinner.

## 2024-09-11 NOTE — Discharge Instructions (Signed)
 You may follow-up with orthopedics if your symptoms persist.  You may also use the Lidoderm  patches to help with your symptoms.  In the meantime you may also take Tylenol  per package instructions to help with your pain.  Please return for any new, worsening, or changing symptoms or other concerns.  It was a pleasure caring for you today.

## 2024-09-23 DIAGNOSIS — G301 Alzheimer's disease with late onset: Secondary | ICD-10-CM | POA: Diagnosis present

## 2024-09-23 DIAGNOSIS — E854 Organ-limited amyloidosis: Secondary | ICD-10-CM | POA: Diagnosis present

## 2024-11-10 ENCOUNTER — Ambulatory Visit

## 2024-11-10 ENCOUNTER — Ambulatory Visit
Admission: RE | Admit: 2024-11-10 | Discharge: 2024-11-10 | Disposition: A | Attending: Gastroenterology | Admitting: Gastroenterology

## 2024-11-10 ENCOUNTER — Encounter: Payer: Self-pay | Admitting: Gastroenterology

## 2024-11-10 ENCOUNTER — Other Ambulatory Visit: Payer: Self-pay

## 2024-11-10 ENCOUNTER — Encounter: Admission: RE | Disposition: A | Payer: Self-pay | Source: Home / Self Care | Attending: Gastroenterology

## 2024-11-10 HISTORY — PX: ESOPHAGOGASTRODUODENOSCOPY: SHX5428

## 2024-11-10 LAB — GLUCOSE, CAPILLARY: Glucose-Capillary: 119 mg/dL — ABNORMAL HIGH (ref 70–99)

## 2024-11-10 SURGERY — EGD (ESOPHAGOGASTRODUODENOSCOPY)
Anesthesia: General

## 2024-11-10 MED ORDER — SODIUM CHLORIDE 0.9 % IV SOLN: INTRAVENOUS | Status: DC

## 2024-11-10 MED ORDER — PROPOFOL 500 MG/50ML IV EMUL
INTRAVENOUS | Status: DC | PRN
  Administered 2024-11-10: 50 mg via INTRAVENOUS
  Administered 2024-11-10: 100 ug/kg/min via INTRAVENOUS

## 2024-11-10 MED ORDER — LIDOCAINE HCL (PF) 2 % IJ SOLN
INTRAMUSCULAR | Status: DC | PRN
  Administered 2024-11-10: 100 mg via INTRADERMAL

## 2024-11-10 NOTE — Anesthesia Preprocedure Evaluation (Addendum)
 Anesthesia Evaluation  Patient identified by MRN, date of birth, ID band Patient awake    Reviewed: Allergy & Precautions, H&P , NPO status , Patient's Chart, lab work & pertinent test results, reviewed documented beta blocker date and time   Airway Mallampati: II   Neck ROM: full    Dental  (+) Upper Dentures   Pulmonary former smoker   Pulmonary exam normal        Cardiovascular Exercise Tolerance: Poor hypertension, + CAD  Normal cardiovascular exam+ Valvular Problems/Murmurs  Rhythm:regular Rate:Normal     Neuro/Psych  Headaches  negative psych ROS   GI/Hepatic Neg liver ROS,GERD  Medicated,,  Endo/Other  diabetes, Type 2    Renal/GU      Musculoskeletal   Abdominal Normal abdominal exam  (+)   Peds  Hematology Chronic lymphocytic leukemia    Anesthesia Other Findings Past Medical History: 1983: Breast cancer (HCC)     Comment:  had double mastectomy No date: Cataracts, bilateral No date: CLL (chronic lymphocytic leukemia) (HCC) No date: Diabetes (HCC) No date: GERD (gastroesophageal reflux disease) No date: Hypertension 01/2014: Leukocytosis No date: Osteopenia Past Surgical History: No date: APPENDECTOMY 1983: AUGMENTATION MAMMAPLASTY; Bilateral No date: COLONOSCOPY     Comment:  by Dr. Viktoria over 5 years ago 1983: double mastectomy with breast rescontruction 06/06/2021: ESOPHAGOGASTRODUODENOSCOPY; N/A     Comment:  Procedure: ESOPHAGOGASTRODUODENOSCOPY (EGD);  Surgeon:               Dessa Reyes ORN, MD;  Location: [facility] ENDOSCOPY;                Service: Endoscopy;  Laterality: N/A; 1983: MASTECTOMY; Bilateral     Comment:  right breast ca mastecomy only no chemo no rad bilat               implants  BMI    Body Mass Index: 26.45 kg/m     Reproductive/Obstetrics negative OB ROS                              Anesthesia Physical Anesthesia Plan  ASA: 3  Anesthesia  Plan: General   Post-op Pain Management:    Induction: Intravenous  PONV Risk Score and Plan: 3 and Propofol  infusion and TIVA  Airway Management Planned: Nasal Cannula and Natural Airway  Additional Equipment:   Intra-op Plan:   Post-operative Plan:   Informed Consent: I have reviewed the patients History and Physical, chart, labs and discussed the procedure including the risks, benefits and alternatives for the proposed anesthesia with the patient or authorized representative who has indicated his/her understanding and acceptance.     Dental Advisory Given  Plan Discussed with: Anesthesiologist, CRNA and Surgeon  Anesthesia Plan Comments:          Anesthesia Quick Evaluation

## 2024-11-10 NOTE — H&P (Signed)
 Corinn JONELLE Brooklyn, MD [facility], DHIP [address]  [place], [place] [address]  Main: [phone] Fax:  [phone] Pager: [phone]   Primary Care Physician:  Zachary Idelia LABOR, MD Primary Gastroenterologist:  Dr. Corinn JONELLE Brooklyn  Pre-Procedure History & Physical: HPI:  Melissa Curtis is a 83 y.o. female is here for an endoscopy.   Past Medical History:  Diagnosis Date   Breast cancer (HCC) 1983   had double mastectomy   Cataracts, bilateral    CLL (chronic lymphocytic leukemia) (HCC)    Diabetes (HCC)    GERD (gastroesophageal reflux disease)    Hypertension    Leukocytosis 01/2014   Osteopenia     Past Surgical History:  Procedure Laterality Date   APPENDECTOMY     AUGMENTATION MAMMAPLASTY Bilateral 1983   COLONOSCOPY     by Dr. Viktoria over 5 years ago   double mastectomy with breast rescontruction  1983   ESOPHAGOGASTRODUODENOSCOPY N/A 06/06/2021   Procedure: ESOPHAGOGASTRODUODENOSCOPY (EGD);  Surgeon: Dessa Reyes ORN, MD;  Location: [facility] ENDOSCOPY;  Service: Endoscopy;  Laterality: N/A;   ESOPHAGOGASTRODUODENOSCOPY N/A 04/19/2024   Procedure: EGD (ESOPHAGOGASTRODUODENOSCOPY);  Surgeon: Maryruth Ole DASEN, MD;  Location: [facility] ENDOSCOPY;  Service: Endoscopy;  Laterality: N/A;  DM   ESOPHAGOGASTRODUODENOSCOPY N/A 08/11/2024   Procedure: EGD (ESOPHAGOGASTRODUODENOSCOPY);  Surgeon: Brooklyn Corinn Skiff, MD;  Location: [facility] ENDOSCOPY;  Service: Gastroenterology;  Laterality: N/A;   MASTECTOMY Bilateral 1983   right breast ca mastecomy only no chemo no rad bilat implants     Prior to Admission medications   Medication Sig Start Date End Date Taking? Authorizing Provider  Accu-Chek Softclix Lancets lancets as directed Use as instructed. 04/13/21   [provider]  albuterol  (VENTOLIN  HFA) 108 (90 Base) MCG/ACT inhaler Inhale 2 puffs into the lungs every 4 (four) hours as needed. 07/31/24 07/31/25  [provider]  aspirin   EC 81 MG tablet Take 1 tablet by mouth daily.    [provider]  Bacillus Coagulans-Inulin (PROBIOTIC) 1-250 BILLION-MG CAPS Take 1 capsule by mouth daily. 08/11/24   Cyrena Mylar, MD  Blood Glucose Monitoring Suppl (GLUCOCOM BLOOD GLUCOSE MONITOR) DEVI See admin instructions. 02/21/22   [provider]  Calcium Carbonate-Vitamin D 600-400 MG-UNIT tablet Take 1 tablet by mouth 2 (two) times daily. 01/15/15 03/24/38  [provider]  cyanocobalamin (VITAMIN B12) 1000 MCG tablet Take 1,000 mcg by mouth daily.    [provider]  fexofenadine (ALLEGRA) 180 MG tablet Take 180 mg by mouth daily as needed. 03/05/17 03/24/38  [provider]  fluticasone  (FLONASE ) 50 MCG/ACT nasal spray Place 1 spray into both nostrils daily. 09/07/15   [provider]  furosemide  (LASIX ) 20 MG tablet Take 1 tablet (20 mg total) by mouth daily. 07/28/24   Jerelene Critchley, MD  glucose blood test strip  11/08/14   [provider]  losartan  (COZAAR ) 25 MG tablet Take 25 mg by mouth daily. Patient not taking: Reported on 08/11/2024 05/11/24 05/11/25  [provider]  mometasone (NASONEX) 50 MCG/ACT nasal spray Place 2 sprays into the nose. 01/01/24 12/31/24  [provider]  montelukast  (SINGULAIR ) 10 MG tablet Take 10 mg by mouth daily. 05/14/24   [provider]  Multiple Vitamins-Minerals (MULTIVITAMIN GUMMIES ADULT PO) Take 1 each by mouth daily.    [provider]  ondansetron  (ZOFRAN -ODT) 4 MG disintegrating tablet Take 1 tablet (4 mg total) by mouth every 8 (eight) hours as needed for nausea or vomiting. 08/12/24  Wouk, Devaughn Sayres, MD  pantoprazole  (PROTONIX ) 40 MG tablet Take 1 tablet (40 mg total) by mouth 2 (two) times daily before a meal. 08/12/24 08/12/25  Wouk, Devaughn Sayres, MD  pravastatin  (PRAVACHOL ) 20 MG tablet Take 20 mg by mouth daily.    [provider]  Spacer/Aero-Holding Chambers (EASIVENT) inhaler See admin  instructions. 07/31/24 07/31/25  [provider]  sucralfate  (CARAFATE ) 1 g tablet Take 1 tablet (1 g total) by mouth every 8 (eight) hours as needed. 08/12/24 08/12/25  Wouk, Devaughn Sayres, MD  traZODone  (DESYREL ) 100 MG tablet Take 1 tablet by mouth at bedtime. 03/04/24 03/04/25  [provider]  venlafaxine  XR (EFFEXOR -XR) 75 MG 24 hr capsule Take 75 mg by mouth daily with breakfast.    [provider]    Allergies as of 08/21/2024 - Review Complete 08/11/2024  Allergen Reaction Noted   Statins Other (See Comments) 10/03/2015   Iodine  06/16/2014   Shellfish allergy  10/03/2015   Sulfa antibiotics  06/16/2014    Family History  Problem Relation Age of Onset   Leukemia Father 10   Non-Hodgkin's lymphoma Mother 23   Colon cancer Paternal Grandmother        age unknown   Breast cancer Cousin        paternal cancer    Social History   Socioeconomic History   Marital status: Widowed    Spouse name: Not on file   Number of children: Not on file   Years of education: Not on file   Highest education level: Not on file  Occupational History   Not on file  Tobacco Use   Smoking status: Former    Current packs/day: 0.00    Types: Cigarettes    Quit date: 12/03/1995    Years since quitting: 28.9   Smokeless tobacco: Never  Vaping Use   Vaping status: Never Used  Substance and Sexual Activity   Alcohol use: No    Alcohol/week: 0.0 standard drinks of alcohol   Drug use: No   Sexual activity: Yes    Partners: Male  Other Topics Concern   Not on file  Social History Narrative   Not on file   Social Drivers of Health   Financial Resource Strain: Low Risk  (06/11/2024)   Received from [facility]   Overall Financial Resource Strain (CARDIA)    Difficulty of Paying Living Expenses: Not hard at all  Food Insecurity: No Food Insecurity (08/11/2024)   Hunger Vital Sign    Worried About Running Out of Food in the Last Year: Never true    Ran  Out of Food in the Last Year: Never true  Transportation Needs: No Transportation Needs (08/11/2024)   PRAPARE - Administrator, Civil Service (Medical): No    Lack of Transportation (Non-Medical): No  Physical Activity: Not on file  Stress: Not on file  Social Connections: Unknown (08/11/2024)   Social Connection and Isolation Panel    Frequency of Communication with Friends and Family: More than three times a week    Frequency of Social Gatherings with Friends and Family: More than three times a week    Attends Religious Services: Patient declined    Active Member of Clubs or Organizations: Yes    Attends Banker Meetings: Patient declined    Marital Status: Patient declined  Intimate Partner Violence: Not At Risk (08/11/2024)   Humiliation, Afraid, Rape, and Kick questionnaire    Fear of Current  or Ex-Partner: No    Emotionally Abused: No    Physically Abused: No    Sexually Abused: No    Review of Systems: See HPI, otherwise negative ROS  Physical Exam: BP (!) 163/85   Pulse 68   Temp 97.9 F (36.6 C) (Temporal)   Resp 20   Ht 5' 2 (1.575 m)   Wt 57.7 kg   SpO2 100%   BMI 23.27 kg/m  General:   Alert,  pleasant and cooperative in NAD Head:  Normocephalic and atraumatic. Neck:  Supple; no masses or thyromegaly. Lungs:  Clear throughout to auscultation.    Heart:  Regular rate and rhythm. Abdomen:  Soft, nontender and nondistended. Normal bowel sounds, without guarding, and without rebound.   Neurologic:  Alert and  oriented x4;  grossly normal neurologically.  Impression/Plan: MISK GALENTINE is here for an endoscopy to be performed for h/o erosive esophagitis  Risks, benefits, limitations, and alternatives regarding  endoscopy have been reviewed with the patient.  Questions have been answered.  All parties agreeable.   Corinn Brooklyn, MD  11/10/2024, 9:58 AM

## 2024-11-10 NOTE — Op Note (Signed)
 [facility] Patient Name: Melissa Curtis Procedure Date: 11/10/2024 9:56 AM MRN: [id number] Account #: [id number] Date of Birth: 07-Jun-1941 Admit Type: Outpatient Age: 83 Room: [facility] ENDO ROOM 4 Gender: Female Note Status: Finalized Instrument Name: Upper GI Scope [id number] Procedure:             Upper GI endoscopy Indications:           Follow-up of reflux esophagitis Providers:             Corinn Jess Brooklyn MD, MD Referring MD:          Sionne A. Zachary MD, MD (Referring MD) Medicines:             General Anesthesia Complications:         No immediate complications. Estimated blood loss: None. Procedure:             Pre-Anesthesia Assessment:                        - Prior to the procedure, a History and Physical was                         performed, and patient medications and allergies were                         reviewed. The patient is competent. The risks and                         benefits of the procedure and the sedation options and                         risks were discussed with the patient. All questions                         were answered and informed consent was obtained.                         Patient identification and proposed procedure were                         verified by the physician, the nurse, the                         anesthesiologist, the anesthetist and the technician                         in the pre-procedure area in the procedure room in the                         endoscopy suite. Mental Status Examination: alert and                         oriented. Airway Examination: normal oropharyngeal                         airway and neck mobility. Respiratory Examination:                         clear to auscultation. CV Examination: normal. ASA  Grade Assessment: III - A patient with severe systemic                         disease. After reviewing the risks and benefits, the                          patient was deemed in satisfactory condition to                         undergo the procedure. The anesthesia plan was to use                         general anesthesia. Immediately prior to                         administration of medications, the patient was                         re-assessed for adequacy to receive sedatives. The                         heart rate, respiratory rate, oxygen saturations,                         blood pressure, adequacy of pulmonary ventilation, and                         response to care were monitored throughout the                         procedure. The physical status of the patient was                         re-assessed after the procedure.                        After obtaining informed consent, the endoscope was                         passed under direct vision. Throughout the procedure,                         the patient's blood pressure, pulse, and oxygen                         saturations were monitored continuously. The Endoscope                         was introduced through the mouth, and advanced to the                         second part of duodenum. The upper GI endoscopy was                         accomplished without difficulty. The patient tolerated                         the procedure well. Findings:      LA  Grade A (one or more mucosal breaks less than 5 mm, not extending       between tops of 2 mucosal folds) esophagitis with no bleeding was found       at the gastroesophageal junction.      The entire examined stomach was normal.      The duodenal bulb and second portion of the duodenum were normal.      The cardia and gastric fundus were normal on retroflexion. Impression:            - LA Grade A reflux esophagitis with no bleeding.                        - Normal stomach.                        - Normal duodenal bulb and second portion of the                         duodenum.                        - No  specimens collected. Recommendation:        - Discharge patient to home (with escort).                        - Resume previous diet today.                        - Continue present medications.                        - Follow an antireflux regimen indefinitely.                        - Continue present medications.                        - Continue protonix  40mg  twice daily for 1 more month                         then daily indefinitely Procedure Code(s):     --- Professional ---                        [phone], Esophagogastroduodenoscopy, flexible,                         transoral; diagnostic, including collection of                         specimen(s) by brushing or washing, when performed                         (separate procedure) Diagnosis Code(s):     --- Professional ---                        K21.00, Gastro-esophageal reflux disease with                         esophagitis, without bleeding CPT copyright 2022 American Medical Association. All rights reserved. The codes documented in this report  are preliminary and upon coder review may  be revised to meet current compliance requirements. Dr. Corinn Brooklyn Corinn Jess Brooklyn MD, MD 11/10/2024 10:24:12 AM This report has been signed electronically. Number of Addenda: 0 Note Initiated On: 11/10/2024 9:56 AM Estimated Blood Loss:  Estimated blood loss: none.      [facility]

## 2024-11-10 NOTE — Transfer of Care (Signed)
 Immediate Anesthesia Transfer of Care Note  Patient: Melissa Curtis  Procedure(s) Performed: EGD (ESOPHAGOGASTRODUODENOSCOPY)  Patient Location: Endoscopy Unit  Anesthesia Type:General  Level of Consciousness: drowsy  Airway & Oxygen Therapy: Patient Spontanous Breathing and Patient connected to nasal cannula oxygen  Post-op Assessment: Report given to RN and Post -op Vital signs reviewed and stable  Post vital signs: Reviewed and stable  Last Vitals:  Vitals Value Taken Time  BP    Temp    Pulse 64 11/10/24 10:21  Resp 21 11/10/24 10:21  SpO2 96 % 11/10/24 10:21  Vitals shown include unfiled device data.  Last Pain:  Vitals:   11/10/24 0920  TempSrc: Temporal  PainSc: 0-No pain         Complications: There were no known notable events for this encounter.

## 2024-11-11 NOTE — Anesthesia Postprocedure Evaluation (Signed)
 Anesthesia Post Note  Patient: Melissa Curtis  Procedure(s) Performed: EGD (ESOPHAGOGASTRODUODENOSCOPY)  Patient location during evaluation: Endoscopy Anesthesia Type: General Level of consciousness: awake and alert Pain management: pain level controlled Vital Signs Assessment: post-procedure vital signs reviewed and stable Respiratory status: spontaneous breathing, nonlabored ventilation and respiratory function stable Cardiovascular status: blood pressure returned to baseline and stable Postop Assessment: no apparent nausea or vomiting Anesthetic complications: no   There were no known notable events for this encounter.   Last Vitals:  Vitals:   11/10/24 1027 11/10/24 1038  BP: (!) 153/60 (!) 174/78  Pulse: 60 64  Resp: (!) 21 (!) 23  Temp:    SpO2: 99% 97%    Last Pain:  Vitals:   11/11/24 0743  TempSrc:   PainSc: 0-No pain                 Camellia Merilee Louder

## 2024-12-08 NOTE — Progress Notes (Signed)
 " Chief Complaint:   Chief Complaint  Patient presents with   Hypertension    Subjective   HPI  Melissa Curtis is a 84 y.o. female established patient who presents for Hypertension She presents here today with her granddaughter Daune who she provides permission verbally to be present during her office visit today. History of Present Illness Melissa Curtis is an 84 year old female who presents with a recent fall resulting in a spiral ankle fracture.  She experienced a fall during the night while using her stationary walker, attributed to grogginess from trazodone . Initially, she thought she had twisted her ankle but continued to ambulate with her walker despite bruising. The following day, significant swelling prompted further evaluation, revealing a spiral fracture of the ankle.  She has been prescribed tramadol for pain management but has not taken it due to concerns about potential memory effects. Instead, she manages her pain with two extra strength Tylenol  at night, which helps her sleep without pain.  She uses a stationary walker upstairs and a rolling walker downstairs. The stationary walker has wheels in the front and skis in the back, which may have contributed to the fall due to uneven flooring. A stair lift is installed for mobility between floors.  There is a concern about her memory, described as 'loopy' moments, noted since the fall. A recent incident involved confusion and an inappropriate attempt to use the restroom.  CLL and currently follows up closely with hematology at [facility] Dr. B.  This is been stable and has been asymptomatic.  Has serially overall myeloid angiopathy.  She has been asymptomatic.  Type 2 diabetes patient is due for repeat A1c.  No hypoglycemic episodes.  Has not had Polly aphasia polydipsia.  She has had polyuria however this has been related to having urinary incontinence but she expresses that she is only concerned about having a UTI.  Review of  Systems As per hpi Patient Active Problem List  Diagnosis   Breast cancer, right breast (CMS/HHS-HCC)   Diabetes mellitus, type II (CMS/HHS-HCC)   IBS (irritable bowel syndrome)   PVC (premature ventricular contraction)   CAD (coronary artery disease)   Back pain, unspecified back pain laterality, unspecified location   Osteopenia   CLL (chronic lymphocytic leukemia) (CMS/HHS-HCC)   Tubular adenoma of colon   Hypokalemia   Gastroesophageal reflux disease without esophagitis   Controlled type 2 diabetes mellitus without complication, without long-term current use of insulin  (CMS/HHS-HCC)   Essential hypertension   Hyperlipidemia, mixed   Need for vaccination   Orthostatic dizziness   Aortic atherosclerosis   Headache   Malignant neoplasm of breast (CMS/HHS-HCC)   Chronic lymphocytic leukemia (CMS/HHS-HCC)   Adaptive colitis   Tubular adenoma of colon   Valvular regurgitation   Ascending aorta dilatation ()   Allergic rhinitis   Sensorineural hearing loss (SNHL) of both ears   Kyphoscoliosis and scoliosis   Gait instability   Mild late onset Alzheimer's dementia without behavioral disturbance, psychotic disturbance, mood disturbance, or anxiety (CMS-HCC)   Cerebral amyloid angiopathy  (CMS/HHS-HCC)   Primary insomnia   Frequent falls   Urinary incontinence    Outpatient Medications Prior to Visit  Medication Sig Dispense Refill   ACCU-CHEK GUIDE TEST STRIPS test strip TEST ONCE DAILY 100 strip 3   albuterol  MDI, PROVENTIL , VENTOLIN , PROAIR , HFA 90 mcg/actuation inhaler Inhale 2 inhalations into the lungs every 4 (four) hours as needed 1 each 1   blood-glucose meter (ACCU-CHEK GUIDE GLUCOSE METER) Misc as directed  1 each 0   calcium carbonate-vit D3-min 600 mg calcium- 400 unit Tab Take by mouth     cyanocobalamin (VITAMIN B12) 1000 MCG tablet Take 1,000 mcg by mouth at bedtime Reported on 03/05/2016     FUROsemide  (LASIX ) 20 MG tablet  Take 1 tablet (20 mg total) by mouth once daily 90 tablet 3   inhalational spacer (AEROCHAMBER) spacer Use as instructed. 1 each 2   lancets (ACCU-CHEK SOFTCLIX LANCETS) as directed Use as instructed. 100 each 3   mometasone (NASONEX) 50 mcg/actuation nasal spray Place 2 sprays into both nostrils once daily 17 g 11   montelukast  (SINGULAIR ) 10 mg tablet Take 1 tablet (10 mg total) by mouth once daily 90 tablet 3   MULTIVITAMIN WITH FOLIC ACID (ADULT ONE DAILY GUMMIES ORAL) Take by mouth.     omeprazole (PRILOSEC) 20 MG DR capsule Take 20 mg by mouth 2 (two) times daily     ondansetron  (ZOFRAN -ODT) 4 MG disintegrating tablet Take 4 mg by mouth every 8 (eight) hours as needed     pantoprazole  (PROTONIX ) 40 MG DR tablet Take 40 mg by mouth 2 (two) times daily before meals     pravastatin  (PRAVACHOL ) 20 MG tablet TAKE 1 TABLET(20 MG) BY MOUTH AT BEDTIME 90 tablet 3   PROPYLENE GLYCOL/PEG 400 (SYSTANE OPHTH) Apply 1 drop to eye once daily as needed (dry eyes / irritation).     sucralfate  (CARAFATE ) 1 gram tablet Take 1 g by mouth     traZODone  (DESYREL ) 50 MG tablet Take 1.5 tablets (75 mg total) by mouth at bedtime 45 tablet 11   venlafaxine  (EFFEXOR -XR) 75 MG XR capsule Take 1 capsule (75 mg total) by mouth once daily 90 capsule 3   lancing device with lancets kit Use 1 each once daily e11.9 50 each 12   lidocaine  (LIDODERM ) 5 % patch Place 1 patch onto the skin daily (Patient not taking: Reported on 11/23/2024)     UNABLE TO FIND Med Name: Prevagen (Patient not taking: Reported on 11/23/2024)     No facility-administered medications prior to visit.      Objective   Vitals:   12/08/24 1401  BP: (!) 153/82  Pulse: 60  Resp: 14  Weight: 57 kg (125 lb 10.6 oz)  PainSc: 0-No pain   Body mass index is 22.98 kg/m.  Home Vitals:     Physical Exam Physical Exam   Constitutional: alert and in NADalert and oriented x 3 Conversation: normal and appropriate Respiratory:  clear to auscultation, without rales or wheezes Cardiovascular: regular rate and rhythm and without murmurs, rubs or gallops  Extremities: no lower extremity edema, dorsalis pedis pulses normal  Results Diagnostic Colonoscopy (2016): Two polyps excised, including one tubular adenoma; no evidence of malignancy. Upper endoscopy: Mucosa ninety percent healed at most recent evaluation.     Assessment/Plan:   Assessment & Plan Lower leg fracture with recurrent falls Due to NPH Sustained a spiral fracture of the lower leg after a fall, exacerbated by trazodone -induced grogginess. Mobility is further impaired by the weight of the boot on the weak leg. Recurrent falls are a concern due to uneven flooring and walker setup. Tramadol was prescribed but not taken due to concerns about memory effects and increased fall risk. Tylenol  is being used for pain management, which is effective at night. - Continue using Tylenol  for pain management as needed. - Avoid tramadol due to potential memory effects and increased fall risk. - Consider adjusting walker wheels for better  stability. - Ensure safe home environment with stair lift and walk-in shower.  Hypertension Blood pressure was slightly elevated this morning. - Rechecked blood pressure during the visit.  -  Continue current medication(s) -  Encouraged dietary sodium restriction/DASH diet -  Recommended exercise -  Recommended regular aerobic exercise. -  Recommend home blood pressure monitoring, to bring results in next visit -  Discussed need for and importance of continued work on weight loss -  Discussed need for med compliance -  Reviewed risks of HTN, principles of treatment and consequences of untreated HTN - Goal blood pressure less than 130/80 -  Follow up in 6 month for BP recheck and follow up  DMii  -  Continue current medications -  Instructed to monitor blood sugars once a day. -  Reminded to bring blood sugar log and/or meter  to every visit -  Nutrition/General diabetes counseling: discussed the need for weight loss, focused on the need to adhere to the prescribed carbohydrate controlled diet  with a 45-60g CHO per meal with plate model as goal, focused on the need for regular aerobic exercise. Goal is 30 minutes 5 days per week, but any activity is helpful, discussed low glycemic index carbohydrates versus high fiber carbohydrates versus low carbohydrate diet, discussed DASH diet and encouraged to avoid sugar containing soda and tea, and fruit juice -avoid foods containing fructose corn syrup -  Encouraged regular aerobic exercise, goal is 30 minutes 5 times a week -  Discussed management of hypoglycemia (If BS < 70 at any time, tx with simple sugar -  cup of juice or 3 glucose tablets (available OTC) or  can of regular soda. Recheck blood sugar in 30 minutes)  -  Daily EC Aspirin  81 mg recommended -Refer to the American Diabetic association website for further details  RTC IN 3 MOS   Cognitive impairment /NPH Experiencing moments of confusion, possibly related to medication effects or other factors. Recent episode of confusion while attempting to use the restroom. - Continue to monitor cognitive function and report any significant changes F/u with geriatrics/ neurology  Urinary incontinence Experiencing urinary incontinence. Concerns about potential UTI due to strong-smelling urine and episodes of confusion. - Provided urine cup for sample collection to rule out UTI.   Cerebral amyloid angiopathy  (CMS/HHS-HCC) currently asymptomatic. Continue current treatment plan and followup at least yearly.  Chronic lymphocytic leukemia (CMS/HHS-HCC) currently asymptomatic. Continue current treatment plan and followup at least yearly. F/u [facility]       This visit was coded based on medical decision making (MDM).           Future Appointments     Date/Time Provider Department Center Visit Type    12/13/2024 3:00 PM [facility] CARD US  1 [facility] C ECHO COMPLETE   12/20/2024 11:30 AM Ashley Eva Dawn, DPM [facility] PODIATRY RETURN   12/21/2024 12:00 PM Elodie Ronal Tinnie Launie, NP [facility] C FOLLOW UP   01/26/2025 8:40 AM (Arrive by 8:25 AM) Alger Michal FERNS, MD [facility] RETURN VISIT   04/06/2025 10:00 AM (Arrive by 9:45 AM) POPULATION HEALTH NURSE - [facility] WELLNESS   04/06/2025 11:00 AM (Arrive by 10:45 AM) Zachary Idelia Sherita Jock, MD [facility] OFFICE VISIT   12/14/2025 10:00 AM (Arrive by 9:45 AM) Jane Delmar Pike, NP [facility] FOLLOW UP  There are no Patient Instructions on file for this visit.  An after visit summary was provided for the patient either in written format (printed) or through MyChart.  This note has been created using automated tools and reviewed for accuracy by [facility] CID [place].   This visit was coded based on medical decision making (MDM).    Portions of this note were generated with voice recognition software (DragonMedicalOne dictation software / speech recognition software/ABRIDGE) and may contain unintended transcription errors as interpreted by the software. I apologize for any  typographical errors that were not detected and corrected.        "

## 2024-12-20 NOTE — Progress Notes (Signed)
 CC: Chief Complaint  Patient presents with   right ankle    Follow up for right ankle fracture    Melissa Curtis presents for follow up of left ankle fracture.  She is over a month out.  She is doing well.  Been in the boot the whole time.  PMH: Past Medical History:  Diagnosis Date   Allergic rhinitis 01/07/2024   Allergic state 1980   Iodine, sulfa   Anxiety    Arthritis 2016   signs in hands   Breast cancer, right breast (CMS/HHS-HCC) 1988's   CAD (coronary artery disease)    Cardiac murmur 03/17/2014   Cataract cortical, senile 2008   CLL (chronic lymphocytic leukemia) (CMS/HHS-HCC) 09/22/2015   Cough    dry-no congestion   Depression    Diabetes mellitus type 2, uncomplicated (CMS/HHS-HCC)    No medication   GERD (gastroesophageal reflux disease)    Hyperlipidemia    Hypertension    IBS (irritable bowel syndrome) 02/22/2014   Diagnosed 2014- Followed by Dr. Viktoria Glenn   Meningioma (CMS/HHS-HCC)    Osteopenia    Palpitations    PONV (postoperative nausea and vomiting)    PVC (premature ventricular contraction)    Sleep apnea 2010   Does not use CPAP   Squamous cell carcinoma of forehead    Tubular adenoma of colon 10/20/2015   10/2015 colonoscopy    Medication: Current Outpatient Medications on File Prior to Visit  Medication Sig Dispense Refill   ACCU-CHEK GUIDE TEST STRIPS test strip TEST ONCE DAILY 100 strip 3   albuterol  MDI, PROVENTIL , VENTOLIN , PROAIR , HFA 90 mcg/actuation inhaler Inhale 2 inhalations into the lungs every 4 (four) hours as needed 1 each 1   blood-glucose meter (ACCU-CHEK GUIDE GLUCOSE METER) Misc as directed 1 each 0   calcium carbonate-vit D3-min 600 mg calcium- 400 unit Tab Take by mouth     cyanocobalamin (VITAMIN B12) 1000 MCG tablet Take 1,000 mcg by mouth at bedtime Reported on 03/05/2016     FUROsemide  (LASIX ) 20 MG tablet Take 1 tablet (20 mg total) by mouth once daily 90 tablet 3   inhalational spacer  (AEROCHAMBER) spacer Use as instructed. 1 each 2   lancets (ACCU-CHEK SOFTCLIX LANCETS) as directed Use as instructed. 100 each 3   mometasone (NASONEX) 50 mcg/actuation nasal spray Place 2 sprays into both nostrils once daily 17 g 11   montelukast  (SINGULAIR ) 10 mg tablet Take 1 tablet (10 mg total) by mouth once daily 90 tablet 3   MULTIVITAMIN WITH FOLIC ACID (ADULT ONE DAILY GUMMIES ORAL) Take by mouth.     omeprazole (PRILOSEC) 20 MG DR capsule Take 20 mg by mouth 2 (two) times daily     ondansetron  (ZOFRAN -ODT) 4 MG disintegrating tablet Take 4 mg by mouth every 8 (eight) hours as needed     pantoprazole  (PROTONIX ) 40 MG DR tablet Take 40 mg by mouth 2 (two) times daily before meals     pravastatin  (PRAVACHOL ) 20 MG tablet TAKE 1 TABLET(20 MG) BY MOUTH AT BEDTIME 90 tablet 3   PROPYLENE GLYCOL/PEG 400 (SYSTANE OPHTH) Apply 1 drop to eye once daily as needed (dry eyes / irritation).     sucralfate  (CARAFATE ) 1 gram tablet Take 1 g by mouth     traZODone  (DESYREL ) 50 MG tablet Take 1.5 tablets (75 mg total) by mouth at bedtime 45 tablet 11   venlafaxine  (EFFEXOR -XR) 75 MG XR capsule Take 1 capsule (75 mg total) by mouth once daily 90 capsule 3  lancing device with lancets kit Use 1 each once daily e11.9 50 each 12   lidocaine  (LIDODERM ) 5 % patch Place 1 patch onto the skin daily (Patient not taking: Reported on 11/23/2024)     UNABLE TO FIND Med Name: Prevagen (Patient not taking: Reported on 11/23/2024)     No current facility-administered medications on file prior to visit.    Allergies: Allergies as of 12/20/2024 - Reviewed 12/20/2024  Allergen Reaction Noted   Iodine and iodide containing products Swelling and Vomiting    Lipitor [atorvastatin] Muscle Pain 05/09/2014   Shellfish containing products Diarrhea, Swelling, and Vomiting 10/03/2015   Simvastatin Muscle Pain 05/09/2014   Statins-hmg-coa reductase inhibitors Other (See Comments) 10/03/2015   Sulfa  (sulfonamide antibiotics) Other (See Comments)    Sulfasalazine Other (See Comments) 06/16/2014    Surgical History: Past Surgical History:  Procedure Laterality Date   TONSILLECTOMY  1959   TUBAL LIGATION  1969   double mastectomy and reconstruction Bilateral 1983   REMOVAL OVARIAN CYST Right 1999   benign   EGD  02/27/2000   COLONOSCOPY  03/11/2001   COLONOSCOPY W/BIOPSY N/A 10/16/2015   Procedure: Colonoscopy ;  Surgeon: Eleanor Earnie Console, MD;  Location: [facility];  Service: Gastroenterology;  Laterality: N/A;  r/o colitis   EGD  06/06/2021   Benign appearing esophageal stenosis. Dilated/No repeat/JWB   EGD @ [facility]  04/19/2024   EGD with dilation, no path. Repeat prn/CTL   EGD @ [facility]  08/11/2024   Recommend repeat EGD with me in 3 months to confirm healing of erosive esophagitis/RRV   EGD @ [facility]  11/10/2024   Normal/ Repeat As Needed/ RRVanga   APPENDECTOMY  7th grade   Cardiac cath     CATARACT EXTRACTION     MASTECTOMY      Social History: Social History   Socioeconomic History   Marital status: Widowed    Spouse name: Elgin   Number of children: 2  Occupational History   Occupation: Futures Trader - [employer]  Tobacco Use   Smoking status: Former    Current packs/day: 0.00    Average packs/day: 0.8 packs/day for 25.0 years (18.8 ttl pk-yrs)    Types: Cigarettes    Start date: 09/30/1971    Quit date: 09/29/1996    Years since quitting: 28.2   Smokeless tobacco: Never  Vaping Use   Vaping status: Never Used  Substance and Sexual Activity   Alcohol use: Yes    Alcohol/week: 1.0 standard drink of alcohol    Types: 1 Glasses of wine per week    Comment: Wine once a week   Drug use: No   Sexual activity: Not Currently    Partners: Male  Other Topics Concern   Would you please tell us  about the people who live in your home, your pets, or anything else important to your social life? Yes    Comment: Husband,  dog, grandson part time, kitten  Social History Narrative   Married, retired.      04/02/2024   Military Service: None   Likes/Enjoys/What fills your day?:  Narrative   Home: Two Story   Your Bedrooms is on: Second Level   Fewest Steps to enter the home: 13 with railings   Other persons in the home: grand daughter   Pets: Child Psychotherapist you use daily: None   Medical equipment available in the home: Elevated toilet seat, blood pressure machine   Dental: no significant dental problems.  Upper plate: Last screening Date: April 2025 Screening is: Up to date - Goes every 4 months   Vision: Denies any issues with Vision. Last screening Date: November 2024 Screening is: Up to date   Hearing: Bilateral Hearing Aids. Last screening Date: Fall 2024 Screening is: Up to date   Dermatology: Denies any areas of concern on skin. Last screening Date: earlier this year 2025 Screening is: Up to date       Social Drivers of Corporate Investment Banker Strain: Low Risk  (11/29/2024)   Overall Financial Resource Strain (CARDIA)    Difficulty of Paying Living Expenses: Not very hard  Food Insecurity: No Food Insecurity (11/29/2024)   Hunger Vital Sign    Worried About Running Out of Food in the Last Year: Never true    Ran Out of Food in the Last Year: Never true  Transportation Needs: No Transportation Needs (11/29/2024)   PRAPARE - Administrator, Civil Service (Medical): No    Lack of Transportation (Non-Medical): No  Social Connections: Unknown (08/11/2024)   Received from [facility]   Social Connection and Isolation Panel    In a typical week, how many times do you talk on the phone with family, friends, or neighbors?: More than three times a week    How often do you get together with friends or relatives?: More than three times a week    How often do you attend church or religious services?: Patient declined    Do you belong to any clubs or organizations such as  church groups, unions, fraternal or athletic groups, or school groups?: Yes    How often do you attend meetings of the clubs or organizations you belong to?: Patient declined    Are you married, widowed, divorced, separated, never married, or living with a partner?: Patient declined  Housing Stability: Low Risk  (11/29/2024)   Housing Stability Vital Sign    Unable to Pay for Housing in the Last Year: No    Number of Times Moved in the Last Year: 0    Homeless in the Last Year: No   Social History   Tobacco Use  Smoking Status Former   Current packs/day: 0.00   Average packs/day: 0.8 packs/day for 25.0 years (18.8 ttl pk-yrs)   Types: Cigarettes   Start date: 09/30/1971   Quit date: 09/29/1996   Years since quitting: 28.2  Smokeless Tobacco Never    Past Family History: Family History  Problem Relation Age of Onset   Lymphoma Mother    Hip fracture Mother    Leukemia Father    Diabetes type II Father    Glaucoma Father    Diabetes Father    Diabetes type II Sister    Diabetes type II Maternal Aunt    Diabetes type II Paternal Aunt    Diabetes type II Paternal Aunt    Coronary Artery Disease (Blocked arteries around heart) Maternal Grandfather    Myocardial Infarction (Heart attack) Maternal Grandfather    Colon polyps Other    Diabetes Paternal Grandfather    Colon cancer Paternal Grandmother    Diabetes type II Sister    Diabetes Maternal Aunt    Diabetes Paternal Aunt    Diabetes type II Sister    Anesthesia problems Neg Hx     Review of Systems:  A comprehensive 14 point ROS was performed, reviewed, and the pertinent orthopaedic findings are documented in the HPI.  Objective: General/Constitutional: No apparent distress:  well-nourished and well developed.   Neurovascular status is unchanged.  Derm: No ulcers or pre-ulcerative lesions  Ortho/MS: No instability with range of motion to the ankle.  I can palpate the fibular  fracture prominence.  There is minimal pain with palpation  Xrays: Left ankle x-ray shows good alignment of the ankle joint mortise.  No signs of gapping of the fibular fracture.  Medial and lateral gutters well-maintained  Lab Results  Component Value Date   WBC 9.0 09/03/2024   HGB 14.5 09/03/2024   HCT 45.3 (H) 09/03/2024   PLT 156 09/03/2024    Assessment: Encounter Diagnosis  Name Primary?   Closed low lateral malleolus fracture, left, with routine healing, subsequent encounter Yes    Plan: Continue with minimal weightbearing in the boot for the next month.  She can sleep without the boot.  She can use a walker and transfer as needed.  I will see her back in a month rex-ray hopefully begin strengthening and weightbearing at that time.  We discussed using strengthening bands over the next month.   Orders Placed This Encounter  Procedures   X-ray ankle right 3 plus views

## 2024-12-21 ENCOUNTER — Observation Stay: Admission: EM | Admit: 2024-12-21 | Discharge: 2024-12-23 | Disposition: A | Discharge status: Home Health

## 2024-12-21 ENCOUNTER — Emergency Department

## 2024-12-21 ENCOUNTER — Other Ambulatory Visit: Payer: Self-pay

## 2024-12-21 DIAGNOSIS — Z8719 Personal history of other diseases of the digestive system: Secondary | ICD-10-CM | POA: Insufficient documentation

## 2024-12-21 DIAGNOSIS — I110 Hypertensive heart disease with heart failure: Secondary | ICD-10-CM | POA: Diagnosis not present

## 2024-12-21 DIAGNOSIS — Z853 Personal history of malignant neoplasm of breast: Secondary | ICD-10-CM | POA: Insufficient documentation

## 2024-12-21 DIAGNOSIS — E859 Amyloidosis, unspecified: Secondary | ICD-10-CM | POA: Insufficient documentation

## 2024-12-21 DIAGNOSIS — R112 Nausea with vomiting, unspecified: Principal | ICD-10-CM | POA: Insufficient documentation

## 2024-12-21 DIAGNOSIS — Z79899 Other long term (current) drug therapy: Secondary | ICD-10-CM | POA: Insufficient documentation

## 2024-12-21 DIAGNOSIS — M6281 Muscle weakness (generalized): Secondary | ICD-10-CM | POA: Insufficient documentation

## 2024-12-21 DIAGNOSIS — K208 Other esophagitis without bleeding: Secondary | ICD-10-CM | POA: Insufficient documentation

## 2024-12-21 DIAGNOSIS — I5032 Chronic diastolic (congestive) heart failure: Secondary | ICD-10-CM | POA: Insufficient documentation

## 2024-12-21 DIAGNOSIS — Z8701 Personal history of pneumonia (recurrent): Secondary | ICD-10-CM | POA: Diagnosis not present

## 2024-12-21 DIAGNOSIS — Z7982 Long term (current) use of aspirin: Secondary | ICD-10-CM | POA: Diagnosis not present

## 2024-12-21 DIAGNOSIS — I680 Cerebral amyloid angiopathy: Secondary | ICD-10-CM | POA: Diagnosis not present

## 2024-12-21 DIAGNOSIS — C9111 Chronic lymphocytic leukemia of B-cell type in remission: Secondary | ICD-10-CM | POA: Diagnosis not present

## 2024-12-21 DIAGNOSIS — F0280 Dementia in other diseases classified elsewhere without behavioral disturbance: Secondary | ICD-10-CM | POA: Insufficient documentation

## 2024-12-21 DIAGNOSIS — K222 Esophageal obstruction: Secondary | ICD-10-CM | POA: Insufficient documentation

## 2024-12-21 DIAGNOSIS — E119 Type 2 diabetes mellitus without complications: Secondary | ICD-10-CM | POA: Insufficient documentation

## 2024-12-21 DIAGNOSIS — Z87891 Personal history of nicotine dependence: Secondary | ICD-10-CM | POA: Insufficient documentation

## 2024-12-21 DIAGNOSIS — I160 Hypertensive urgency: Secondary | ICD-10-CM | POA: Insufficient documentation

## 2024-12-21 DIAGNOSIS — G301 Alzheimer's disease with late onset: Secondary | ICD-10-CM | POA: Diagnosis not present

## 2024-12-21 DIAGNOSIS — K2210 Ulcer of esophagus without bleeding: Secondary | ICD-10-CM | POA: Diagnosis present

## 2024-12-21 DIAGNOSIS — E854 Organ-limited amyloidosis: Secondary | ICD-10-CM | POA: Diagnosis present

## 2024-12-21 DIAGNOSIS — C9110 Chronic lymphocytic leukemia of B-cell type not having achieved remission: Secondary | ICD-10-CM | POA: Diagnosis present

## 2024-12-21 LAB — URINALYSIS, ROUTINE W REFLEX MICROSCOPIC
Bacteria, UA: NONE SEEN
Bilirubin Urine: NEGATIVE
Glucose, UA: >=500 mg/dL — AB
Hgb urine dipstick: NEGATIVE
Ketones, ur: NEGATIVE mg/dL
Leukocytes,Ua: NEGATIVE
Nitrite: NEGATIVE
Protein, ur: 30 mg/dL — AB
Specific Gravity, Urine: 1.021 (ref 1.005–1.030)
pH: 5 (ref 5.0–8.0)

## 2024-12-21 LAB — CBC
HCT: 44.7 % (ref 36.0–46.0)
Hemoglobin: 15 g/dL (ref 12.0–15.0)
MCH: 32.5 pg (ref 26.0–34.0)
MCHC: 33.6 g/dL (ref 30.0–36.0)
MCV: 96.8 fL (ref 80.0–100.0)
Platelets: 181 K/uL (ref 150–400)
RBC: 4.62 MIL/uL (ref 3.87–5.11)
RDW: 13 % (ref 11.5–15.5)
WBC: 17.6 K/uL — ABNORMAL HIGH (ref 4.0–10.5)
nRBC: 0 % (ref 0.0–0.2)

## 2024-12-21 LAB — COMPREHENSIVE METABOLIC PANEL WITH GFR
ALT: 8 U/L (ref 0–44)
AST: 16 U/L (ref 15–41)
Albumin: 4.8 g/dL (ref 3.5–5.0)
Alkaline Phosphatase: 76 U/L (ref 38–126)
Anion gap: 14 (ref 5–15)
BUN: 18 mg/dL (ref 8–23)
CO2: 27 mmol/L (ref 22–32)
Calcium: 9.9 mg/dL (ref 8.9–10.3)
Chloride: 99 mmol/L (ref 98–111)
Creatinine, Ser: 0.7 mg/dL (ref 0.44–1.00)
GFR, Estimated: >60 mL/min
Glucose, Bld: 271 mg/dL — ABNORMAL HIGH (ref 70–99)
Potassium: 4 mmol/L (ref 3.5–5.1)
Sodium: 141 mmol/L (ref 135–145)
Total Bilirubin: 1 mg/dL (ref 0.0–1.2)
Total Protein: 6.9 g/dL (ref 6.5–8.1)

## 2024-12-21 LAB — GLUCOSE, CAPILLARY: Glucose-Capillary: 187 mg/dL — ABNORMAL HIGH (ref 70–99)

## 2024-12-21 LAB — LIPASE, BLOOD: Lipase: 23 U/L (ref 11–51)

## 2024-12-21 MED ORDER — ENALAPRILAT 1.25 MG/ML IV SOLN
0.6250 mg | Freq: Once | INTRAVENOUS | Status: AC
  Administered 2024-12-21: 0.625 mg via INTRAVENOUS
  Filled 2024-12-21: qty 0.5

## 2024-12-21 MED ORDER — INSULIN ASPART 100 UNIT/ML IJ SOLN
0.0000 [IU] | INTRAMUSCULAR | Status: DC
  Administered 2024-12-21: 2 [IU] via SUBCUTANEOUS
  Administered 2024-12-22: 1 [IU] via SUBCUTANEOUS
  Administered 2024-12-22: 2 [IU] via SUBCUTANEOUS
  Administered 2024-12-22: 1 [IU] via SUBCUTANEOUS
  Filled 2024-12-21: qty 2
  Filled 2024-12-21 (×2): qty 1
  Filled 2024-12-21: qty 2

## 2024-12-21 MED ORDER — PRAVASTATIN SODIUM 20 MG PO TABS
20.0000 mg | ORAL_TABLET | Freq: Every day | ORAL | Status: DC
  Administered 2024-12-22: 20 mg via ORAL
  Filled 2024-12-21: qty 1

## 2024-12-21 MED ORDER — PANTOPRAZOLE SODIUM 40 MG IV SOLR
40.0000 mg | INTRAVENOUS | Status: DC
  Administered 2024-12-21 – 2024-12-22 (×2): 40 mg via INTRAVENOUS
  Filled 2024-12-21 (×2): qty 10

## 2024-12-21 MED ORDER — TRAZODONE HCL 50 MG PO TABS
75.0000 mg | ORAL_TABLET | Freq: Every day | ORAL | Status: DC
  Administered 2024-12-21 – 2024-12-22 (×2): 75 mg via ORAL
  Filled 2024-12-21 (×2): qty 2

## 2024-12-21 MED ORDER — ONDANSETRON HCL 4 MG/2ML IJ SOLN: 4.0000 mg | Freq: Four times a day (QID) | INTRAMUSCULAR | Status: DC | PRN

## 2024-12-21 MED ORDER — SODIUM CHLORIDE 0.9 % IV SOLN: 12.5000 mg | Freq: Four times a day (QID) | INTRAVENOUS | Status: DC | PRN

## 2024-12-21 MED ORDER — HYDRALAZINE HCL 20 MG/ML IJ SOLN
5.0000 mg | INTRAMUSCULAR | Status: DC | PRN
  Administered 2024-12-21 – 2024-12-22 (×2): 5 mg via INTRAVENOUS
  Filled 2024-12-21 (×2): qty 1

## 2024-12-21 MED ORDER — HALOPERIDOL LACTATE 5 MG/ML IJ SOLN: 0.5000 mg | Freq: Four times a day (QID) | INTRAMUSCULAR | Status: DC | PRN

## 2024-12-21 MED ORDER — VENLAFAXINE HCL ER 75 MG PO CP24
75.0000 mg | ORAL_CAPSULE | Freq: Every day | ORAL | Status: DC
  Administered 2024-12-22 – 2024-12-23 (×2): 75 mg via ORAL
  Filled 2024-12-21 (×2): qty 1

## 2024-12-21 MED ORDER — HYDROCODONE-ACETAMINOPHEN 5-325 MG PO TABS: 1.0000 | ORAL_TABLET | ORAL | Status: DC | PRN

## 2024-12-21 MED ORDER — ACETAMINOPHEN 325 MG PO TABS
650.0000 mg | ORAL_TABLET | Freq: Four times a day (QID) | ORAL | Status: DC | PRN
  Administered 2024-12-22: 650 mg via ORAL
  Filled 2024-12-21: qty 2

## 2024-12-21 MED ORDER — MORPHINE SULFATE (PF) 2 MG/ML IV SOLN: 2.0000 mg | INTRAVENOUS | Status: DC | PRN

## 2024-12-21 MED ORDER — ONDANSETRON HCL 4 MG/2ML IJ SOLN
4.0000 mg | Freq: Once | INTRAMUSCULAR | Status: AC
  Administered 2024-12-21: 4 mg via INTRAVENOUS
  Filled 2024-12-21: qty 2

## 2024-12-21 MED ORDER — ALBUTEROL SULFATE HFA 108 (90 BASE) MCG/ACT IN AERS: 2.0000 | INHALATION_SPRAY | RESPIRATORY_TRACT | Status: DC | PRN

## 2024-12-21 MED ORDER — ENOXAPARIN SODIUM 40 MG/0.4ML IJ SOSY
40.0000 mg | PREFILLED_SYRINGE | INTRAMUSCULAR | Status: DC
  Administered 2024-12-22 – 2024-12-23 (×2): 40 mg via SUBCUTANEOUS
  Filled 2024-12-21 (×2): qty 0.4

## 2024-12-21 MED ORDER — ACETAMINOPHEN 650 MG RE SUPP: 650.0000 mg | Freq: Four times a day (QID) | RECTAL | Status: DC | PRN

## 2024-12-21 MED ORDER — LORATADINE 10 MG PO TABS
10.0000 mg | ORAL_TABLET | Freq: Every day | ORAL | Status: DC
  Administered 2024-12-22 – 2024-12-23 (×2): 10 mg via ORAL
  Filled 2024-12-21 (×2): qty 1

## 2024-12-21 MED ORDER — ALBUTEROL SULFATE (2.5 MG/3ML) 0.083% IN NEBU: 2.5000 mg | INHALATION_SOLUTION | RESPIRATORY_TRACT | Status: DC | PRN

## 2024-12-21 MED ORDER — SODIUM CHLORIDE 0.9 % IV SOLN: INTRAVENOUS | Status: DC

## 2024-12-21 MED ORDER — SODIUM CHLORIDE 0.9 % IV BOLUS
1000.0000 mL | Freq: Once | INTRAVENOUS | Status: AC
  Administered 2024-12-21: 1000 mL via INTRAVENOUS

## 2024-12-21 MED ORDER — ONDANSETRON HCL 4 MG PO TABS: 4.0000 mg | ORAL_TABLET | Freq: Four times a day (QID) | ORAL | Status: DC | PRN

## 2024-12-21 NOTE — Assessment & Plan Note (Signed)
 In remission.  WBC 17,000 down from prior baseline

## 2024-12-21 NOTE — Assessment & Plan Note (Addendum)
 History of Schatzki's ring distal esophagus s/p dilatation 04/2024 History of severe erosive esophagitis(EGD 08/2024) --> Grade A esophagitis(EGD 11/2024) History of aspiration pneumonia CT abdomen and pelvis showing distended gallbladder but normal lipase and LFTs Will keep n.p.o. except for ice chips then advance as tolerated IV Protonix , IV antiemetics, IV hydration Monitor and correct electrolyte disturbance Had an improved EGD a month ago so we will hold off on GI consult at this time Aspiration precautions SLP consult for recommendations for swallow study/barium swallow/further evaluation with GI consult

## 2024-12-21 NOTE — ED Provider Notes (Signed)
 "  [facility] Provider Note    Event Date/Time   First MD Initiated Contact with Patient 12/21/24 1640     (approximate)   History   Emesis   HPI  Melissa Curtis is a 84 y.o. female with a history of type 2 diabetes, GERD, hypertension, and CLL who presents with nausea vomiting, acute onset last night, persistent course since then, somewhat bilious vomit.  She had a relatively normal bowel movement yesterday although she did have some diarrhea a few days ago.  She does not have any abdominal pain.  She denies any fever or chills.  She states her mouth feels very dry.  I reviewed the past medical records.  The patient's most recent outpatient encounter was with podiatry yesterday for follow-up of an ankle fracture.  She was seen by family medicine on 1/7 for follow-up of her hypertension and evaluation for her ankle fracture.   Physical Exam   Triage Vital Signs: ED Triage Vitals  Encounter Vitals Group     BP 12/21/24 1432 (!) 190/100     Girls Systolic BP Percentile --      Girls Diastolic BP Percentile --      Boys Systolic BP Percentile --      Boys Diastolic BP Percentile --      Pulse Rate 12/21/24 1432 88     Resp 12/21/24 1431 18     Temp 12/21/24 1431 98 F (36.7 C)     Temp src --      SpO2 12/21/24 1432 97 %     Weight 12/21/24 1432 120 lb (54.4 kg)     Height 12/21/24 1432 5' 2 (1.575 m)     Head Circumference --      Peak Flow --      Pain Score 12/21/24 1432 0     Pain Loc --      Pain Education --      Exclude from Growth Chart --     Most recent vital signs: Vitals:   12/21/24 2140 12/21/24 2152  BP: (!) 179/71 (!) 172/70  Pulse:  78  Resp:  18  Temp:  98 F (36.7 C)  SpO2:  98%    General: Alert, somewhat weak appearing, no distress.  CV:  Good peripheral perfusion.  Resp:  Normal effort.  Abd:  Soft with no focal tenderness.  No distention.  Other:  Very dry mucous membranes.   ED Results / Procedures /  Treatments   Labs (all labs ordered are listed, but only abnormal results are displayed) Labs Reviewed  COMPREHENSIVE METABOLIC PANEL WITH GFR - Abnormal; Notable for the following components:      Result Value   Glucose, Bld 271 (*)    All other components within normal limits  CBC - Abnormal; Notable for the following components:   WBC 17.6 (*)    All other components within normal limits  URINALYSIS, ROUTINE W REFLEX MICROSCOPIC - Abnormal; Notable for the following components:   Color, Urine YELLOW (*)    APPearance CLEAR (*)    Glucose, UA >=500 (*)    Protein, ur 30 (*)    All other components within normal limits  GLUCOSE, CAPILLARY - Abnormal; Notable for the following components:   Glucose-Capillary 187 (*)    All other components within normal limits  LIPASE, BLOOD  CBC  BASIC METABOLIC PANEL WITH GFR     EKG  ED ECG REPORT I, Waylon Cassis, the attending  physician, personally viewed and interpreted this ECG.  Date: 12/21/2024 EKG Time: 1844 Rate: 85 Rhythm: normal sinus rhythm QRS Axis: normal Intervals: normal ST/T Wave abnormalities: Nonspecific ST abnormality Narrative Interpretation: no evidence of acute ischemia    RADIOLOGY  CT abdomen/pelvis: I independently viewed and interpreted the images; there are no dilated bowel loops or any free air or free fluid.  Radiology report indicates the following:  IMPRESSION:  1. No CT evidence of acute abdominopelvic abnormality.  2. Moderately distended gallbladder. This could be further evaluated with right  upper quadrant ultrasound if felt clinically indicated.  3. Moderate stool burden throughout the colon.     PROCEDURES:  Critical Care performed: No  Procedures   MEDICATIONS ORDERED IN ED: Medications  hydrALAZINE  (APRESOLINE ) injection 5 mg (5 mg Intravenous Given 12/21/24 2140)  promethazine (PHENERGAN) 12.5 mg in sodium chloride  0.9 % 50 mL IVPB (has no administration in time range)   pantoprazole  (PROTONIX ) injection 40 mg (40 mg Intravenous Given 12/21/24 2139)  haloperidol  lactate (HALDOL ) injection 0.5 mg (has no administration in time range)  pravastatin  (PRAVACHOL ) tablet 20 mg (has no administration in time range)  traZODone  (DESYREL ) tablet 75 mg (75 mg Oral Given 12/21/24 2247)  venlafaxine  XR (EFFEXOR -XR) 24 hr capsule 75 mg (has no administration in time range)  loratadine  (CLARITIN ) tablet 10 mg (has no administration in time range)  enoxaparin  (LOVENOX ) injection 40 mg (has no administration in time range)  acetaminophen  (TYLENOL ) tablet 650 mg (has no administration in time range)    Or  acetaminophen  (TYLENOL ) suppository 650 mg (has no administration in time range)  ondansetron  (ZOFRAN ) tablet 4 mg (has no administration in time range)    Or  ondansetron  (ZOFRAN ) injection 4 mg (has no administration in time range)  insulin  aspart (novoLOG ) injection 0-9 Units (2 Units Subcutaneous Given 12/21/24 2248)  0.9 %  sodium chloride  infusion ( Intravenous New Bag/Given 12/21/24 2223)  HYDROcodone -acetaminophen  (NORCO/VICODIN) 5-325 MG per tablet 1-2 tablet (has no administration in time range)  morphine  (PF) 2 MG/ML injection 2 mg (has no administration in time range)  albuterol  (PROVENTIL ) (2.5 MG/3ML) 0.083% nebulizer solution 2.5 mg (has no administration in time range)  sodium chloride  0.9 % bolus 1,000 mL (1,000 mLs Intravenous New Bag/Given 12/21/24 1720)  ondansetron  (ZOFRAN ) injection 4 mg (4 mg Intravenous Given 12/21/24 1718)  enalaprilat  (VASOTEC) injection 0.625 mg (0.625 mg Intravenous Given 12/21/24 2250)     IMPRESSION / MDM / ASSESSMENT AND PLAN / ED COURSE  I reviewed the triage vital signs and the nursing notes.  84 year old female with PMH as noted above presents with persistent nausea and vomiting since last night with no significant associated abdominal pain.  On exam the patient is hypertensive.  Other vital signs are normal.  Abdomen is soft  and nontender.  She appears quite dehydrated.  Differential diagnosis includes, but is not limited to, viral gastroenteritis, foodborne illness, gastroparesis, gastritis, less likely bowel obstruction or other structural etiology.  CBC shows leukocytosis but this appears chronic for the patient and may be related to her CLL.  CMP and lipase are unremarkable.  We will obtain a CT for further evaluation and give fluids and antiemetics.  Patient's presentation is most consistent with acute complicated illness / injury requiring diagnostic workup.  ----------------------------------------- 9:19 PM on 12/21/2024 -----------------------------------------  The CT is negative for acute findings.  There is a distended gallbladder but the patient is not having a right upper quadrant pain or symptoms of cholecystitis.  Her symptoms have improved somewhat although she still reports persistent nausea and has not really been tolerating p.o.  Therefore she will need admission for further management.  I consulted Dr. Cleatus from the hospitalist service; based on our discussion she agrees to evaluate the patient for admission.  FINAL CLINICAL IMPRESSION(S) / ED DIAGNOSES   Final diagnoses:  Nausea and vomiting, unspecified vomiting type     Rx / DC Orders   ED Discharge Orders     None        Note:  This document was prepared using Dragon voice recognition software and may include unintentional dictation errors.    Jacolyn Pae, MD 12/21/24 2312  "

## 2024-12-21 NOTE — Assessment & Plan Note (Addendum)
 Mild late onset Alzheimer's Continue venlafaxine  and trazodone  and Xanax if tolerating Delirium precautions As needed low-dose Haldol  if needed for agitation

## 2024-12-21 NOTE — Assessment & Plan Note (Addendum)
 BP elevated likely due to intractable vomiting and inability to tolerate meds IV hydralazine  every 4 as needed One-time dose of IV Vasotec to assist with control Resume home meds once tolerating

## 2024-12-21 NOTE — ED Triage Notes (Signed)
 Pt to ED for emesis started last night. Denies diarrhea, fevers, abd pain.

## 2024-12-21 NOTE — H&P (Signed)
 " History and Physical    Patient: Melissa Curtis FMW:[id number] DOB: 1941/02/04 DOA: 12/21/2024 DOS: the patient was seen and examined on 12/21/2024 PCP: Zachary Idelia LABOR, MD  Patient coming from: Home  Chief Complaint:  Chief Complaint  Patient presents with   Emesis    HPI: Melissa Curtis is a 84 y.o. 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  She denies abdominal pain, fever or chills and has no cough or chest pain or change in bowel habits. In the ED BP 190/100 but otherwise normal vitals.  Labs notable for glucose 271,WBC 17.6, down from baseline for his CLL Lipase and LFTs WNL EKG showed sinus at 85 with occasional PVCs CT abdomen and pelvis nonacute, shows moderately distended gallbladder  Patient treated with an NS bolus and given Zofran   Admission requested    Past Medical History:  Diagnosis Date   Breast cancer (HCC) 1983   had double mastectomy   Cataracts, bilateral    CLL (chronic lymphocytic leukemia) (HCC)    Diabetes (HCC)    GERD (gastroesophageal reflux disease)    Hypertension    Leukocytosis 01/2014   Osteopenia    Past Surgical History:  Procedure Laterality Date   APPENDECTOMY     AUGMENTATION MAMMAPLASTY Bilateral 1983   COLONOSCOPY     by Dr. Viktoria over 5 years ago   double mastectomy with breast rescontruction  1983   ESOPHAGOGASTRODUODENOSCOPY N/A 06/06/2021   Procedure: ESOPHAGOGASTRODUODENOSCOPY (EGD);  Surgeon: Dessa Reyes ORN, MD;  Location: [facility] ENDOSCOPY;  Service:  Endoscopy;  Laterality: N/A;   ESOPHAGOGASTRODUODENOSCOPY N/A 04/19/2024   Procedure: EGD (ESOPHAGOGASTRODUODENOSCOPY);  Surgeon: Maryruth Ole DASEN, MD;  Location: [facility] ENDOSCOPY;  Service: Endoscopy;  Laterality: N/A;  DM   ESOPHAGOGASTRODUODENOSCOPY N/A 08/11/2024   Procedure: EGD (ESOPHAGOGASTRODUODENOSCOPY);  Surgeon: Unk Corinn Skiff, MD;  Location: [facility] ENDOSCOPY;  Service: Gastroenterology;  Laterality: N/A;   ESOPHAGOGASTRODUODENOSCOPY N/A 11/10/2024   Procedure: EGD (ESOPHAGOGASTRODUODENOSCOPY);  Surgeon: Unk Corinn Skiff, MD;  Location: [facility] ENDOSCOPY;  Service: Gastroenterology;  Laterality: N/A;  DM   MASTECTOMY Bilateral 1983   right breast ca mastecomy only no chemo no rad bilat implants    Social History:  reports that she quit smoking about 29 years ago. Her smoking use included cigarettes. She has never used smokeless tobacco. She reports that she does not drink alcohol and does not use drugs.  Allergies[1]  Family History  Problem Relation Age of Onset   Leukemia Father 62   Non-Hodgkin's lymphoma Mother 73   Colon cancer Paternal Grandmother        age unknown   Breast cancer Cousin        paternal cancer    Prior to Admission medications  Medication Sig Start Date End Date Taking? Authorizing Provider  Accu-Chek Softclix Lancets lancets as directed Use as instructed. 04/13/21   [provider]  albuterol  (VENTOLIN  HFA) 108 (90 Base) MCG/ACT inhaler Inhale 2 puffs into the lungs every 4 (four) hours as needed. 07/31/24 07/31/25  [provider]  aspirin  EC 81 MG tablet Take 1 tablet by mouth daily. Patient not taking:  Reported on 11/10/2024    [provider]  Blood Glucose Monitoring Suppl (GLUCOCOM BLOOD GLUCOSE MONITOR) DEVI See admin instructions. 02/21/22   [provider]  Calcium Carbonate-Vitamin D 600-400 MG-UNIT tablet Take 1 tablet by mouth 2 (two) times daily. 01/15/15 03/24/38  [provider]  cyanocobalamin  (VITAMIN B12) 1000 MCG tablet Take 1,000 mcg by mouth daily.    [provider]  fexofenadine (ALLEGRA) 180 MG tablet Take 180 mg by mouth daily as needed. 03/05/17 03/24/38  [provider]  fluticasone  (FLONASE ) 50 MCG/ACT nasal spray Place 1 spray into both nostrils daily. 09/07/15   [provider]  furosemide  (LASIX ) 20 MG tablet Take 1 tablet (20 mg total) by mouth daily. 07/28/24   Jerelene Critchley, MD  glucose blood test strip  11/08/14   [provider]  mometasone (NASONEX) 50 MCG/ACT nasal spray Place 2 sprays into the nose. 01/01/24 12/31/24  [provider]  montelukast  (SINGULAIR ) 10 MG tablet Take 10 mg by mouth daily. 05/14/24   [provider]  Multiple Vitamins-Minerals (MULTIVITAMIN GUMMIES ADULT PO) Take 1 each by mouth daily.    [provider]  ondansetron  (ZOFRAN -ODT) 4 MG disintegrating tablet Take 1 tablet (4 mg total) by mouth every 8 (eight) hours as needed for nausea or vomiting. 08/12/24   Wouk, Devaughn Sayres, MD  pantoprazole  (PROTONIX ) 40 MG tablet Take 1 tablet (40 mg total) by mouth 2 (two) times daily before a meal. 08/12/24 08/12/25  Wouk, Devaughn Sayres, MD  pravastatin  (PRAVACHOL ) 20 MG tablet Take 20 mg by mouth daily.    [provider]  Spacer/Aero-Holding Chambers (EASIVENT) inhaler See admin instructions. 07/31/24 07/31/25  [provider]  sucralfate  (CARAFATE ) 1 g tablet Take 1 tablet (1 g total) by mouth every 8 (eight) hours as needed. 08/12/24 08/12/25  Wouk, Devaughn Sayres, MD  traZODone  (DESYREL ) 100 MG tablet Take 1 tablet by mouth at bedtime. 03/04/24 03/04/25  [provider]  venlafaxine  XR (EFFEXOR -XR) 75 MG 24 hr capsule Take 75 mg by mouth daily with breakfast.    [provider]    Physical Exam: Vitals:   12/21/24 1431 12/21/24 1432 12/21/24 1913 12/21/24 2135  BP:  (!) 190/100 (!) 192/88 (!) 179/71  Pulse:  88 99 77  Resp: 18  18 18   Temp: 98 F (36.7 C)  98.7  F (37.1 C) 98 F (36.7 C)  TempSrc:   Oral Oral  SpO2:  97% 97% 98%  Weight:  54.4 kg    Height:  5' 2 (1.575 m)     Physical Exam Vitals and nursing note reviewed.  Constitutional:      General: She is not in acute distress. HENT:     Head: Normocephalic and atraumatic.  Cardiovascular:     Rate and Rhythm: Normal rate and regular rhythm.     Heart sounds: Normal heart sounds.  Pulmonary:     Effort: Pulmonary effort is normal.     Breath sounds: Normal breath sounds.  Abdominal:     Palpations: Abdomen is soft.     Tenderness: There is no abdominal tenderness.  Neurological:     Mental Status: She is disoriented.     Labs on Admission: I have personally reviewed following labs and imaging studies  CBC: Recent Labs  Lab 12/21/24 1435  WBC 17.6*  HGB 15.0  HCT 44.7  MCV 96.8  PLT 181   Basic Metabolic Panel: Recent Labs  Lab 12/21/24 1435  NA 141  K 4.0  CL 99  CO2 27  GLUCOSE 271*  BUN 18  CREATININE 0.70  CALCIUM 9.9   GFR: Estimated Creatinine Clearance: 41.4 mL/min (by C-G formula based on SCr of 0.7 mg/dL). Liver Function Tests: Recent Labs  Lab 12/21/24 1435  AST 16  ALT 8  ALKPHOS 76  BILITOT 1.0  PROT 6.9  ALBUMIN 4.8   Recent Labs  Lab 12/21/24 1435  LIPASE 23   No results for input(s): AMMONIA in the last 168 hours. Coagulation Profile: No results for input(s): INR, PROTIME in the last 168 hours. Cardiac Enzymes: No results for input(s): CKTOTAL, CKMB, CKMBINDEX, TROPONINI in the last 168 hours. BNP (last 3 results) No results for input(s): PROBNP in the last 8760 hours. HbA1C: No results for input(s): HGBA1C in the last 72 hours. CBG: No results for input(s): GLUCAP in the last 168 hours. Lipid Profile: No results for input(s): CHOL, HDL, LDLCALC, TRIG, CHOLHDL, LDLDIRECT in the last 72 hours. Thyroid  Function Tests: No results for input(s): TSH, T4TOTAL, FREET4, T3FREE,  THYROIDAB in the last 72 hours. Anemia Panel: No results for input(s): VITAMINB12, FOLATE, FERRITIN, TIBC, IRON, RETICCTPCT in the last 72 hours. Urine analysis:    Component Value Date/Time   COLORURINE YELLOW (A) 12/21/2024 1435   APPEARANCEUR CLEAR (A) 12/21/2024 1435   LABSPEC 1.021 12/21/2024 1435   PHURINE 5.0 12/21/2024 1435   GLUCOSEU >=500 (A) 12/21/2024 1435   HGBUR NEGATIVE 12/21/2024 1435   BILIRUBINUR NEGATIVE 12/21/2024 1435   KETONESUR NEGATIVE 12/21/2024 1435   PROTEINUR 30 (A) 12/21/2024 1435   NITRITE NEGATIVE 12/21/2024 1435   LEUKOCYTESUR NEGATIVE 12/21/2024 1435    Radiological Exams on Admission: CT ABDOMEN PELVIS WO CONTRAST Result Date: 12/21/2024 EXAM: CT Abdomen and Pelvis Without Contrast 12/21/2024 06:42:10 PM TECHNIQUE: CT of the abdomen and pelvis was performed without the administration of intravenous contrast. Multiplanar reformatted images are provided for review. Automated exposure control, iterative reconstruction, and/or weight-based adjustment of the mA/kV was utilized to reduce the radiation dose to as low as reasonably achievable. COMPARISON: 08/11/2024 CLINICAL HISTORY: Abdominal pain, acute, nonlocalized; Also with intractable vomiting FINDINGS: LOWER CHEST: No acute abnormality. LIVER: The liver is unremarkable. GALLBLADDER AND BILE DUCTS: Gallbladder is moderately distended. No biliary ductal dilatation. SPLEEN: No acute abnormality. PANCREAS: No acute abnormality. ADRENAL GLANDS: No acute abnormality. KIDNEYS, URETERS AND BLADDER: No stones in the kidneys or ureters. No hydronephrosis. No perinephric or periureteral stranding. Urinary bladder is unremarkable. GI AND BOWEL: Stomach demonstrates no acute abnormality. There is no bowel obstruction. Left colonic diverticulosis. Moderate stool burden throughout the colon. PERITONEUM AND RETROPERITONEUM: No ascites. No free air. VASCULATURE: Diffuse aortic atherosclerosis. Aorta is normal in  caliber. LYMPH NODES: No lymphadenopathy. REPRODUCTIVE ORGANS: No acute abnormality. BONES AND SOFT TISSUES: No acute osseous abnormality. No focal soft tissue abnormality. IMPRESSION: 1. No CT evidence of acute abdominopelvic abnormality. 2. Moderately distended gallbladder. This could be further evaluated with right upper quadrant ultrasound if felt clinically indicated. 3. Moderate stool burden throughout the colon. Electronically signed by: Franky Crease MD 12/21/2024 06:57 PM EST RP Workstation: [id number]   Data Reviewed for HPI: Relevant notes from primary care and specialist visits, past discharge summaries as available in EHR, including Care Everywhere. Prior diagnostic testing as pertinent to current admission diagnoses Updated medications and problem lists for reconciliation ED course, including vitals, labs, imaging, treatment and response to treatment Triage notes, nursing and pharmacy notes and ED provider's notes Notable results as noted above in HPI  Assessment and Plan: * Intractable nausea and vomiting, recurrent History of Schatzki's ring distal esophagus s/p dilatation 04/2024 History of severe erosive esophagitis(EGD 08/2024) --> Grade A esophagitis(EGD 11/2024) History of aspiration pneumonia CT abdomen and pelvis showing distended gallbladder but normal lipase and LFTs Will keep n.p.o. except for ice chips then advance as tolerated IV Protonix , IV antiemetics, IV hydration Monitor and correct electrolyte disturbance Had an improved EGD a month ago so we will hold off on GI consult at this time Aspiration precautions SLP consult for recommendations for swallow study/barium swallow/further evaluation with GI consult    Hypertensive urgency BP elevated likely due to intractable vomiting and inability to tolerate meds IV hydralazine  every 4 as needed One-time dose of IV Vasotec to assist with control Resume home meds once tolerating  Cerebral amyloid angiopathy  (HCC) Mild late onset Alzheimer's Continue venlafaxine  and trazodone  and Xanax if tolerating Delirium precautions As needed low-dose Haldol  if needed for agitation  Chronic lymphocytic leukemia (HCC) In remission.  WBC 17,000 down from prior baseline     DVT prophylaxis: Lovenox   Consults: none  Advance Care Planning:   Code Status: Prior   Family Communication: none  Disposition Plan: Back to previous home environment  Severity of Illness: The appropriate patient status for this patient is OBSERVATION. Observation status is judged to be reasonable and necessary in order to provide the required intensity of service to ensure the patient's safety. The patient's presenting symptoms, physical exam findings, and initial radiographic and laboratory data in the context of their medical condition is felt to place them at decreased risk for further clinical deterioration. Furthermore, it is anticipated that the patient will be medically stable for discharge from the hospital within 2 midnights of admission.   Author: Delayne LULLA Solian, MD 12/21/2024 9:41 PM  For on call review [email or web address].      [1]  Allergies Allergen Reactions   Statins Other (See Comments)    Could not tolerate Liptor due to aching legs and joints; However, she has been able to tolerate pravachol    Iodine    Shellfish Allergy    Sulfa Antibiotics    "

## 2024-12-22 DIAGNOSIS — K224 Dyskinesia of esophagus: Secondary | ICD-10-CM

## 2024-12-22 DIAGNOSIS — R112 Nausea with vomiting, unspecified: Secondary | ICD-10-CM | POA: Diagnosis not present

## 2024-12-22 LAB — CBC
HCT: 39.3 % (ref 36.0–46.0)
Hemoglobin: 13.2 g/dL (ref 12.0–15.0)
MCH: 32.9 pg (ref 26.0–34.0)
MCHC: 33.6 g/dL (ref 30.0–36.0)
MCV: 98 fL (ref 80.0–100.0)
Platelets: 151 K/uL (ref 150–400)
RBC: 4.01 MIL/uL (ref 3.87–5.11)
RDW: 13.2 % (ref 11.5–15.5)
WBC: 16.9 K/uL — ABNORMAL HIGH (ref 4.0–10.5)
nRBC: 0 % (ref 0.0–0.2)

## 2024-12-22 LAB — GLUCOSE, CAPILLARY
Glucose-Capillary: 112 mg/dL — ABNORMAL HIGH (ref 70–99)
Glucose-Capillary: 126 mg/dL — ABNORMAL HIGH (ref 70–99)
Glucose-Capillary: 130 mg/dL — ABNORMAL HIGH (ref 70–99)
Glucose-Capillary: 132 mg/dL — ABNORMAL HIGH (ref 70–99)
Glucose-Capillary: 141 mg/dL — ABNORMAL HIGH (ref 70–99)
Glucose-Capillary: 157 mg/dL — ABNORMAL HIGH (ref 70–99)
Glucose-Capillary: 162 mg/dL — ABNORMAL HIGH (ref 70–99)

## 2024-12-22 LAB — BASIC METABOLIC PANEL WITH GFR
Anion gap: 9 (ref 5–15)
BUN: 17 mg/dL (ref 8–23)
CO2: 29 mmol/L (ref 22–32)
Calcium: 9.4 mg/dL (ref 8.9–10.3)
Chloride: 100 mmol/L (ref 98–111)
Creatinine, Ser: 0.61 mg/dL (ref 0.44–1.00)
GFR, Estimated: >60 mL/min
Glucose, Bld: 150 mg/dL — ABNORMAL HIGH (ref 70–99)
Potassium: 3.7 mmol/L (ref 3.5–5.1)
Sodium: 139 mmol/L (ref 135–145)

## 2024-12-22 MED ORDER — SUCRALFATE 1 G PO TABS: 1.0000 g | ORAL_TABLET | Freq: Three times a day (TID) | ORAL | Status: DC | PRN

## 2024-12-22 MED ORDER — PROCHLORPERAZINE EDISYLATE 10 MG/2ML IJ SOLN: 10.0000 mg | Freq: Four times a day (QID) | INTRAMUSCULAR | Status: DC | PRN

## 2024-12-22 MED ORDER — MONTELUKAST SODIUM 10 MG PO TABS
10.0000 mg | ORAL_TABLET | Freq: Every day | ORAL | Status: DC
  Administered 2024-12-22 – 2024-12-23 (×2): 10 mg via ORAL
  Filled 2024-12-22 (×2): qty 1

## 2024-12-22 MED ORDER — HYDRALAZINE HCL 20 MG/ML IJ SOLN
10.0000 mg | Freq: Four times a day (QID) | INTRAMUSCULAR | Status: DC | PRN
  Administered 2024-12-22 – 2024-12-23 (×2): 10 mg via INTRAVENOUS
  Filled 2024-12-22 (×2): qty 1

## 2024-12-22 MED ORDER — PROCHLORPERAZINE MALEATE 10 MG PO TABS: 10.0000 mg | ORAL_TABLET | Freq: Four times a day (QID) | ORAL | Status: DC | PRN

## 2024-12-22 MED ORDER — INSULIN ASPART 100 UNIT/ML IJ SOLN
0.0000 [IU] | Freq: Three times a day (TID) | INTRAMUSCULAR | Status: DC
  Administered 2024-12-23 (×2): 1 [IU] via SUBCUTANEOUS
  Filled 2024-12-22 (×2): qty 1

## 2024-12-22 NOTE — Progress Notes (Signed)
 " PROGRESS NOTE    Melissa Curtis  FMW:[id number] DOB: [birth year] DOA: 12/21/2024 PCP: Zachary Idelia LABOR, MD  Chief Complaint  Patient presents with   Emesis    Hospital Course:  Melissa Curtis is a 84 y.o. 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   She denies abdominal pain, fever or chills and has no cough or chest pain or change in bowel habits. Patient admitted for further management recurrent intractable nausea and vomiting  Subjective: Patient was examined at the bedside, new to me today. Granddaughter Melissa Curtis present at the bedside.  Discussed updates and answered all question Resume diet per SLP eval Consulted GI due to concerns for esophageal dysmotility and recurrent symptoms   Objective: Vitals:   12/21/24 2339 12/22/24 0350 12/22/24 0631 12/22/24 0846  BP: (!) 174/73 (!) 172/77 (!) 156/72 (!) 149/63  Pulse: 92 88 82 67  Resp: 16 16  17   Temp: 98.4 F (36.9 C) 98.4 F (36.9 C)  98 F (36.7 C)  TempSrc:      SpO2: 97% 96% 97% 95%  Weight:      Height:        Intake/Output Summary (Last 24 hours) at 12/22/2024 0853 Last data filed at 12/22/2024 0532 Gross per 24 hour  Intake 464.1 ml  Output 250 ml  Net 214.1 ml   Filed Weights   12/21/24 1432  Weight: 54.4 kg    Examination: General exam: Appears calm and comfortable, NAD Respiratory system: No work of breathing, symmetric chest wall expansion Cardiovascular system: S1 & S2 heard, RRR.  Gastrointestinal system: Abdomen is nondistended,  soft and nontender.  Neuro: Mild confusion, no focal deficits Extremities: Symmetric, expected ROM Skin: No rashes, lesions  Assessment & Plan:  Intractable nausea and vomiting, recurrent History of Schatzki's ring distal esophagus s/p dilatation 04/2024 History of severe erosive esophagitis(EGD 08/2024) --> Grade A esophagitis(EGD 11/2024) History of aspiration pneumonia CT abdomen and pelvis showing distended gallbladder but normal lipase and LFTs Seen by SLP, no oropharyngeal phase swallowing issues. Consulted GI due to concern for esophageal phase dysmotility Trial of regular diet with reflux precaution IV Protonix , Sucralfate  prn, antiemetics prn Discontinue IV fluids as patient able to tolerate some p.o. 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  WBC down from prior baseline  Type 2 DM SSI  Generalized weakness PT/OT eval    DVT prophylaxis: Lovenox  SQ   Code Status: Limited: Do not attempt resuscitation (DNR) -DNR-LIMITED -Do Not Intubate/DNI  Disposition:  TBD  Consultants:  Gastroenterology  Procedures:  None  Antimicrobials:  Anti-infectives (From admission, onward)    None       Data Reviewed: I have personally reviewed following labs and imaging studies CBC: Recent Labs  Lab 12/21/24 1435 12/22/24  0730  WBC 17.6* 16.9*  HGB 15.0 13.2  HCT 44.7 39.3  MCV 96.8 98.0  PLT 181 151   Basic Metabolic Panel: Recent Labs  Lab 12/21/24 1435 12/22/24 0730  NA 141 139  K 4.0 3.7  CL 99 100  CO2 27 29  GLUCOSE 271* 150*  BUN  18 17  CREATININE 0.70 0.61  CALCIUM 9.9 9.4   GFR: Estimated Creatinine Clearance: 41.4 mL/min (by C-G formula based on SCr of 0.61 mg/dL). Liver Function Tests: Recent Labs  Lab 12/21/24 1435  AST 16  ALT 8  ALKPHOS 76  BILITOT 1.0  PROT 6.9  ALBUMIN 4.8   CBG: Recent Labs  Lab 12/21/24 2221 12/22/24 0027 12/22/24 0348 12/22/24 0848  GLUCAP 187* 157* 132* 126*    No results found for this or any previous visit (from the past 240 hours).   Radiology Studies: CT ABDOMEN PELVIS WO CONTRAST Result Date: 12/21/2024 EXAM: CT Abdomen and Pelvis Without Contrast 12/21/2024 06:42:10 PM TECHNIQUE: CT of the abdomen and pelvis was performed without the administration of intravenous contrast. Multiplanar reformatted images are provided for review. Automated exposure control, iterative reconstruction, and/or weight-based adjustment of the mA/kV was utilized to reduce the radiation dose to as low as reasonably achievable. COMPARISON: 08/11/2024 CLINICAL HISTORY: Abdominal pain, acute, nonlocalized; Also with intractable vomiting FINDINGS: LOWER CHEST: No acute abnormality. LIVER: The liver is unremarkable. GALLBLADDER AND BILE DUCTS: Gallbladder is moderately distended. No biliary ductal dilatation. SPLEEN: No acute abnormality. PANCREAS: No acute abnormality. ADRENAL GLANDS: No acute abnormality. KIDNEYS, URETERS AND BLADDER: No stones in the kidneys or ureters. No hydronephrosis. No perinephric or periureteral stranding. Urinary bladder is unremarkable. GI AND BOWEL: Stomach demonstrates no acute abnormality. There is no bowel obstruction. Left colonic diverticulosis. Moderate stool burden throughout the colon. PERITONEUM AND RETROPERITONEUM: No ascites. No free air. VASCULATURE: Diffuse aortic atherosclerosis. Aorta is normal in caliber. LYMPH NODES: No lymphadenopathy. REPRODUCTIVE ORGANS: No acute abnormality. BONES AND SOFT TISSUES: No acute osseous abnormality. No focal soft tissue  abnormality. IMPRESSION: 1. No CT evidence of acute abdominopelvic abnormality. 2. Moderately distended gallbladder. This could be further evaluated with right upper quadrant ultrasound if felt clinically indicated. 3. Moderate stool burden throughout the colon. Electronically signed by: Franky Crease MD 12/21/2024 06:57 PM EST RP Workstation: [id number]    Scheduled Meds:  enoxaparin  (LOVENOX ) injection  40 mg Subcutaneous Q24H   insulin  aspart  0-9 Units Subcutaneous Q4H   loratadine   10 mg Oral Daily   pantoprazole  (PROTONIX ) IV  40 mg Intravenous Q24H   pravastatin   20 mg Oral q1800   traZODone   75 mg Oral QHS   venlafaxine  XR  75 mg Oral Q breakfast   Continuous Infusions:  sodium chloride  100 mL/hr at 12/22/24 0532   promethazine (PHENERGAN) injection (IM or IVPB)       LOS: 0 days  MDM: Patient is high risk for one or more organ failure.  They necessitate ongoing hospitalization for continued IV therapies and subsequent lab monitoring. Total time spent interpreting labs and vitals, reviewing the medical record, coordinating care amongst consultants and care team members, directly assessing and discussing care with the patient and/or family: 55 min Laree Lock, MD [facility]  To contact the attending physician between 7A-7P please use Epic Chat. To contact the covering physician during after hours 7P-7A, please review Amion.  12/22/2024, 8:53 AM   *This document has been created with the assistance of dictation software. Please excuse typographical errors. *   "

## 2024-12-22 NOTE — Plan of Care (Signed)

## 2024-12-22 NOTE — Evaluation (Signed)
 Clinical/Bedside Swallow Evaluation Patient Details  Name: CASILDA PICKERILL MRN: [id number] Date of Birth: [birth year]  Today's Date: 12/22/2024 Time: SLP Start Time (ACUTE ONLY): 1345 SLP Stop Time (ACUTE ONLY): 1435 SLP Time Calculation (min) (ACUTE ONLY): 50 min  Past Medical History:  Past Medical History:  Diagnosis Date   Breast cancer (HCC) 1983   had double mastectomy   Cataracts, bilateral    CLL (chronic lymphocytic leukemia) (HCC)    Diabetes (HCC)    GERD (gastroesophageal reflux disease)    Hypertension    Leukocytosis 01/2014   Osteopenia    Past Surgical History:  Past Surgical History:  Procedure Laterality Date   APPENDECTOMY     AUGMENTATION MAMMAPLASTY Bilateral 1983   COLONOSCOPY     by Dr. Viktoria over 5 years ago   double mastectomy with breast rescontruction  1983   ESOPHAGOGASTRODUODENOSCOPY N/A 06/06/2021   Procedure: ESOPHAGOGASTRODUODENOSCOPY (EGD);  Surgeon: Dessa Reyes ORN, MD;  Location: [facility] ENDOSCOPY;  Service: Endoscopy;  Laterality: N/A;   ESOPHAGOGASTRODUODENOSCOPY N/A 04/19/2024   Procedure: EGD (ESOPHAGOGASTRODUODENOSCOPY);  Surgeon: Maryruth Ole DASEN, MD;  Location: [facility] ENDOSCOPY;  Service: Endoscopy;  Laterality: N/A;  DM   ESOPHAGOGASTRODUODENOSCOPY N/A 08/11/2024   Procedure: EGD (ESOPHAGOGASTRODUODENOSCOPY);  Surgeon: Unk Corinn Skiff, MD;  Location: [facility] ENDOSCOPY;  Service: Gastroenterology;  Laterality: N/A;   ESOPHAGOGASTRODUODENOSCOPY N/A 11/10/2024   Procedure: EGD (ESOPHAGOGASTRODUODENOSCOPY);  Surgeon: Unk Corinn Skiff, MD;  Location: [facility] ENDOSCOPY;  Service: Gastroenterology;  Laterality: N/A;  DM   MASTECTOMY Bilateral 1983   right breast ca mastecomy only no chemo no rad bilat implants    HPI:  Pt is a 84 y.o. female with medical history significant for cerebral amyloid angiopathy, mild Alzheimer's Dementia, Falls, pneumonia w/ concern for REFLUX aspiration, Schatzki's ring dilated in May 2025, hiatal hernia, CLL in  remission, DM, HTN, whose last hospitalization was in September 2025 with severe erosive esophagitis presenting with epigastric pain and vomiting, discharged on sucralfate , and lifelong PPI, with improvement on repeat EGD 11/2024 (grade A esophagitis, and a previous grade D noted on EGD) being admitted with a 1 day history of intractable vomiting and inability to hold anything down.  She denies abdominal pain, fever or chills and has no cough.   CTAbd: LOWER CHEST:  No acute abnormality.     Assessment / Plan / Recommendation  Clinical Impression   Pt seen for BSE today. Pt awake, verbal and followed instructions. She endorsed s/s of Esophageal phase Dysmotility including N/V episodes recently. Pt has Known, long-standing h/o Esophageal phase Dysmotility per chart notes- see EGD reports. MD updated. Mild Dementia noted per chart. Pt on RA, afebrile.    OF NOTE: Pt strongly endorses s/s of REFLUX at home; she reports episodes of having to spit up/out increased phlegm as well as Vomiting. Noted her h/o Esophageal phase Deficits/Dysmotility including Esophagitis, Schatzki's Ring, and Vomiting. Pt was just recently see by GI w/ Dilation reported then by pt. She stated she has had these Vomiting episodes before.     Pt appears to present w/ functional oropharyngeal phase swallowing w/ No overt oropharyngeal phase dysphagia appreciated during oral intake of trials; No neuromuscular swallowing deficits appreciated. Pt appears at reduced risk for aspiration from an oropharyngeal phase standpoint when following general aspiration precautions. HOWEVER, pt has a baseline presentation of REFLUX and episodes of REFLUX behavior and Regurgitation; h/o Esophageal phase Deficits/Dysmotility.  ANY Esophageal Dysmotility or Regurgitation of Reflux material can increase risk for aspiration of the Reflux material during  Retrograde flow thus impact Voicing and Pulmonary status.     Pt sat upright in bed and consumed several  trials of thin liquids Via Cup/Straw, purees, and moistened solid foods w/ No overt clinical s/s of aspiration noted; clear vocal quality b/t trials, no decline in pulmonary status, no cough, no decline in O2 sats(98%). Oral phase appeared [facility] for bolus management and timely A-P transfer/clearing of material. Mastication appropriate for boluses. OM exam was [facility] for oral clearing; lingual/labial movements. No unilateral weakness. Speech clear.    Recommend a Regular>Mech Soft diet (Cut/Chopped meats, well-moistened foods) w/ thin liquids - less straw use d/t air swallowed and No carbonated drinks during meals. General aspiration precautions. REFLUX Precautions to include Rest Breaks during meals/oral intake to allow for Esophageal clearing. HOB elevated post meals and at night when sleeping.  Note pt is on a PPI.   Recommend pt f/u w/ GI for assessment/management of REFLUX and Esophageal phase Dysmotility/Deficits and tx as indicated. Discussion and handouts given on REFLUX, behaviors to manage REFLUX, and foods/diet/prep.  MD to reconsult ST services if any new needs while admitted. NSG updated. Pt appreciative of Education information. Precautions posted in chart, room.    SLP Visit Diagnosis: Dysphagia, unspecified (R13.10) (Known Esophageal phase Dysmotility/Dysphagia w/ Regurgitation; GERD; h/o Dilations; deconditioning; mild Dementia; tray setup support needed)    Aspiration Risk   (reduced from an oropharyngeal phase standpoint)    Diet Recommendation   Thin;Age appropriate regular;Dysphagia 3 (mechanical soft) (chopped meats; moistened foods) = a Regular>Mech Soft diet (Cut/Chopped meats, well-moistened foods) w/ thin liquids - less straw use d/t air swallowed and No carbonated drinks during meals. General aspiration precautions. REFLUX Precautions to include Rest Breaks during meals/oral intake to allow for Esophageal clearing. HOB elevated post meals and at night when sleeping.  Medication  Administration: Whole meds with puree (as needed w/ NSG/at home)    Other Recommendations Recommended Consults: Consider GI evaluation;Consider esophageal assessment (Dietician; Palliative Care) Oral Care Recommendations: Oral care BID;Patient independent with oral care;Staff/trained caregiver to provide oral care (denture care)     Swallow Evaluation Recommendations  See above   Assistance Recommended at Discharge  Setup support at meals- intermittent  Functional Status Assessment Patient has not had a recent decline in their functional status (per oropharyngeal phases)  Frequency and Duration  (n/a)   (n/a)       Prognosis Prognosis for improved oropharyngeal function: Good Barriers to Reach Goals: Cognitive deficits;Time post onset;Severity of deficits Barriers/Prognosis Comment: Known Esophageal phase Dysmotility/Dysphagia w/ Regurgitation; GERD; h/o Dilations; deconditioning; mild Dementia; tray setup support needed      Swallow Study   General Date of Onset: 12/21/24 HPI: Pt is a 84 y.o. female with medical history significant for cerebral amyloid angiopathy, mild Alzheimer's Dementia, Falls, pneumonia w/ concern for REFLUX aspiration, Schatzki's ring dilated in May 2025, hiatal hernia, CLL in remission, DM, HTN, whose last hospitalization was in September 2025 with severe erosive esophagitis presenting with epigastric pain and vomiting, discharged on sucralfate , and lifelong PPI, with improvement on repeat EGD 11/2024 (grade A esophagitis, and a previous grade D noted on EGD) being admitted with a 1 day history of intractable vomiting and inability to hold anything down.  She denies abdominal pain, fever or chills and has no cough.   CTAbd: LOWER CHEST:  No acute abnormality. Type of Study: Bedside Swallow Evaluation Previous Swallow Assessment: 07/2024 - regular diet w/ thins Diet Prior to this Study: NPO Temperature Spikes Noted: No (wbc  trending down) Respiratory Status: Room  air History of Recent Intubation: No Behavior/Cognition: Alert;Cooperative;Pleasant mood;Confused;Distractible;Requires cueing (mild Dementia per chart) Oral Cavity Assessment: Within Functional Limits Oral Care Completed by SLP: Yes Oral Cavity - Dentition: Dentures, top;Adequate natural dentition (natural lower) Vision: Functional for self-feeding Self-Feeding Abilities: Able to feed self;Needs set up Patient Positioning: Upright in bed (supported) Baseline Vocal Quality: Normal Volitional Cough: Strong Volitional Swallow: Able to elicit    Oral/Motor/Sensory Function Overall Oral Motor/Sensory Function: Within functional limits   Ice Chips Ice chips: Within functional limits Presentation: Spoon (fed; 2 trials)   Thin Liquid Thin Liquid: Within functional limits Presentation: Cup;Self Fed;Straw (~6 ozs total) Other Comments: water, juice    Nectar Thick Nectar Thick Liquid: Not tested   Honey Thick Honey Thick Liquid: Not tested   Puree Puree: Within functional limits Presentation: Spoon;Self Fed (8 trials)   Solid     Solid: Within functional limits (moistened) Presentation: Self Fed (9+ trials)        Comer Portugal, MS, CCC-SLP Speech Language Pathologist Rehab Services; [facility] [place] [phone] (ascom) Mansur Patti 12/22/2024,5:43 PM

## 2024-12-22 NOTE — Consult Note (Signed)
 "      Melissa Copping, MD [facility]  [address], [address] [place], [place] [address] Phone: [phone] Fax : [phone]  Consultation  Referring Provider:     Dr. Jerelene Primary Care Physician:  Zachary Idelia LABOR, MD Primary Gastroenterologist:  [facility]         Reason for Consultation:     Intractable nausea vomiting  Date of Admission:  12/21/2024 Date of Consultation:  12/22/2024         HPI:   Melissa Curtis is a 84 y.o. female who was admitted with intractable nausea and vomiting.  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  She also has a history of CLL in remission, diabetes, hypertension and has been on a PPI and sucralfate  from last discharge patient has had multiple EGDs numbering no less than 4 over the last 4 years with the first 1 in 2022 being for dysphagia.  The patient has had 3 upper endoscopies last year one in May that showed a Schatzki's ring and was done for dysphagia.  This was done by Dr. Maryruth.  The patient then had another EGD a few months ago in September for nausea and vomiting and esophagitis was found at that time. A repeat EGD 6 weeks ago by Dr. Unk was done for evaluation of the patient's esophagitis. The patient had a CT scan of the abdomen yesterday that showed:  IMPRESSION: 1. No CT evidence of acute abdominopelvic abnormality. 2. Moderately distended gallbladder. This could be further evaluated with right upper quadrant ultrasound if felt clinically indicated. 3. Moderate stool burden throughout the colon.  A concise summary of the patient's endoscopies are:    Endoscopic History: EGD 11/10/24- Dr Unk- La Grade A esophagitis, normal stomach, normal duodenum EGD 9/25-  La grade D esophagitis, small hiatal hernia, normal stomach, normal duodenum; no h pylori dysplasia malignancy EGD 5/25- Dr Maryruth- low grade schatzki ring that was dilated, 2cm hiatal hernia,  normal exam EGD 7/22- Dr Dessa- one benign appearing moderate stenosis was dilated otherwise normal exam   Patient was seen on the seventh of this month just a few weeks ago for follow-up at her GI doctor's office with a report of vomiting after dinner the night before her visit.  The admission on August 11, 2024 was also for nausea and vomiting.   The patient reports that she was vomiting nonstop prior to admission but has not had any further vomiting.  She tolerated some p.o.'s today and denies feeling nausea at the present time.  The patient denies having any dysphagia at the present time. Speech pathologist had seen the patient and recommended a thin dysphagia 3 mechanical soft diet and recommended consideration of a GI evaluation for esophageal assessment despite having 4 EGDs in the last few years.  Past Medical History:  Diagnosis Date   Breast cancer (HCC) 1983   had double mastectomy   Cataracts, bilateral    CLL (chronic lymphocytic leukemia) (HCC)    Diabetes (HCC)    GERD (gastroesophageal reflux disease)    Hypertension    Leukocytosis 01/2014   Osteopenia     Past Surgical History:  Procedure Laterality Date   APPENDECTOMY     AUGMENTATION MAMMAPLASTY Bilateral 1983   COLONOSCOPY     by Dr. Viktoria over 5 years ago   double mastectomy with breast rescontruction  1983   ESOPHAGOGASTRODUODENOSCOPY N/A 06/06/2021   Procedure: ESOPHAGOGASTRODUODENOSCOPY (  EGD);  Surgeon: Dessa Reyes ORN, MD;  Location: [facility] ENDOSCOPY;  Service: Endoscopy;  Laterality: N/A;   ESOPHAGOGASTRODUODENOSCOPY N/A 04/19/2024   Procedure: EGD (ESOPHAGOGASTRODUODENOSCOPY);  Surgeon: Maryruth Ole DASEN, MD;  Location: [facility] ENDOSCOPY;  Service: Endoscopy;  Laterality: N/A;  DM   ESOPHAGOGASTRODUODENOSCOPY N/A 08/11/2024   Procedure: EGD (ESOPHAGOGASTRODUODENOSCOPY);  Surgeon: Unk Corinn Skiff, MD;  Location: [facility] ENDOSCOPY;  Service: Gastroenterology;  Laterality: N/A;    ESOPHAGOGASTRODUODENOSCOPY N/A 11/10/2024   Procedure: EGD (ESOPHAGOGASTRODUODENOSCOPY);  Surgeon: Unk Corinn Skiff, MD;  Location: [facility] ENDOSCOPY;  Service: Gastroenterology;  Laterality: N/A;  DM   MASTECTOMY Bilateral 1983   right breast ca mastecomy only no chemo no rad bilat implants     Prior to Admission medications  Medication Sig Start Date End Date Taking? Authorizing Provider  Accu-Chek Softclix Lancets lancets as directed Use as instructed. 04/13/21   [provider]  albuterol  (VENTOLIN  HFA) 108 (90 Base) MCG/ACT inhaler Inhale 2 puffs into the lungs every 4 (four) hours as needed. 07/31/24 07/31/25  [provider]  aspirin  EC 81 MG tablet Take 1 tablet by mouth daily. Patient not taking: Reported on 11/10/2024    [provider]  Blood Glucose Monitoring Suppl (GLUCOCOM BLOOD GLUCOSE MONITOR) DEVI See admin instructions. 02/21/22   [provider]  Calcium Carbonate-Vitamin D 600-400 MG-UNIT tablet Take 1 tablet by mouth 2 (two) times daily. 01/15/15 03/24/38  [provider]  cyanocobalamin (VITAMIN B12) 1000 MCG tablet Take 1,000 mcg by mouth daily.    [provider]  fexofenadine (ALLEGRA) 180 MG tablet Take 180 mg by mouth daily as needed. 03/05/17 03/24/38  [provider]  fluticasone  (FLONASE ) 50 MCG/ACT nasal spray Place 1 spray into both nostrils daily. 09/07/15   [provider]  furosemide  (LASIX ) 20 MG tablet Take 1 tablet (20 mg total) by mouth daily. 07/28/24   Jerelene Critchley, MD  glucose blood test strip  11/08/14   [provider]  mometasone (NASONEX) 50 MCG/ACT nasal spray Place 2 sprays into the nose. 01/01/24 12/31/24  [provider]  montelukast  (SINGULAIR ) 10 MG tablet Take 10 mg by mouth daily. 05/14/24   [provider]  Multiple Vitamins-Minerals (MULTIVITAMIN GUMMIES ADULT PO) Take 1 each by mouth daily.    [provider]  ondansetron  (ZOFRAN -ODT) 4 MG  disintegrating tablet Take 1 tablet (4 mg total) by mouth every 8 (eight) hours as needed for nausea or vomiting. 08/12/24   Wouk, Devaughn Sayres, MD  pantoprazole  (PROTONIX ) 40 MG tablet Take 1 tablet (40 mg total) by mouth 2 (two) times daily before a meal. 08/12/24 08/12/25  Wouk, Devaughn Sayres, MD  pravastatin  (PRAVACHOL ) 20 MG tablet Take 20 mg by mouth daily.    [provider]  Spacer/Aero-Holding Chambers (EASIVENT) inhaler See admin instructions. 07/31/24 07/31/25  [provider]  sucralfate  (CARAFATE ) 1 g tablet Take 1 tablet (1 g total) by mouth every 8 (eight) hours as needed. 08/12/24 08/12/25  Wouk, Devaughn Sayres, MD  traZODone  (DESYREL ) 100 MG tablet Take 1 tablet by mouth at bedtime. 03/04/24 03/04/25  [provider]  venlafaxine  XR (EFFEXOR -XR) 75 MG 24 hr capsule Take 75 mg by mouth daily with breakfast.    [provider]    Family History  Problem Relation Age of Onset   Leukemia Father 39   Non-Hodgkin's lymphoma Mother 83   Colon cancer Paternal Grandmother        age unknown   Breast cancer Cousin  paternal cancer     Social History[1]  Allergies as of 12/21/2024 - Review Complete 12/21/2024  Allergen Reaction Noted   Statins Other (See Comments) 10/03/2015   Iodine  06/16/2014   Shellfish allergy  10/03/2015   Sulfa antibiotics  06/16/2014    Review of Systems:    All systems reviewed and negative except where noted in HPI.   Physical Exam:  Vital signs in last 24 hours: Temp:  [98 F (36.7 C)-98.7 F (37.1 C)] 98 F (36.7 C) (01/21 0846) Pulse Rate:  [67-99] 67 (01/21 0846) Resp:  [16-18] 17 (01/21 0846) BP: (149-192)/(63-88) 149/63 (01/21 0846) SpO2:  [95 %-98 %] 95 % (01/21 0846) Last BM Date : 12/20/24 General:   Pleasant, cooperative in NAD Head:  Normocephalic and atraumatic. Eyes:   No icterus.   Conjunctiva pink. PERRLA. Ears:  Normal auditory acuity. Neck:  Supple; no masses or thyroidomegaly Lungs:  Respirations even and unlabored. Lungs clear to auscultation bilaterally.   No wheezes, crackles, or rhonchi.  Heart:  Regular rate and rhythm;  Without murmur, clicks, rubs or gallops Abdomen:  Soft, nondistended, nontender. Normal bowel sounds. No appreciable masses or hepatomegaly.  No rebound or guarding.  Rectal:  Not performed. Msk:  Symmetrical without gross deformities.  Extremities:  Without edema, cyanosis or clubbing. Neurologic:  Alert and oriented x3;  grossly normal neurologically. Skin:  Intact without significant lesions or rashes. Cervical Nodes:  No significant cervical adenopathy. Psych:  Alert and cooperative. Normal affect.  LAB RESULTS: Recent Labs    12/21/24 1435 12/22/24 0730  WBC 17.6* 16.9*  HGB 15.0 13.2  HCT 44.7 39.3  PLT 181 151   BMET Recent Labs    12/21/24 1435 12/22/24 0730  NA 141 139  K 4.0 3.7  CL 99 100  CO2 27 29  GLUCOSE 271* 150*  BUN 18 17  CREATININE 0.70 0.61  CALCIUM 9.9 9.4   LFT Recent Labs    12/21/24 1435  PROT 6.9  ALBUMIN 4.8  AST 16  ALT 8  ALKPHOS 76  BILITOT 1.0   PT/INR No results for input(s): LABPROT, INR in the last 72 hours.  STUDIES: CT ABDOMEN PELVIS WO CONTRAST Result Date: 12/21/2024 EXAM: CT Abdomen and Pelvis Without Contrast 12/21/2024 06:42:10 PM TECHNIQUE: CT of the abdomen and pelvis was performed without the administration of intravenous contrast. Multiplanar reformatted images are provided for review. Automated exposure control, iterative reconstruction, and/or weight-based adjustment of the mA/kV was utilized to reduce the radiation dose to as low as reasonably achievable. COMPARISON: 08/11/2024 CLINICAL HISTORY: Abdominal pain, acute, nonlocalized; Also with intractable vomiting FINDINGS: LOWER CHEST: No acute abnormality. LIVER: The liver is unremarkable. GALLBLADDER AND BILE DUCTS: Gallbladder is moderately distended. No biliary ductal dilatation. SPLEEN: No acute abnormality. PANCREAS:  No acute abnormality. ADRENAL GLANDS: No acute abnormality. KIDNEYS, URETERS AND BLADDER: No stones in the kidneys or ureters. No hydronephrosis. No perinephric or periureteral stranding. Urinary bladder is unremarkable. GI AND BOWEL: Stomach demonstrates no acute abnormality. There is no bowel obstruction. Left colonic diverticulosis. Moderate stool burden throughout the colon. PERITONEUM AND RETROPERITONEUM: No ascites. No free air. VASCULATURE: Diffuse aortic atherosclerosis. Aorta is normal in caliber. LYMPH NODES: No lymphadenopathy. REPRODUCTIVE ORGANS: No acute abnormality. BONES AND SOFT TISSUES: No acute osseous abnormality. No focal soft tissue abnormality. IMPRESSION: 1. No CT evidence of acute abdominopelvic abnormality. 2. Moderately distended gallbladder. This could be further evaluated with right upper quadrant ultrasound if felt clinically indicated. 3. Moderate stool burden  throughout the colon. Electronically signed by: Franky Crease MD 12/21/2024 06:57 PM EST RP Workstation: [id number]      Impression / Plan:   Assessment: Principal Problem:   Intractable nausea and vomiting, recurrent Active Problems:   Chronic lymphocytic leukemia (HCC)   Schatzki's ring of distal esophagus s/p dilatation 04/2024   Erosive esophagitis   Hypertensive urgency   Cerebral amyloid angiopathy (HCC)   Mild late onset Alzheimer's dementia without behavioral disturbance, psychotic disturbance, mood disturbance, or anxiety (HCC)   Melissa Curtis is a 84 y.o. y/o female with a history of intermittent nausea and vomiting with 4 EGDs over the last few years with findings of esophagitis and subsequent significant improvement of the esophagitis on a follow-up EGD.  The patient also had a Schatzki's ring for dysphagia which the patient is denying at this present time.  She states that she is not having problems with dysphagia and in fact was having vomiting which has not reoccurred since admission.  She is  tolerating p.o.'s.  Plan:  The patient has been told that there are multiple causes of nausea and vomiting including infection, esophagitis and many central causes by irritation to the nausea center of the brain. The patient does not have any signs of dysphagia therefore a recurrence of her Schatzki's ring is unlikely. The nausea and vomiting has resolved since admission and she is now tolerating a p.o. diet which is not consistent with documented suspected esophageal dysmotility.  With this patient's advanced age of 21 it is not uncommon to have esophageal dysmotility but there is no effective treatment for dysmotility of the esophagus. Since the patient is no longer having any nausea or vomiting I would not pursue any further luminal evaluation in light of her having multiple procedures in the past which would not change management.  If a GI cause of the nausea is suspected that I would treat for the esophagitis that the patient has been found to have in the past.  Again unlikely the cause of this patient's symptoms now since her nausea and vomiting has resolved without adequate time for esophagitis to heal therefore likely a central cause of her nausea vomiting.  Thank you for involving me in the care of this patient.      LOS: 0 days   Melissa Copping, MD, MD. NOLIA 12/22/2024, 3:03 PM,  Pager [phone] 7am-5pm  Check AMION for 5pm -7am coverage and on weekends   Note: This dictation was prepared with Dragon dictation along with smaller phrase technology. Any transcriptional errors that result from this process are unintentional.       [1]  Social History Tobacco Use   Smoking status: Former    Current packs/day: 0.00    Types: Cigarettes    Quit date: 12/03/1995    Years since quitting: 29.0   Smokeless tobacco: Never  Vaping Use   Vaping status: Never Used  Substance Use Topics   Alcohol use: No    Alcohol/week: 0.0 standard drinks of alcohol   Drug use: No   "

## 2024-12-22 NOTE — Plan of Care (Signed)
  Problem: Coping: Goal: Ability to adjust to condition or change in health will improve Outcome: Progressing   Problem: Metabolic: Goal: Ability to maintain appropriate glucose levels will improve Outcome: Progressing   Problem: Clinical Measurements: Goal: Diagnostic test results will improve Outcome: Progressing   Problem: Coping: Goal: Level of anxiety will decrease Outcome: Progressing

## 2024-12-23 ENCOUNTER — Other Ambulatory Visit: Payer: Self-pay

## 2024-12-23 DIAGNOSIS — R112 Nausea with vomiting, unspecified: Secondary | ICD-10-CM | POA: Diagnosis not present

## 2024-12-23 LAB — BASIC METABOLIC PANEL WITH GFR
Anion gap: 10 (ref 5–15)
BUN: 15 mg/dL (ref 8–23)
CO2: 26 mmol/L (ref 22–32)
Calcium: 8.9 mg/dL (ref 8.9–10.3)
Chloride: 98 mmol/L (ref 98–111)
Creatinine, Ser: 0.49 mg/dL (ref 0.44–1.00)
GFR, Estimated: >60 mL/min
Glucose, Bld: 142 mg/dL — ABNORMAL HIGH (ref 70–99)
Potassium: 3.8 mmol/L (ref 3.5–5.1)
Sodium: 134 mmol/L — ABNORMAL LOW (ref 135–145)

## 2024-12-23 LAB — PHOSPHORUS: Phosphorus: 2.6 mg/dL (ref 2.5–4.6)

## 2024-12-23 LAB — GLUCOSE, CAPILLARY
Glucose-Capillary: 143 mg/dL — ABNORMAL HIGH (ref 70–99)
Glucose-Capillary: 139 mg/dL — ABNORMAL HIGH (ref 70–99)
Glucose-Capillary: 133 mg/dL — ABNORMAL HIGH (ref 70–99)

## 2024-12-23 LAB — MAGNESIUM: Magnesium: 2.4 mg/dL (ref 1.7–2.4)

## 2024-12-23 MED ORDER — PROCHLORPERAZINE MALEATE 10 MG PO TABS
10.0000 mg | ORAL_TABLET | Freq: Four times a day (QID) | ORAL | 1 refills | Status: AC | PRN
  Filled 2024-12-23: qty 30, 8d supply, fill #0

## 2024-12-23 MED ORDER — FUROSEMIDE 20 MG PO TABS
20.0000 mg | ORAL_TABLET | Freq: Every day | ORAL | Status: DC
  Administered 2024-12-23: 20 mg via ORAL
  Filled 2024-12-23: qty 1

## 2024-12-23 MED ORDER — LOSARTAN POTASSIUM 25 MG PO TABS
25.0000 mg | ORAL_TABLET | Freq: Every day | ORAL | Status: DC
  Administered 2024-12-23: 25 mg via ORAL
  Filled 2024-12-23: qty 1

## 2024-12-23 MED ORDER — LOSARTAN POTASSIUM 25 MG PO TABS
25.0000 mg | ORAL_TABLET | Freq: Every day | ORAL | 1 refills | Status: AC
  Filled 2024-12-23: qty 30, 30d supply, fill #0

## 2024-12-23 NOTE — Care Management Obs Status (Signed)
 MEDICARE OBSERVATION STATUS NOTIFICATION   Patient Details  Name: Melissa Curtis MRN: [id number] Date of Birth: Jun 04, 1941   Medicare Observation Status Notification Given:  Chaney BRANDY CHRISTIANE LELON, CMA 12/23/2024, 1:10 PM

## 2024-12-23 NOTE — TOC Transition Note (Signed)
 Transition of Care [facility]) - Discharge Note   Patient Details  Name: Melissa Curtis MRN: [id number] Date of Birth: 10-31-1941  [facility] CM/SW Contact:  Alvaro Louder, [place] Phone Number: 12/23/2024, 2:26 PM   Clinical Narrative:   LCSWA confirmed with MD that patient is stable for discharge. LCSWA notified the patient and they are in agreement with discharge. LCSWA discussed PT recommendation of HH Patient was agreeable. LCSWA reached out to [facility] admissions coordinator an started service for patient. The patient reported that she would have her daughter come pick him up at discharge.  TOC signing off   Final next level of care: Home w Home Health Services Barriers to Discharge: No Barriers Identified   Patient Goals and CMS Choice            Discharge Placement                Patient to be transferred to facility by: Daughter Name of family member notified: Self, Daughter Patient and family notified of of transfer: 12/23/24  Discharge Plan and Services Additional resources added to the After Visit Summary for                            [facility] Arranged: PT, OT [facility] Agency: [facility] Date [facility] Agency Contacted: 12/23/24   Representative spoke with at [facility] Agency: [place]  Social Drivers of Health (SDOH) Interventions SDOH Screenings   Food Insecurity: No Food Insecurity (12/21/2024)  Housing: Low Risk (12/21/2024)  Transportation Needs: No Transportation Needs (12/21/2024)  Utilities: Not At Risk (12/21/2024)  Financial Resource Strain: Low Risk  (11/29/2024)   Received from [facility]  Social Connections: Socially Isolated (12/21/2024)  Tobacco Use: Medium Risk (12/21/2024)     Readmission Risk Interventions     No data to display

## 2024-12-23 NOTE — Plan of Care (Signed)
   Problem: Coping: Goal: Ability to adjust to condition or change in health will improve Outcome: Progressing   Problem: Nutritional: Goal: Maintenance of adequate nutrition will improve Outcome: Progressing   Problem: Skin Integrity: Goal: Risk for impaired skin integrity will decrease Outcome: Progressing

## 2024-12-23 NOTE — Evaluation (Signed)
 Physical Therapy Evaluation Patient Details Name: Melissa Curtis MRN: [id number] DOB: 16-Nov-1941 Today's Date: 12/23/2024  History of Present Illness  Melissa Curtis is a 84 y.o. female with medical history significant for Cerebral amyloid angiopathy, mild Alzheimer's dementia, aspiration pneumonia, Schatzki's ring,  CLL in remission, DM, HTN. Patient is being admitted with a 1 day history of intractable vomiting and inability to hold anything down. As of 1/22 all symptoms have resolved.  Clinical Impression  Patient seated edge of bed with OT upon arrival to room; alert and oriented to self and location, follows simple verbal commands with increased time, pleasant and cooperative throughout session.   At baseline, patient lives with granddaughter in single-level home with ramped entrance.  Ambulatory for household distances with RW, but has recently WC level as primary mobility (due to L ankle fracture, limited WBing/mobility); performing SPT with RW, +1 assist as appropriate. On evaluation, demonstrates bilat UE/LE strength and ROM grossly WFL for basic transfers and gait; L CAM boot donned throughout session (per OT, wiggles toes appropriately, good color/capillary refill to area).  Able to complete sit/stand, standing balance and basic transfers with RW, min assist +1 for safety.  Transfer performance improving with repetition throughout session; very slow and deliberate in all movement patterns, increased time for processing and initiation in all situations.  Additional gait/mobility deferred due to mobility limitations per podiatry notes. Would benefit from skilled PT to address above deficits and promote optimal return to PLOF.; recommend post-acute PT follow up as indicated by interdisciplinary care team.   '      If plan is discharge home, recommend the following: A little help with walking and/or transfers;A little help with bathing/dressing/bathroom   Can travel by private vehicle         Equipment Recommendations    Recommendations for Other Services       Functional Status Assessment Patient has had a recent decline in their functional status and demonstrates the ability to make significant improvements in function in a reasonable and predictable amount of time.     Precautions / Restrictions Precautions Precautions: Fall Recall of Precautions/Restrictions: Impaired Precaution/Restrictions Comments: boot to LLE when OOB Restrictions Weight Bearing Restrictions Per Provider Order: Yes Other Position/Activity Restrictions: Protected WBing L LE per podiatry notes, limited transfers/mobility until WBing advanced      Mobility  Bed Mobility               General bed mobility comments: seated edge of bed with OT upon arrival to room; in recliner end of session    Transfers Overall transfer level: Needs assistance Equipment used: Rolling walker (2 wheels) Transfers: Bed to chair/wheelchair/BSC, Sit to/from Stand Sit to Stand: Min assist Stand pivot transfers: Min assist         General transfer comment: performance improving with repetition throughout session; very slow and deliberate in all movement patterns, increased time for processing and initiation in all situations    Ambulation/Gait               General Gait Details: deferred due to limited transfers/mobility per podiatry notes  Stairs            Wheelchair Mobility     Tilt Bed    Modified Rankin (Stroke Patients Only)       Balance Overall balance assessment: Needs assistance Sitting-balance support: No upper extremity supported, Feet supported Sitting balance-Leahy Scale: Good     Standing balance support: Bilateral upper extremity supported, Reliant on assistive  device for balance, During functional activity Standing balance-Leahy Scale: Fair                               Pertinent Vitals/Pain Pain Assessment Pain Assessment: No/denies pain     Home Living Family/patient expects to be discharged to:: Private residence Living Arrangements: Other relatives Available Help at Discharge: Family;Available 24 hours/day Type of Home: House Home Access: Ramped entrance       Home Layout: Able to live on main level with bedroom/bathroom Home Equipment: Rolling Walker (2 wheels);Cane - single point;BSC/3in1;Shower seat;Grab bars - toilet;Grab bars - tub/shower      Prior Function Prior Level of Function : Needs assist;History of Falls (last six months)             Mobility Comments: Primarily uses w/c for transport between locations, performing SPT between seating surfaces and taking minimal steps (5-6 at at a time) with RW and +1 assist ADLs Comments: grand daughter assists with all tasks     Extremity/Trunk Assessment   Upper Extremity Assessment Upper Extremity Assessment: Generalized weakness    Lower Extremity Assessment Lower Extremity Assessment: Generalized weakness (grossly at least 3+ to 4-/5 throughout; L CAM boot donned throughout session)       Communication   Communication Communication: Impaired Factors Affecting Communication: Hearing impaired    Cognition Arousal: Alert Behavior During Therapy: WFL for tasks assessed/performed   PT - Cognitive impairments: No family/caregiver present to determine baseline                         Following commands: Impaired Following commands impaired: Follows one step commands with increased time     Cueing Cueing Techniques: Verbal cues, Tactile cues, Gestural cues     General Comments      Exercises Other Exercises Other Exercises: Toilet transfer, SPT with RW, min assist with increased time; set up/sup for peri-care in seated position; standing balance to manage clothing, cga/min assist with RW Other Exercises: Purewick removed during session to encourage/allow for additional mobility opportunity-patient continent and consistently transfers  with +1 assist-nursing team informed/aware.   Assessment/Plan    PT Assessment Patient needs continued PT services  PT Problem List Decreased range of motion;Decreased activity tolerance;Decreased balance;Decreased mobility;Decreased coordination;Decreased cognition;Decreased knowledge of use of DME;Decreased safety awareness;Decreased knowledge of precautions       PT Treatment Interventions DME instruction;Gait training;Functional mobility training;Therapeutic activities;Therapeutic exercise;Balance training;Cognitive remediation;Patient/family education    PT Goals (Current goals can be found in the Care Plan section)  Acute Rehab PT Goals Patient Stated Goal: to return home PT Goal Formulation: With patient Time For Goal Achievement: 01/06/25 Potential to Achieve Goals: Good    Frequency Min 2X/week     Co-evaluation               AM-PAC PT 6 Clicks Mobility  Outcome Measure Help needed turning from your back to your side while in a flat bed without using bedrails?: None Help needed moving from lying on your back to sitting on the side of a flat bed without using bedrails?: A Little Help needed moving to and from a bed to a chair (including a wheelchair)?: A Little Help needed standing up from a chair using your arms (e.g., wheelchair or bedside chair)?: A Little Help needed to walk in hospital room?: A Little Help needed climbing 3-5 steps with a railing? : A Lot 6  Click Score: 18    End of Session Equipment Utilized During Treatment: Gait belt Activity Tolerance: Patient tolerated treatment well Patient left: in chair;with call bell/phone within reach;with chair alarm set Nurse Communication: Mobility status PT Visit Diagnosis: Muscle weakness (generalized) (M62.81);Difficulty in walking, not elsewhere classified (R26.2)    Time: 9070-9047 PT Time Calculation (min) (ACUTE ONLY): 23 min   Charges:   PT Evaluation $PT Eval Moderate Complexity: 1 Mod   PT  General Charges $$ ACUTE PT VISIT: 1 Visit         Jaeline Whobrey H. Delores, PT, DPT, NCS 12/23/24, 1:15 PM [phone]

## 2024-12-23 NOTE — Discharge Summary (Signed)
 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   Patient admitted for further management recurrent intractable nausea and vomiting, which self resolved and tolerating regular po diet  Nausea and vomiting, resolved History of Schatzki's ring distal esophagus s/p dilatation 04/2024 History of severe erosive esophagitis(EGD 08/2024) --> Grade A esophagitis(EGD 11/2024) History of aspiration pneumonia CT abdomen and pelvis showing distended gallbladder but normal lipase and LFTs Seen by SLP, no oropharyngeal phase swallowing issues. Seen by GI, appreciate recs, no plan for luminal evaluation -symptoms have  resolved, unclear etiology Do not suspect organic cause, symptoms have self resolved in < 24 hours, tolerating regular diet Continue PPI, sucralfate  as needed, antiemetics as needed at home  Hypertensive urgency - resolved HTN BP elevated likely due to intractable vomiting and inability to tolerate meds Start low-dose losartan  25 mg, reports was on losartan  earlier but was discontinued by PCP Follow-up with PCP outpatient   Chronic HFpEF Home Lasix    Cerebral amyloid angiopathy Mild late onset Alzheimer's Continue venlafaxine  and trazodone    Chronic lymphocytic leukemia (HCC) In remission. WBC at baseline   Type 2 DM   Generalized weakness PT/OT rec Home PT/OT    Pain control - [place]  Controlled Substance Reporting System database was reviewed. and patient was instructed, not to drive, operate heavy machinery, perform activities at heights, swimming or participation in water activities or provide baby-sitting services while on Pain, Sleep and Anxiety Medications; until their outpatient Physician has advised to do so again. Also recommended to not to take more than prescribed Pain, Sleep and Anxiety Medications.  Consultants: Gastroenterology Procedures performed: None Disposition: Home with home services Diet recommendation:  Discharge Diet Orders (From admission, onward)     Start     Ordered   12/23/24 0000  Diet general       Comments: Dysphagia 3 (mechanical soft) Please CHOP all meats, and add gravies.  Chop spaghetti too.  Less salads recommended and NO carbonated drinks WITH meals.   12/23/24 1409           DISCHARGE MEDICATION: Allergies as of 12/23/2024       Reactions   Statins Other (See Comments)   Could not tolerate Liptor due to aching legs and joints; However, she has been able to tolerate pravachol    Iodine  Shellfish Allergy    Sulfa Antibiotics         Medication List     TAKE these medications    Accu-Chek Softclix Lancets  lancets as directed Use as instructed.   albuterol  108 (90 Base) MCG/ACT inhaler Commonly known as: VENTOLIN  HFA Inhale 2 puffs into the lungs every 4 (four) hours as needed.   aspirin  EC 81 MG tablet Take 1 tablet by mouth daily.   Calcium Carbonate-Vitamin D 600-400 MG-UNIT tablet Take 1 tablet by mouth 2 (two) times daily.   cyanocobalamin 1000 MCG tablet Commonly known as: VITAMIN B12 Take 1,000 mcg by mouth daily.   EasiVent inhaler See admin instructions.   fexofenadine 180 MG tablet Commonly known as: ALLEGRA Take 180 mg by mouth daily as needed.   fluticasone  50 MCG/ACT nasal spray Commonly known as: FLONASE  Place 1 spray into both nostrils daily.   furosemide  20 MG tablet Commonly known as: LASIX  Take 1 tablet (20 mg total) by mouth daily.   GlucoCom Blood Glucose Monitor Devi See admin instructions.   glucose blood test strip   losartan  25 MG tablet Commonly known as: COZAAR  Take 1 tablet (25 mg total) by mouth daily. Start taking on: December 24, 2024   mometasone 50 MCG/ACT nasal spray Commonly known as: NASONEX Place 2 sprays into the nose.   montelukast  10 MG tablet Commonly known as: SINGULAIR  Take 10 mg by mouth daily.   MULTIVITAMIN GUMMIES ADULT PO Take 1 each by mouth daily.   ondansetron  4 MG disintegrating tablet Commonly known as: ZOFRAN -ODT Take 1 tablet (4 mg total) by mouth every 8 (eight) hours as needed for nausea or vomiting.   pantoprazole  40 MG tablet Commonly known as: Protonix  Take 1 tablet (40 mg total) by mouth 2 (two) times daily before a meal.   pravastatin  20 MG tablet Commonly known as: PRAVACHOL  Take 20 mg by mouth daily.   prochlorperazine  10 MG tablet Commonly known as: COMPAZINE  Take 1 tablet (10 mg total) by mouth every 6 (six) hours as needed for nausea or vomiting.   sucralfate  1 g tablet Commonly known as: Carafate  Take 1 tablet (1 g total) by mouth every 8 (eight) hours as needed.   traZODone  100 MG  tablet Commonly known as: DESYREL  Take 1 tablet by mouth at bedtime.   venlafaxine  XR 75 MG 24 hr capsule Commonly known as: EFFEXOR -XR Take 75 mg by mouth daily with breakfast.        Discharge Exam: Filed Weights   12/21/24 1432  Weight: 54.4 kg   General exam: Appears calm and comfortable, NAD Respiratory system: No work of breathing, symmetric chest wall expansion Cardiovascular system: S1 & S2 heard, RRR.  Gastrointestinal system: Abdomen is nondistended, soft and nontender.  Neuro: Mild confusion, no focal deficits Extremities: Symmetric, expected ROM Skin: No rashes, lesions  Condition at discharge: good  The results of significant diagnostics from this hospitalization (including imaging, microbiology, ancillary and laboratory) are listed below for reference.   Imaging Studies: CT ABDOMEN PELVIS WO CONTRAST Result Date: 12/21/2024 EXAM: CT Abdomen and Pelvis Without Contrast 12/21/2024 06:42:10 PM TECHNIQUE: CT of the abdomen and pelvis was performed without the administration of intravenous contrast. Multiplanar reformatted images are provided for review. Automated exposure control, iterative reconstruction, and/or weight-based adjustment of the mA/kV was utilized to reduce the radiation dose to as low as reasonably achievable. COMPARISON: 08/11/2024 CLINICAL HISTORY: Abdominal pain, acute, nonlocalized; Also with intractable vomiting FINDINGS: LOWER CHEST: No acute abnormality. LIVER: The  liver is unremarkable. GALLBLADDER AND BILE DUCTS: Gallbladder is moderately distended. No biliary ductal dilatation. SPLEEN: No acute abnormality. PANCREAS: No acute abnormality. ADRENAL GLANDS: No acute abnormality. KIDNEYS, URETERS AND BLADDER: No stones in the kidneys or ureters. No hydronephrosis. No perinephric or periureteral stranding. Urinary bladder is unremarkable. GI AND BOWEL: Stomach demonstrates no acute abnormality. There is no bowel obstruction. Left colonic diverticulosis.  Moderate stool burden throughout the colon. PERITONEUM AND RETROPERITONEUM: No ascites. No free air. VASCULATURE: Diffuse aortic atherosclerosis. Aorta is normal in caliber. LYMPH NODES: No lymphadenopathy. REPRODUCTIVE ORGANS: No acute abnormality. BONES AND SOFT TISSUES: No acute osseous abnormality. No focal soft tissue abnormality. IMPRESSION: 1. No CT evidence of acute abdominopelvic abnormality. 2. Moderately distended gallbladder. This could be further evaluated with right upper quadrant ultrasound if felt clinically indicated. 3. Moderate stool burden throughout the colon. Electronically signed by: Franky Crease MD 12/21/2024 06:57 PM EST RP Workstation: [id number]    Microbiology: Results for orders placed or performed during the hospital encounter of 08/11/24  Culture, blood (Routine X 2) w Reflex to ID Panel     Status: None   Collection Time: 08/11/24  9:45 AM   Specimen: BLOOD LEFT ARM  Result Value Ref Range Status   Specimen Description BLOOD LEFT ARM  Final   Special Requests   Final    BOTTLES DRAWN AEROBIC ONLY Blood Culture adequate volume   Culture   Final    NO GROWTH 5 DAYS Performed at [facility], [address] [place], [place] [address]    Report Status 08/16/2024 FINAL  Final  Culture, blood (Routine X 2) w Reflex to ID Panel     Status: None   Collection Time: 08/11/24  9:45 AM   Specimen: BLOOD LEFT HAND  Result Value Ref Range Status   Specimen Description BLOOD LEFT HAND  Final   Special Requests   Final    BOTTLES DRAWN AEROBIC AND ANAEROBIC Blood Culture adequate volume   Culture   Final    NO GROWTH 5 DAYS Performed at [facility], [address] [place], [place] [address]    Report Status 08/16/2024 FINAL  Final  Resp panel by RT-PCR (RSV, Flu A&B, Covid) Anterior Nasal Swab     Status: None   Collection Time: 08/11/24  5:00 PM   Specimen: Anterior Nasal Swab  Result Value Ref Range Status   SARS Coronavirus 2 by RT PCR  NEGATIVE NEGATIVE Final    Comment: (NOTE) SARS-CoV-2 target nucleic acids are NOT DETECTED.  The SARS-CoV-2 RNA is generally detectable in upper respiratory specimens during the acute phase of infection. The lowest concentration of SARS-CoV-2 viral copies this assay can detect is 138 copies/mL. A negative result does not preclude SARS-Cov-2 infection and should not be used as the sole basis for treatment or other patient management decisions. A negative result may occur with  improper specimen collection/handling, submission of specimen other than nasopharyngeal swab, presence of viral mutation(s) within the areas targeted by this assay, and inadequate number of viral copies(<138 copies/mL). A negative result must be combined with clinical observations, patient history, and epidemiological information. The expected result is Negative.  Fact Sheet for Patients:  bloggercourse.com  Fact Sheet for Healthcare Providers:  seriousbroker.it  This test is no t yet approved or cleared by the [place]  FDA and  has been authorized for detection and/or diagnosis of SARS-CoV-2 by FDA under an Emergency Use Authorization (EUA). This EUA will remain  in effect (meaning  this test can be used) for the duration of the COVID-19 declaration under Section 564(b)(1) of the Act, 21 U.S.C.section 360bbb-3(b)(1), unless the authorization is terminated  or revoked sooner.       Influenza A by PCR NEGATIVE NEGATIVE Final   Influenza B by PCR NEGATIVE NEGATIVE Final    Comment: (NOTE) The Xpert Xpress SARS-CoV-2/FLU/RSV plus assay is intended as an aid in the diagnosis of influenza from Nasopharyngeal swab specimens and should not be used as a sole basis for treatment. Nasal washings and aspirates are unacceptable for Xpert Xpress SARS-CoV-2/FLU/RSV testing.  Fact Sheet for Patients: bloggercourse.com  Fact Sheet for  Healthcare Providers: seriousbroker.it  This test is not yet approved or cleared by the [place]  FDA and has been authorized for detection and/or diagnosis of SARS-CoV-2 by FDA under an Emergency Use Authorization (EUA). This EUA will remain in effect (meaning this test can be used) for the duration of the COVID-19 declaration under Section 564(b)(1) of the Act, 21 U.S.C. section 360bbb-3(b)(1), unless the authorization is terminated or revoked.     Resp Syncytial Virus by PCR NEGATIVE NEGATIVE Final    Comment: (NOTE) Fact Sheet for Patients: bloggercourse.com  Fact Sheet for Healthcare Providers: seriousbroker.it  This test is not yet approved or cleared by the [place]  FDA and has been authorized for detection and/or diagnosis of SARS-CoV-2 by FDA under an Emergency Use Authorization (EUA). This EUA will remain in effect (meaning this test can be used) for the duration of the COVID-19 declaration under Section 564(b)(1) of the Act, 21 U.S.C. section 360bbb-3(b)(1), unless the authorization is terminated or revoked.  Performed at [facility], [address] [place], [place] [address]     Labs: CBC: Recent Labs  Lab 12/21/24 1435 12/22/24 0730  WBC 17.6* 16.9*  HGB 15.0 13.2  HCT 44.7 39.3  MCV 96.8 98.0  PLT 181 151   Basic Metabolic Panel: Recent Labs  Lab 12/21/24 1435 12/22/24 0730 12/23/24 0421  NA 141 139 134*  K 4.0 3.7 3.8  CL 99 100 98  CO2 27 29 26   GLUCOSE 271* 150* 142*  BUN 18 17 15   CREATININE 0.70 0.61 0.49  CALCIUM 9.9 9.4 8.9  MG  --   --  2.4  PHOS  --   --  2.6   Liver Function Tests: Recent Labs  Lab 12/21/24 1435  AST 16  ALT 8  ALKPHOS 76  BILITOT 1.0  PROT 6.9  ALBUMIN 4.8   CBG: Recent Labs  Lab 12/22/24 2013 12/22/24 2346 12/23/24 0423 12/23/24 0730 12/23/24 1159  GLUCAP 162* 141* 143* 139* 133*    Discharge time  spent: greater than 30 minutes.  Signed: Laree Lock, MD [facility] 12/23/2024 "

## 2024-12-23 NOTE — Evaluation (Signed)
 Occupational Therapy Evaluation Patient Details Name: Melissa Curtis MRN: [id number] DOB: 1941-06-15 Today's Date: 12/23/2024   History of Present Illness   Melissa Curtis is a 84 y.o. female with medical history significant for Cerebral amyloid angiopathy, mild Alzheimer's dementia, aspiration pneumonia, Schatzki's ring,  CLL in remission, DM, HTN. Patient is being admitted with a 1 day history of intractable vomiting and inability to hold anything down. As of 1/22 all symptoms have resolved.     Clinical Impressions Patient was seen for OT evaluation this date. Prior to hospital admission, patient was using w/c for mobility after recent ankle fx; she is WBAT with CAM boot to LLE. Patient with dx of dementia but is A/O x 4, presents with poor/delayed processing but appropriate throughout eval. Patient lives with grand daughter who is able to assist her. She performs bed mobility with min A, SPT from EOB to recliner with mod A for lifting ans multimodal cues for weight shifting to offload LLE.  Patient presents with deficits in standing balance/tolerance and standing tolerance, affecting safe and optimal ADL completion. Patient is currently requiring mod A for LB self care tasks.  Paient would benefit from skilled OT services to address noted impairments and functional limitations (see below for any additional details) in order to maximize safety and independence while minimizing future risk of falls, injury, and readmission. Anticipate the need for follow up OT services upon acute hospital DC.      If plan is discharge home, recommend the following:   A little help with walking and/or transfers;A little help with bathing/dressing/bathroom;Assistance with cooking/housework;Direct supervision/assist for medications management;Direct supervision/assist for financial management;Assist for transportation;Supervision due to cognitive status     Functional Status Assessment   Patient has had a  recent decline in their functional status and demonstrates the ability to make significant improvements in function in a reasonable and predictable amount of time.     Equipment Recommendations   None recommended by OT     Recommendations for Other Services         Precautions/Restrictions   Precautions Precautions: Fall Recall of Precautions/Restrictions: Impaired Precaution/Restrictions Comments: boot to LLE when OOB Required Braces or Orthoses:  (CAM BOOT) Restrictions Weight Bearing Restrictions Per Provider Order: Yes     Mobility Bed Mobility Overal bed mobility: Needs Assistance Bed Mobility: Supine to Sit     Supine to sit: Min assist          Transfers Overall transfer level: Needs assistance Equipment used: Rolling walker (2 wheels) Transfers: Bed to chair/wheelchair/BSC       Step pivot transfers: Mod assist     General transfer comment: lifting A required, increased time required, cues needed for sequencing of offloading/turning      Balance Overall balance assessment: History of Falls, Needs assistance Sitting-balance support: Feet supported, No upper extremity supported Sitting balance-Leahy Scale: Good     Standing balance support: Reliant on assistive device for balance Standing balance-Leahy Scale: Fair                             ADL either performed or assessed with clinical judgement   ADL                                               Vision  Perception         Praxis         Pertinent Vitals/Pain Pain Assessment Pain Assessment: No/denies pain     Extremity/Trunk Assessment Upper Extremity Assessment Upper Extremity Assessment: Generalized weakness   Lower Extremity Assessment Lower Extremity Assessment: Defer to PT evaluation       Communication Communication Communication: Impaired Factors Affecting Communication: Hearing impaired   Cognition Arousal:  Alert Behavior During Therapy: WFL for tasks assessed/performed Cognition: History of cognitive impairments, Cognition impaired     Awareness: Online awareness impaired       OT - Cognition Comments: slow processing                 Following commands: Impaired Following commands impaired: Follows one step commands with increased time     Cueing  General Comments   Cueing Techniques: Verbal cues;Tactile cues;Gestural cues      Exercises     Shoulder Instructions      Home Living Family/patient expects to be discharged to:: Private residence Living Arrangements: Other relatives Available Help at Discharge: Family;Available 24 hours/day Type of Home: House Home Access: Ramped entrance     Home Layout: Able to live on main level with bedroom/bathroom     Bathroom Shower/Tub: Producer, Television/film/video: Handicapped height Bathroom Accessibility: Yes How Accessible: Accessible via walker Home Equipment: Rolling Walker (2 wheels);Cane - single point;BSC/3in1;Shower seat;Grab bars - toilet;Grab bars - tub/shower          Prior Functioning/Environment Prior Level of Function : Needs assist;History of Falls (last six months)             Mobility Comments: primarily uses w/c ADLs Comments: grand daughter assists with all tasks    OT Problem List: Decreased strength;Decreased activity tolerance;Impaired balance (sitting and/or standing);Decreased cognition;Decreased safety awareness   OT Treatment/Interventions: Self-care/ADL training;Therapeutic exercise;Energy conservation;DME and/or AE instruction;Patient/family education;Balance training      OT Goals(Current goals can be found in the care plan section)   Acute Rehab OT Goals Patient Stated Goal: to go home OT Goal Formulation: With patient Time For Goal Achievement: 01/06/25 Potential to Achieve Goals: Good ADL Goals Pt Will Perform Grooming: with supervision;standing;with contact guard  assist Pt Will Perform Lower Body Dressing: with min assist;sit to/from stand Pt Will Transfer to Toilet: with min assist;bedside commode;ambulating;with contact guard assist Pt Will Perform Toileting - Clothing Manipulation and hygiene: with contact guard assist;sit to/from stand   OT Frequency:  Min 2X/week    Co-evaluation              AM-PAC OT 6 Clicks Daily Activity     Outcome Measure Help from another person eating meals?: A Little Help from another person taking care of personal grooming?: A Little Help from another person toileting, which includes using toliet, bedpan, or urinal?: A Lot Help from another person bathing (including washing, rinsing, drying)?: A Lot Help from another person to put on and taking off regular upper body clothing?: A Little Help from another person to put on and taking off regular lower body clothing?: A Lot 6 Click Score: 15   End of Session Equipment Utilized During Treatment: Gait belt;Rolling walker (2 wheels) Nurse Communication: Mobility status  Activity Tolerance: Patient tolerated treatment well Patient left: in chair;Other (comment) (with PT)  OT Visit Diagnosis: Unsteadiness on feet (R26.81);Other abnormalities of gait and mobility (R26.89);Muscle weakness (generalized) (M62.81);History of falling (Z91.81)  Time: 9082-9062 OT Time Calculation (min): 20 min Charges:  OT General Charges $OT Visit: 1 Visit OT Evaluation $OT Eval Low Complexity: 1 Low OT Treatments $Self Care/Home Management : 8-22 mins  Rogers Clause, OT/L [facility], 12/23/2024

## 2025-04-14 ENCOUNTER — Ambulatory Visit: Admitting: Nurse Practitioner

## 2025-04-14 ENCOUNTER — Other Ambulatory Visit
